# Patient Record
Sex: Female | Born: 1935 | Race: White | Hispanic: No | Smoking: Never smoker
Health system: Southern US, Community
[De-identification: ages and names within clinical notes are randomized; demographics above are authoritative.]

## PROBLEM LIST (undated history)

## (undated) DIAGNOSIS — I10 Essential (primary) hypertension: Secondary | ICD-10-CM

## (undated) HISTORY — PX: ABDOMINAL SURGERY: SHX537

## (undated) HISTORY — DX: Essential (primary) hypertension: I10

---

## 2004-09-26 ENCOUNTER — Emergency Department: Payer: Self-pay | Admitting: Emergency Medicine

## 2004-09-29 ENCOUNTER — Ambulatory Visit: Payer: Self-pay | Admitting: Emergency Medicine

## 2004-10-29 ENCOUNTER — Ambulatory Visit: Payer: Self-pay | Admitting: Gastroenterology

## 2004-12-24 ENCOUNTER — Ambulatory Visit: Payer: Self-pay | Admitting: Family Medicine

## 2005-01-09 ENCOUNTER — Ambulatory Visit: Payer: Self-pay | Admitting: Family Medicine

## 2005-11-25 ENCOUNTER — Ambulatory Visit: Payer: Self-pay | Admitting: Nurse Practitioner

## 2012-10-19 ENCOUNTER — Ambulatory Visit: Payer: Self-pay | Admitting: Ophthalmology

## 2012-10-31 ENCOUNTER — Ambulatory Visit: Payer: Self-pay | Admitting: Ophthalmology

## 2012-11-07 ENCOUNTER — Ambulatory Visit: Payer: Self-pay | Admitting: Ophthalmology

## 2014-10-24 ENCOUNTER — Ambulatory Visit
Admit: 2014-10-24 | Disposition: A | Discharge status: Home / Self Care | Payer: Self-pay | Attending: Nurse Practitioner | Admitting: Nurse Practitioner

## 2014-10-26 NOTE — Op Note (Signed)
PATIENT NAME:  Shawna GalaMASSEY, [name] MR#:  [id number] DATE OF BIRTH:  1927-12-25  DATE OF PROCEDURE:  11/07/2012  PREOPERATIVE DIAGNOSIS: Retained lens fragment, right eye.   POSTOPERATIVE DIAGNOSIS: Retained lens fragment, right eye.   PROCEDURE PERFORMED: Removal of lens fragment, right eye.   SURGEON: Maylon PeppersSteven A. Jaylynn Siefert, MD  ANESTHESIA: 4% lidocaine and 0.75% Marcaine in a 50-50 mixture with 10 units/mL of Hylenex added, given as a peribulbar.   ANESTHESIOLOGIST: Dr. Dimple Caseyice.   COMPLICATIONS: None.   ESTIMATED BLOOD LOSS: Less than 1 mL.   DESCRIPTION OF PROCEDURE: The patient was brought to the operating room and given IV sedation and a peribulbar block. She was then prepped and draped in the usual fashion. Vertical rectus muscles were imbricated using 5-0 silk sutures, bridle sutures. A paracentesis entry was made superonasally with paracentesis knife and the wound was dissected using a Barraquer sweep.  The eye handpiece was then placed into the eye and the fragment came to the tip. The Barraquer sweep was used through the paracentesis track to break the fragment into small pieces, which were evacuated through the eye handpiece. The wound was then inflated with balanced salt and tenth of a milliliter of cefuroxime was injected deep in the chamber along with balanced salt that had been placed previously. The wound was checked for leaks, none were found. The bridle sutures were removed. Two drops of Vigamox were placed in the eye. A shield was placed on the eye. The patient was discharged to the recovery area in good condition.  ____________________________ Maylon PeppersSteven A. Ranbir Chew, MD sad:sb D: 11/07/2012 14:06:14 ET    T: 11/07/2012 14:24:49 ET        JOB#: [id number] cc: Viviann SpareSteven A. Cortnee Steinmiller, MD, <Dictator> Erline LevineSTEVEN A Sol Odor MD ELECTRONICALLY SIGNED 11/14/2012 14:41

## 2014-10-26 NOTE — Op Note (Signed)
PATIENT NAME:  [name], Shawna J MR#:  [id number] DATE OF BIRTH:  Oct 13, 1927  DATE OF SURGERY:  10/31/2012  PREOPERATIVE DIAGNOSIS: Cataract, right eye.   POSTOPERATIVE DIAGNOSIS: Cataract, right eye.   PROCEDURE PERFORMED: Extracapsular cataract extraction using phacoemulsification with placement of an Alcon [id number], 21.0-diopter posterior chamber lens, serial [id number].   ANESTHESIA:  4% lidocaine and 0.75% Marcaine in a 50:50 mixture of 10 units per mL of Hylenex added, given as a peribulbar.  ANESTHESIOLOGIST:  Dr. Pernell DupreAdams.  COMPLICATIONS:  None.  ESTIMATED BLOOD LOSS:  Less than 1 mL.  DESCRIPTION OF PROCEDURE:  The patient was brought to the operating room and given a peribulbar block.  The patient was then prepped and draped in the usual fashion.  The vertical rectus muscles were imbricated using 5-0 silk sutures.  These sutures were then clamped to the sterile drapes as bridle sutures.  A limbal peritomy was performed extending two clock hours and hemostasis was obtained with cautery.  A partial-thickness scleral groove was made at the surgical limbus and dissected anteriorly in a lamellar dissection using an Alcon crescent knife.  The anterior chamber was entered superonasally with a Superblade and through the lamellar dissection with a 2.6 mm keratome.  DisCoVisc was used to replace the aqueous and a continuous tear capsulorrhexis was carried out.  Hydrodissection and hydrodelineation were carried out with balanced salt and a 27 gauge cannula.  The nucleus was rotated to confirm the effectiveness of the hydrodissection.  Phacoemulsification was carried out using a divide-and-conquer technique.  Total ultrasound time was 1 minute and 47 seconds, with an average power of 26.3%, CDE of 43.17.  Irrigation/aspiration was used to remove the residual cortex.  DisCoVisc was used to inflate the capsule and the internal incision was enlarged to 3 mm with the crescent knife.  The intraocular  lens was folded and inserted into the capsular bag using the AcrySert delivery system instead of the [employer] shooter.  Irrigation/aspiration was used to remove the residual DisCoVisc.   There was no antibiotic injected due to a PENICILLIN ALLERGY.  Miostat was injected into the anterior chamber through the paracentesis track to inflate the anterior chamber and induce miosis.  The wound was checked for leaks and none were found. The conjunctiva was closed with cautery and the bridle sutures were removed.  Two drops of 0.3% Vigamox were placed on the eye.   An eye shield was placed on the eye.     ____________________________ Shawna PeppersSteven A. Riely Baskett, MD sad:dm D: 10/31/2012 14:14:21 ET T: 10/31/2012 15:33:38 ET JOB#: [id number]  cc: Shawna SpareSteven A. Rihan Schueler, MD, <Dictator> Erline LevineSTEVEN A Zavian Slowey MD ELECTRONICALLY SIGNED 11/07/2012 9:48

## 2015-02-21 ENCOUNTER — Other Ambulatory Visit: Payer: Self-pay | Admitting: Nurse Practitioner

## 2015-02-21 DIAGNOSIS — M25512 Pain in left shoulder: Secondary | ICD-10-CM

## 2015-02-26 ENCOUNTER — Ambulatory Visit: Payer: Medicare Other

## 2015-02-28 ENCOUNTER — Ambulatory Visit
Admission: RE | Admit: 2015-02-28 | Discharge: 2015-02-28 | Disposition: A | Discharge status: Home / Self Care | Payer: Medicare Other | Source: Ambulatory Visit | Attending: Nurse Practitioner | Admitting: Nurse Practitioner

## 2015-02-28 DIAGNOSIS — M7552 Bursitis of left shoulder: Secondary | ICD-10-CM | POA: Insufficient documentation

## 2015-02-28 DIAGNOSIS — M75122 Complete rotator cuff tear or rupture of left shoulder, not specified as traumatic: Secondary | ICD-10-CM | POA: Diagnosis not present

## 2015-02-28 DIAGNOSIS — M25512 Pain in left shoulder: Secondary | ICD-10-CM | POA: Insufficient documentation

## 2015-02-28 DIAGNOSIS — M19012 Primary osteoarthritis, left shoulder: Secondary | ICD-10-CM | POA: Diagnosis not present

## 2020-02-29 ENCOUNTER — Other Ambulatory Visit: Payer: Self-pay

## 2020-02-29 ENCOUNTER — Emergency Department: Payer: Medicare Other

## 2020-02-29 ENCOUNTER — Emergency Department
Admission: EM | Admit: 2020-02-29 | Discharge: 2020-02-29 | Disposition: A | Discharge status: Home / Self Care | Payer: Medicare Other | Attending: Emergency Medicine | Admitting: Emergency Medicine

## 2020-02-29 DIAGNOSIS — U071 COVID-19: Secondary | ICD-10-CM | POA: Diagnosis not present

## 2020-02-29 DIAGNOSIS — I10 Essential (primary) hypertension: Secondary | ICD-10-CM | POA: Insufficient documentation

## 2020-02-29 DIAGNOSIS — R05 Cough: Secondary | ICD-10-CM | POA: Diagnosis present

## 2020-02-29 LAB — COMPREHENSIVE METABOLIC PANEL
ALT: 19 U/L (ref 0–44)
AST: 27 U/L (ref 15–41)
Albumin: 3.2 g/dL — ABNORMAL LOW (ref 3.5–5.0)
Alkaline Phosphatase: 61 U/L (ref 38–126)
Anion gap: 16 — ABNORMAL HIGH (ref 5–15)
BUN: 15 mg/dL (ref 8–23)
CO2: 27 mmol/L (ref 22–32)
Calcium: 8.9 mg/dL (ref 8.9–10.3)
Chloride: 94 mmol/L — ABNORMAL LOW (ref 98–111)
Creatinine, Ser: 0.83 mg/dL (ref 0.44–1.00)
GFR calc Af Amer: >60 mL/min (ref >60)
GFR calc non Af Amer: >60 mL/min (ref >60)
Glucose, Bld: 130 mg/dL — ABNORMAL HIGH (ref 70–99)
Potassium: 3.1 mmol/L — ABNORMAL LOW (ref 3.5–5.1)
Sodium: 137 mmol/L (ref 135–145)
Total Bilirubin: 1.1 mg/dL (ref 0.3–1.2)
Total Protein: 8 g/dL (ref 6.5–8.1)

## 2020-02-29 LAB — CBC WITH DIFFERENTIAL/PLATELET
Abs Immature Granulocytes: 0.08 10*3/uL — ABNORMAL HIGH (ref 0.00–0.07)
Basophils Absolute: 0 10*3/uL (ref 0.0–0.1)
Basophils Relative: 0 %
Eosinophils Absolute: 0.1 10*3/uL (ref 0.0–0.5)
Eosinophils Relative: 1 %
HCT: 33.4 % — ABNORMAL LOW (ref 36.0–46.0)
Hemoglobin: 11 g/dL — ABNORMAL LOW (ref 12.0–15.0)
Immature Granulocytes: 1 %
Lymphocytes Relative: 9 %
Lymphs Abs: 0.8 10*3/uL (ref 0.7–4.0)
MCH: 27.9 pg (ref 26.0–34.0)
MCHC: 32.9 g/dL (ref 30.0–36.0)
MCV: 84.8 fL (ref 80.0–100.0)
Monocytes Absolute: 0.6 10*3/uL (ref 0.1–1.0)
Monocytes Relative: 7 %
Neutro Abs: 7.3 10*3/uL (ref 1.7–7.7)
Neutrophils Relative %: 82 %
Platelets: 541 10*3/uL — ABNORMAL HIGH (ref 150–400)
RBC: 3.94 MIL/uL (ref 3.87–5.11)
RDW: 12.2 % (ref 11.5–15.5)
WBC: 9 10*3/uL (ref 4.0–10.5)
nRBC: 0 % (ref 0.0–0.2)

## 2020-02-29 LAB — TROPONIN I (HIGH SENSITIVITY): Troponin I (High Sensitivity): 12 ng/L (ref <18)

## 2020-02-29 LAB — SARS CORONAVIRUS 2 BY RT PCR (HOSPITAL ORDER, PERFORMED IN ~~LOC~~ HOSPITAL LAB): SARS Coronavirus 2: POSITIVE — AB

## 2020-02-29 MED ORDER — DOXYCYCLINE MONOHYDRATE 100 MG PO TABS: 100.0000 mg | ORAL_TABLET | Freq: Two times a day (BID) | ORAL | 0 refills | Status: AC

## 2020-02-29 MED ORDER — SODIUM CHLORIDE 0.9 % IV SOLN
1.0000 g | Freq: Once | INTRAVENOUS | Status: AC
  Administered 2020-02-29: 1 g via INTRAVENOUS
  Filled 2020-02-29: qty 10

## 2020-02-29 MED ORDER — ALBUTEROL SULFATE HFA 108 (90 BASE) MCG/ACT IN AERS: 2.0000 | INHALATION_SPRAY | Freq: Four times a day (QID) | RESPIRATORY_TRACT | 2 refills | Status: DC | PRN

## 2020-02-29 MED ORDER — AMOXICILLIN-POT CLAVULANATE 875-125 MG PO TABS: 1.0000 | ORAL_TABLET | Freq: Two times a day (BID) | ORAL | 0 refills | Status: AC

## 2020-02-29 NOTE — ED Provider Notes (Signed)
Emergency Department Provider Note  ____________________________________________  Time seen: Approximately 8:19 PM  I have reviewed the triage vital signs and the nursing notes.   HISTORY  Chief Complaint Cough   Historian Patient     HPI Shawna Hopkins is a 86 y.o. female with a history of hypertension, presents to the emergency department with nonproductive cough and body aches for the past 2 weeks.  Patient states that her son recently had Covid.  She states that she has had some diarrhea but no vomiting.  She states that she only experiences body aches with cough.  She states that she feels like her cough is improving.  She denies fever and chills at home.  No current chest pain, chest tightness or abdominal pain.   Past Medical History:  Diagnosis Date   Hypertension      Immunizations up to date:  Yes.     Past Medical History:  Diagnosis Date   Hypertension     There are no problems to display for this patient.   Past Surgical History:  Procedure Laterality Date   ABDOMINAL SURGERY     Galbladder    Prior to Admission medications   Medication Sig Start Date End Date Taking? Authorizing Provider  albuterol (VENTOLIN HFA) 108 (90 Base) MCG/ACT inhaler Inhale 2 puffs into the lungs every 6 (six) hours as needed for wheezing or shortness of breath. 02/29/20   Orvil Feil, PA-C  amoxicillin-clavulanate (AUGMENTIN) 875-125 MG tablet Take 1 tablet by mouth 2 (two) times daily for 10 days. 02/29/20 03/10/20  Orvil Feil, PA-C  doxycycline (ADOXA) 100 MG tablet Take 1 tablet (100 mg total) by mouth 2 (two) times daily for 7 days. 02/29/20 03/07/20  Orvil Feil, PA-C    Allergies Patient has no known allergies.  No family history on file.  Social History Social History   Tobacco Use   Smoking status: Never Smoker   Smokeless tobacco: Never Used  Substance Use Topics   Alcohol use: Not Currently   Drug use: Never     Review of  Systems  Constitutional: No fever/chills Eyes:  No discharge ENT: No upper respiratory complaints. Respiratory: Patient has cough.  Gastrointestinal:   No nausea, no vomiting.  No diarrhea.  No constipation. Musculoskeletal: Patient has body aches.  Skin: Negative for rash, abrasions, lacerations, ecchymosis.    ____________________________________________   PHYSICAL EXAM:  VITAL SIGNS: ED Triage Vitals  Enc Vitals Group     BP 02/29/20 1544 139/77     Pulse Rate 02/29/20 1544 78     Resp 02/29/20 1544 20     Temp 02/29/20 1544 98.3 F (36.8 C)     Temp Source 02/29/20 1544 Oral     SpO2 02/29/20 1544 94 %     Weight 02/29/20 1545 130 lb (59 kg)     Height 02/29/20 1545 5\' 5"  (1.651 m)     Head Circumference --      Peak Flow --      Pain Score 02/29/20 1545 0     Pain Loc --      Pain Edu? --      Excl. in GC? --      Constitutional: Alert and oriented. Patient is lying supine. Eyes: Conjunctivae are normal. PERRL. EOMI. Head: Atraumatic. ENT:      Ears: Tympanic membranes are mildly injected with mild effusion bilaterally.       Nose: No congestion/rhinnorhea.      Mouth/Throat: Mucous  membranes are moist. Posterior pharynx is mildly erythematous.  Hematological/Lymphatic/Immunilogical: No cervical lymphadenopathy.  Cardiovascular: Normal rate, regular rhythm. Normal S1 and S2.  Good peripheral circulation. Respiratory: Normal respiratory effort without tachypnea or retractions. Lungs CTAB. Good air entry to the bases with no decreased or absent breath sounds. Gastrointestinal: Bowel sounds 4 quadrants. Soft and nontender to palpation. No guarding or rigidity. No palpable masses. No distention. No CVA tenderness. Musculoskeletal: Full range of motion to all extremities. No gross deformities appreciated. Neurologic:  Normal speech and language. No gross focal neurologic deficits are appreciated.  Skin:  Skin is warm, dry and intact. No rash noted. Psychiatric:  Mood and affect are normal. Speech and behavior are normal. Patient exhibits appropriate insight and judgement.    ____________________________________________   LABS (all labs ordered are listed, but only abnormal results are displayed)  Labs Reviewed  SARS CORONAVIRUS 2 BY RT PCR (HOSPITAL ORDER, PERFORMED IN [facility]) - Abnormal; Notable for the following components:      Result Value   SARS Coronavirus 2 POSITIVE (*)    All other components within normal limits  CBC WITH DIFFERENTIAL/PLATELET - Abnormal; Notable for the following components:   Hemoglobin 11.0 (*)    HCT 33.4 (*)    Platelets 541 (*)    Abs Immature Granulocytes 0.08 (*)    All other components within normal limits  COMPREHENSIVE METABOLIC PANEL - Abnormal; Notable for the following components:   Potassium 3.1 (*)    Chloride 94 (*)    Glucose, Bld 130 (*)    Albumin 3.2 (*)    Anion gap 16 (*)    All other components within normal limits  URINALYSIS, COMPLETE (UACMP) WITH MICROSCOPIC  TROPONIN I (HIGH SENSITIVITY)  TROPONIN I (HIGH SENSITIVITY)   ____________________________________________  EKG   ____________________________________________  RADIOLOGY Geraldo Pitter, personally viewed and evaluated these images (plain radiographs) as part of my medical decision making, as well as reviewing the written report by the radiologist.  DG Chest 1 View  Result Date: 02/29/2020 CLINICAL DATA:  Cough, fever EXAM: CHEST  1 VIEW COMPARISON:  None. FINDINGS: Heart is normal size. Patchy bilateral mid and lower lung airspace opacities compatible with pneumonia. No effusions. No acute bony abnormality. Aortic atherosclerosis. IMPRESSION: Patchy bilateral mid and lower lung airspace opacities compatible with pneumonia. Electronically Signed   By: Charlett Nose M.D.   On: 02/29/2020 17:16    ____________________________________________    PROCEDURES  Procedure(s) performed:      Procedures     Medications  cefTRIAXone (ROCEPHIN) 1 g in sodium chloride 0.9 % 100 mL IVPB (0 g Intravenous Stopped 02/29/20 2217)     ____________________________________________   INITIAL IMPRESSION / ASSESSMENT AND PLAN / ED COURSE  Pertinent labs & imaging results that were available during my care of the patient were reviewed by me and considered in my medical decision making (see chart for details).      Assessment and plan:  Cough:  Body aches:  86 year old female presents to the emergency department with persistent cough for the past 2 weeks.  Vital signs were reassuring at triage.  On physical exam, patient was alert and active.  She had no increased work of breathing and no adventitious lung sounds were auscultated.  CBC and CMP were reassuring without leukocytosis.  Troponin was within reference range.  EKG revealed no apparent arrhythmia or ST segment elevation.  Patient did test positive for COVID-19.  I discussed patient's case with  my attending, Dr. Erma Heritage who personally evaluated patient in the emergency department.  We had patient undergo an ambulatory pulse ox and patient was satting at 96% on room air with ambulation.  Patient was given IV Rocephin in the emergency department given patchy bilateral opacities visualized on chest x-ray which is concerning for post viral pneumonia given long duration of cough for the past 2 weeks.  Patient was discharged with doxycycline and Augmentin.  She was advised to complete her course of amoxicillin as directed by her primary care provider and then to start aforementioned medications prescribed during this emergency department encounter.  She was also prescribed an albuterol inhaler with recommendations to take 2 puffs every 4 hours as needed for shortness of breath.  Return precautions were given to return to the emergency department with new or worsening symptoms.  She voiced understanding and has easy access to the  emergency department should her symptoms change.   ____________________________________________  FINAL CLINICAL IMPRESSION(S) / ED DIAGNOSES  Final diagnoses:  COVID-19      NEW MEDICATIONS STARTED DURING THIS VISIT:  ED Discharge Orders         Ordered    doxycycline (ADOXA) 100 MG tablet  2 times daily        02/29/20 2244    amoxicillin-clavulanate (AUGMENTIN) 875-125 MG tablet  2 times daily        02/29/20 2244    albuterol (VENTOLIN HFA) 108 (90 Base) MCG/ACT inhaler  Every 6 hours PRN        02/29/20 2244              This chart was dictated using voice recognition software/Dragon. Despite best efforts to proofread, errors can occur which can change the meaning. Any change was purely unintentional.     Orvil Feil, PA-C 02/29/20 2311    Shaune Pollack, MD 03/05/20 [phone]

## 2020-02-29 NOTE — ED Notes (Signed)
Pt ambulated with pulse ox with no difficulty. Pt O2 sat started at 97% and stayed above 93% while ambulating.

## 2020-02-29 NOTE — ED Provider Notes (Signed)
EKG: Normal sinus rhythm, VR 74. PR 166, QRS 88, QTc 452. Sinus rhythm with PVCs. No ST elevations or depressions.   Shaune Pollack, MD 02/29/20 2042

## 2020-02-29 NOTE — Discharge Instructions (Signed)
You will take amoxicillin until completion. The next day, you will take doxycycline twice daily for the next 7 days. You will take Augmentin twice daily for the next 7 days. You can use 2 puffs of albuterol every 4 hours as needed for shortness of breath. Please return to the emergency department with new or worsening symptoms.

## 2020-02-29 NOTE — ED Notes (Signed)
Grandson called and asked staff to call him when his grandmother was discharged so he could come get her.

## 2020-02-29 NOTE — ED Triage Notes (Signed)
Pt states he has been having flu like symptoms since the 9th but then her son came to visit and he had covid- pt states she has had both her shots- pt has been taking amoxicillin and cough medicine for bronchitis

## 2021-06-28 ENCOUNTER — Inpatient Hospital Stay: Payer: Medicare Other

## 2021-06-28 ENCOUNTER — Inpatient Hospital Stay
Admission: EM | Admit: 2021-06-28 | Discharge: 2021-06-29 | DRG: 065 | Disposition: A | Discharge status: Other Acute Inpatient Hospital | Payer: Medicare Other | Attending: Internal Medicine | Admitting: Internal Medicine

## 2021-06-28 ENCOUNTER — Emergency Department: Payer: Medicare Other

## 2021-06-28 DIAGNOSIS — Z20822 Contact with and (suspected) exposure to covid-19: Secondary | ICD-10-CM | POA: Diagnosis present

## 2021-06-28 DIAGNOSIS — I10 Essential (primary) hypertension: Secondary | ICD-10-CM | POA: Diagnosis present

## 2021-06-28 DIAGNOSIS — R29714 NIHSS score 14: Secondary | ICD-10-CM | POA: Diagnosis present

## 2021-06-28 DIAGNOSIS — Z66 Do not resuscitate: Secondary | ICD-10-CM | POA: Diagnosis present

## 2021-06-28 DIAGNOSIS — R531 Weakness: Secondary | ICD-10-CM | POA: Diagnosis present

## 2021-06-28 DIAGNOSIS — I63512 Cerebral infarction due to unspecified occlusion or stenosis of left middle cerebral artery: Secondary | ICD-10-CM | POA: Diagnosis present

## 2021-06-28 DIAGNOSIS — I4891 Unspecified atrial fibrillation: Secondary | ICD-10-CM | POA: Diagnosis present

## 2021-06-28 DIAGNOSIS — Z7902 Long term (current) use of antithrombotics/antiplatelets: Secondary | ICD-10-CM | POA: Diagnosis not present

## 2021-06-28 DIAGNOSIS — R000 Tachycardia, unspecified: Secondary | ICD-10-CM | POA: Diagnosis present

## 2021-06-28 DIAGNOSIS — I619 Nontraumatic intracerebral hemorrhage, unspecified: Secondary | ICD-10-CM | POA: Diagnosis not present

## 2021-06-28 DIAGNOSIS — I639 Cerebral infarction, unspecified: Secondary | ICD-10-CM | POA: Diagnosis not present

## 2021-06-28 DIAGNOSIS — Z7982 Long term (current) use of aspirin: Secondary | ICD-10-CM | POA: Diagnosis not present

## 2021-06-28 DIAGNOSIS — R4701 Aphasia: Secondary | ICD-10-CM | POA: Diagnosis present

## 2021-06-28 DIAGNOSIS — I69351 Hemiplegia and hemiparesis following cerebral infarction affecting right dominant side: Secondary | ICD-10-CM | POA: Diagnosis not present

## 2021-06-28 DIAGNOSIS — I6389 Other cerebral infarction: Secondary | ICD-10-CM

## 2021-06-28 LAB — URINE DRUG SCREEN, QUALITATIVE (ARMC ONLY)
Amphetamines, Ur Screen: NOT DETECTED
Barbiturates, Ur Screen: NOT DETECTED
Benzodiazepine, Ur Scrn: NOT DETECTED
Cannabinoid 50 Ng, Ur ~~LOC~~: NOT DETECTED
Cocaine Metabolite,Ur ~~LOC~~: NOT DETECTED
MDMA (Ecstasy)Ur Screen: NOT DETECTED
Methadone Scn, Ur: NOT DETECTED
Opiate, Ur Screen: NOT DETECTED
Phencyclidine (PCP) Ur S: NOT DETECTED
Tricyclic, Ur Screen: NOT DETECTED

## 2021-06-28 LAB — URINALYSIS, ROUTINE W REFLEX MICROSCOPIC
Bilirubin Urine: NEGATIVE
Glucose, UA: NEGATIVE mg/dL
Ketones, ur: 40 mg/dL — AB
Nitrite: NEGATIVE
Protein, ur: 30 mg/dL — AB
Specific Gravity, Urine: 1.015 (ref 1.005–1.030)
pH: 7 (ref 5.0–8.0)

## 2021-06-28 LAB — ETHANOL: Alcohol, Ethyl (B): <10 mg/dL

## 2021-06-28 LAB — CBC
HCT: 34.9 % — ABNORMAL LOW (ref 36.0–46.0)
Hemoglobin: 11.5 g/dL — ABNORMAL LOW (ref 12.0–15.0)
MCH: 29 pg (ref 26.0–34.0)
MCHC: 33 g/dL (ref 30.0–36.0)
MCV: 87.9 fL (ref 80.0–100.0)
Platelets: 230 K/uL (ref 150–400)
RBC: 3.97 MIL/uL (ref 3.87–5.11)
RDW: 11.9 % (ref 11.5–15.5)
WBC: 10.5 K/uL (ref 4.0–10.5)
nRBC: 0 % (ref 0.0–0.2)

## 2021-06-28 LAB — COMPREHENSIVE METABOLIC PANEL
ALT: 13 U/L (ref 0–44)
AST: 23 U/L (ref 15–41)
Albumin: 3.7 g/dL (ref 3.5–5.0)
Alkaline Phosphatase: 59 U/L (ref 38–126)
Anion gap: 10 (ref 5–15)
BUN: 24 mg/dL — ABNORMAL HIGH (ref 8–23)
CO2: 23 mmol/L (ref 22–32)
Calcium: 9 mg/dL (ref 8.9–10.3)
Chloride: 99 mmol/L (ref 98–111)
Creatinine, Ser: 0.93 mg/dL (ref 0.44–1.00)
GFR, Estimated: 58 mL/min — ABNORMAL LOW (ref >60)
Glucose, Bld: 143 mg/dL — ABNORMAL HIGH (ref 70–99)
Potassium: 3.4 mmol/L — ABNORMAL LOW (ref 3.5–5.1)
Sodium: 132 mmol/L — ABNORMAL LOW (ref 135–145)
Total Bilirubin: 0.9 mg/dL (ref 0.3–1.2)
Total Protein: 6.8 g/dL (ref 6.5–8.1)

## 2021-06-28 LAB — CBG MONITORING, ED: Glucose-Capillary: 133 mg/dL — ABNORMAL HIGH (ref 70–99)

## 2021-06-28 LAB — DIFFERENTIAL
Abs Immature Granulocytes: 0.07 10*3/uL (ref 0.00–0.07)
Basophils Absolute: 0 10*3/uL (ref 0.0–0.1)
Basophils Relative: 0 %
Eosinophils Absolute: 0 10*3/uL (ref 0.0–0.5)
Eosinophils Relative: 0 %
Immature Granulocytes: 1 %
Lymphocytes Relative: 5 %
Lymphs Abs: 0.5 10*3/uL — ABNORMAL LOW (ref 0.7–4.0)
Monocytes Absolute: 0.4 10*3/uL (ref 0.1–1.0)
Monocytes Relative: 4 %
Neutro Abs: 9.5 10*3/uL — ABNORMAL HIGH (ref 1.7–7.7)
Neutrophils Relative %: 90 %

## 2021-06-28 LAB — APTT: aPTT: 31 seconds (ref 24–36)

## 2021-06-28 LAB — RESP PANEL BY RT-PCR (FLU A&B, COVID) ARPGX2
Influenza A by PCR: NEGATIVE
Influenza B by PCR: NEGATIVE
SARS Coronavirus 2 by RT PCR: NEGATIVE

## 2021-06-28 LAB — PROTIME-INR
INR: 1.1 (ref 0.8–1.2)
Prothrombin Time: 14.1 s (ref 11.4–15.2)

## 2021-06-28 LAB — URINALYSIS, MICROSCOPIC (REFLEX)

## 2021-06-28 MED ORDER — DILTIAZEM HCL 25 MG/5ML IV SOLN
10.0000 mg | Freq: Once | INTRAVENOUS | Status: DC
  Filled 2021-06-28: qty 5

## 2021-06-28 MED ORDER — SODIUM CHLORIDE 0.9 % IV SOLN: INTRAVENOUS | Status: DC

## 2021-06-28 MED ORDER — STROKE: EARLY STAGES OF RECOVERY BOOK: Freq: Once | Status: DC

## 2021-06-28 MED ORDER — ACETAMINOPHEN 160 MG/5ML PO SOLN
650.0000 mg | ORAL | Status: DC | PRN
  Filled 2021-06-28: qty 20.3

## 2021-06-28 MED ORDER — METOPROLOL TARTRATE 5 MG/5ML IV SOLN
2.5000 mg | Freq: Four times a day (QID) | INTRAVENOUS | Status: DC | PRN
Start: 2021-06-28 — End: 2021-06-28

## 2021-06-28 MED ORDER — PANTOPRAZOLE SODIUM 40 MG PO TBEC
40.0000 mg | DELAYED_RELEASE_TABLET | Freq: Every day | ORAL | Status: DC
  Filled 2021-06-28: qty 1

## 2021-06-28 MED ORDER — DILTIAZEM HCL 25 MG/5ML IV SOLN
5.0000 mg | Freq: Once | INTRAVENOUS | Status: AC
  Administered 2021-06-28: 16:00:00 5 mg via INTRAVENOUS

## 2021-06-28 MED ORDER — ONDANSETRON HCL 4 MG/2ML IJ SOLN
INTRAMUSCULAR | Status: AC
  Administered 2021-06-28: 16:00:00 4 mg
  Filled 2021-06-28: qty 2

## 2021-06-28 MED ORDER — LACTATED RINGERS IV BOLUS
1000.0000 mL | Freq: Once | INTRAVENOUS | Status: AC
  Administered 2021-06-28: 17:00:00 1000 mL via INTRAVENOUS

## 2021-06-28 MED ORDER — ASPIRIN 81 MG PO CHEW
324.0000 mg | CHEWABLE_TABLET | Freq: Once | ORAL | Status: DC
  Filled 2021-06-28: qty 4

## 2021-06-28 MED ORDER — ACETAMINOPHEN 325 MG PO TABS: 650.0000 mg | ORAL_TABLET | ORAL | Status: DC | PRN

## 2021-06-28 MED ORDER — DILTIAZEM HCL 25 MG/5ML IV SOLN: 5.0000 mg | INTRAVENOUS | Status: DC | PRN

## 2021-06-28 MED ORDER — HYDRALAZINE HCL 20 MG/ML IJ SOLN
10.0000 mg | INTRAMUSCULAR | Status: AC
  Administered 2021-06-28: 21:00:00 10 mg via INTRAVENOUS
  Filled 2021-06-28: qty 1

## 2021-06-28 MED ORDER — CLEVIDIPINE BUTYRATE 0.5 MG/ML IV EMUL: 0.0000 mg/h | INTRAVENOUS | Status: DC

## 2021-06-28 MED ORDER — SENNOSIDES-DOCUSATE SODIUM 8.6-50 MG PO TABS: 1.0000 | ORAL_TABLET | Freq: Every evening | ORAL | Status: DC | PRN

## 2021-06-28 MED ORDER — METOPROLOL TARTRATE 5 MG/5ML IV SOLN: 2.5000 mg | Freq: Four times a day (QID) | INTRAVENOUS | Status: DC | PRN

## 2021-06-28 MED ORDER — IOHEXOL 350 MG/ML SOLN
100.0000 mL | Freq: Once | INTRAVENOUS | Status: AC | PRN
  Administered 2021-06-28: 16:00:00 100 mL via INTRAVENOUS

## 2021-06-28 MED ORDER — ACETAMINOPHEN 325 MG RE SUPP: 650.0000 mg | RECTAL | Status: DC | PRN

## 2021-06-28 NOTE — H&P (Addendum)
History and Physical    MADISSEN Hopkins WNI:[id number] DOB: 10/25/1927 DOA: 06/28/2021  PCP: [facility]  Patient coming from: code stroke  I have personally briefly reviewed patient's old medical records in [facility]  Chief Complaint: found slumped over at home with right sided weakness , left gaze deviation,and speech difficulties  HPI: Shawna Hopkins is a 87 y.o. female with medical history significant of  HTN,who presents to ed BIB ems s/p being found slumped over in a chair by family this am. Per family LKW was 11pm the night prior. Patient lives alone and is able to perform all her adls and at baseline has good cognitive function without hx of MCI or dementia. This am when patient did not answer phone, family went to her home and found her slumped over with right sided deficits and difficulty speaking.  On evaluation in ed patient was found to have left MCA cva with LVO of  left distal M2/M3 branch. Plans were made to transfer patient but no capability at [facility] and [facility] neurointerventionalist did not accept transfer based on distal nature of occlusion and age of patient making for poor risk /benefit ratio. Patient was seen by Dr Darlyn Read who thereafter recommended medical management with permissive HTN, CVA protocol. Patient was also found to have new onset afib with rvr, she was treated with low dose ccb with good effect.  ED Course:  As noted above  Labs: K3.4,  UA-abn +rbc, wbc UDS negative  Respiratory panel neg Na 132, ws 137 Review of Systems: As per HPI otherwise 10 point review of systems negative.   Past Medical History:  Diagnosis Date   Hypertension     Past Surgical History:  Procedure Laterality Date   ABDOMINAL SURGERY     Galbladder     reports that she has never smoked. She has never used smokeless tobacco. She reports that she does not currently use alcohol. She reports that she does not use drugs.  No Known Allergies  No  family history on file. [place] Prior to Admission medications   Medication Sig Start Date End Date Taking? Authorizing Provider  hydrochlorothiazide (HYDRODIURIL) 25 MG tablet Take 25 mg by mouth daily. 04/14/21  Yes [provider]  omeprazole (PRILOSEC) 20 MG capsule Take 20 mg by mouth daily. 05/12/21  Yes [provider]  albuterol (VENTOLIN HFA) 108 (90 Base) MCG/ACT inhaler Inhale 2 puffs into the lungs every 6 (six) hours as needed for wheezing or shortness of breath. 02/29/20   Orvil Feil, PA-C    Physical Exam: Vitals:   06/28/21 1720 06/28/21 1725 06/28/21 1820 06/28/21 1840  BP: (!) 154/86 (!) 138/108 (!) 144/96 (!) 159/78  Pulse: 98 87 (!) 55 82  Resp: 17 15 20 17   Temp:  99 F (37.2 C)    TempSrc:  Axillary    SpO2: 99% 99% 99% 98%  Weight:        Constitutional: NAD, calm, comfortable Vitals:   06/28/21 1720 06/28/21 1725 06/28/21 1820 06/28/21 1840  BP: (!) 154/86 (!) 138/108 (!) 144/96 (!) 159/78  Pulse: 98 87 (!) 55 82  Resp: 17 15 20 17   Temp:  99 F (37.2 C)    TempSrc:  Axillary    SpO2: 99% 99% 99% 98%  Weight:       Eyes: PERRL, lids and conjunctivae normal ENMT: Mucous membranes are dry Posterior pharynx clear of any exudate or lesions. Neck: normal, supple, no masses,  no thyromegaly Respiratory: clear to auscultation bilaterally, no wheezing, no crackles. Normal respiratory effort. No accessory muscle use.  Cardiovascular: irRegular rhythm, tachycardic ,no murmurs / rubs / gallops. No extremity edema. 2+ pedal pulses. No carotid bruits.  Abdomen: no tenderness, no masses palpated. No hepatosplenomegaly. Bowel sounds positive.  Musculoskeletal: no clubbing / cyanosis. No joint deformity upper and lower extremities. Good ROM, no contractures. Normal muscle tone.  Skin: no rashes, lesions, ulcers. No induration Neurologic: CN 2-12 grossly intact. Unable to fully assess as patient does follows comands intermittently. No deviation of gaze  noted on my exam. Unable to assess Sensation, upper extremity Strength 4/5 lower extremity able to hold L and right leg up for 2 seconds, does not withdraw from noxious stimuli on left toe but does on right toe. Follow simple commands squeeze hand but notes decrease grip strength on right vs left . Patient is aphasic  Psychiatric: unable to assess, no noted anxiety    Labs on Admission: I have personally reviewed following labs and imaging studies  CBC: Recent Labs  Lab 06/28/21 1552  WBC 10.5  NEUTROABS 9.5*  HGB 11.5*  HCT 34.9*  MCV 87.9  PLT 230   Basic Metabolic Panel: Recent Labs  Lab 06/28/21 1552  NA 132*  K 3.4*  CL 99  CO2 23  GLUCOSE 143*  BUN 24*  CREATININE 0.93  CALCIUM 9.0   GFR: CrCl cannot be calculated (Unknown ideal weight.). Liver Function Tests: Recent Labs  Lab 06/28/21 1552  AST 23  ALT 13  ALKPHOS 59  BILITOT 0.9  PROT 6.8  ALBUMIN 3.7   No results for input(s): LIPASE, AMYLASE in the last 168 hours. No results for input(s): AMMONIA in the last 168 hours. Coagulation Profile: Recent Labs  Lab 06/28/21 1552  INR 1.1   Cardiac Enzymes: No results for input(s): CKTOTAL, CKMB, CKMBINDEX, TROPONINI in the last 168 hours. BNP (last 3 results) No results for input(s): PROBNP in the last 8760 hours. HbA1C: No results for input(s): HGBA1C in the last 72 hours. CBG: Recent Labs  Lab 06/28/21 1519  GLUCAP 133*   Lipid Profile: No results for input(s): CHOL, HDL, LDLCALC, TRIG, CHOLHDL, LDLDIRECT in the last 72 hours. Thyroid Function Tests: No results for input(s): TSH, T4TOTAL, FREET4, T3FREE, THYROIDAB in the last 72 hours. Anemia Panel: No results for input(s): VITAMINB12, FOLATE, FERRITIN, TIBC, IRON, RETICCTPCT in the last 72 hours. Urine analysis:    Component Value Date/Time   COLORURINE STRAW (A) 06/28/2021 1552   APPEARANCEUR CLEAR 06/28/2021 1552   LABSPEC 1.015 06/28/2021 1552   PHURINE 7.0 06/28/2021 1552   GLUCOSEU  NEGATIVE 06/28/2021 1552   HGBUR MODERATE (A) 06/28/2021 1552   BILIRUBINUR NEGATIVE 06/28/2021 1552   KETONESUR 40 (A) 06/28/2021 1552   PROTEINUR 30 (A) 06/28/2021 1552   NITRITE NEGATIVE 06/28/2021 1552   LEUKOCYTESUR SMALL (A) 06/28/2021 1552    Radiological Exams on Admission: DG Chest Portable 1 View  Result Date: 06/28/2021 CLINICAL DATA:  New onset atrial fibrillation.  Acute stroke. EXAM: PORTABLE CHEST 1 VIEW COMPARISON:  02/29/2020 FINDINGS: Heart size upper limits of normal. Calcification and tortuosity of the aorta. Mildly prominent interstitial markings in the mid and lower lungs, improved since the study of February 29, 2020 where there was quite likely acute pneumonia. Today's markings could be chronic or indicate mild active inflammatory disease. No consolidation or collapse. No effusion. Bony structures unremarkable. IMPRESSION: No consolidation, collapse, frank edema or effusion. Slightly prominent markings at the  lung bases which may be residua of previous pneumonia. Electronically Signed   By: Paulina Fusi M.D.   On: 06/28/2021 16:28   CT HEAD CODE STROKE WO CONTRAST  Result Date: 06/28/2021 CLINICAL DATA:  Code stroke. Provided history: Neuro deficit, acute, stroke suspected; right-sided weakness. Additional history provided: Aphasia, right-sided weakness. EXAM: CT HEAD WITHOUT CONTRAST TECHNIQUE: Contiguous axial images were obtained from the base of the skull through the vertex without intravenous contrast. COMPARISON:  No pertinent prior exams available for comparison. FINDINGS: Brain: Mild generalized cerebral and cerebellar atrophy. Abnormal cortical/subcortical hypodensity with loss of gray-white differentiation within portions of the left frontal operculum and left insula, compatible with acute left MCA territory infarct. No significant mass effect at this time. No evidence of hemorrhagic conversion. Background advanced patchy and confluent hypoattenuation within the  cerebral white matter, nonspecific but compatible with chronic small vessel ischemic disease. Small chronic infarcts within the basal ganglia and likely within the left cerebellar hemisphere. No extra-axial fluid collection. No evidence of an intracranial mass. No midline shift. Vascular: Asymmetric hyperdensity of a proximal M2 left MCA vessel suspicious for endoluminal thrombus at this site (for instance as seen on series 3, image 13). Atherosclerotic calcifications. Skull: Normal. Negative for fracture or focal lesion. Sinuses/Orbits: Leftward gaze. Trace mucosal thickening within the bilateral ethmoid air cells. ASPECTS [facility] Stroke Program Early CT Score) - Ganglionic level infarction (caudate, lentiform nuclei, internal capsule, insula, M1-M3 cortex): 5 - Supraganglionic infarction (M4-M6 cortex): 2 Total score (0-10 with 10 being normal): 7 These results were called by telephone at the time of interpretation on 06/28/2021 at 3:38 pm to provider Dr. Selina Cooley, who verbally acknowledged these results. IMPRESSION: Acute left MCA territory cortical/subcortical infarct within the left frontal operculum and left insula. ASPECTS is 7. No evidence of hemorrhagic conversion. No significant mass effect at this time. Background advanced chronic small vessel ischemic changes within the cerebral white matter. Chronic infarcts within the bilateral basal ganglia and likely left cerebellar hemisphere. Mild generalized parenchymal atrophy. Electronically Signed   By: Jackey Loge D.O.   On: 06/28/2021 15:41   CT ANGIO HEAD NECK W WO CM W PERF (CODE STROKE)  Result Date: 06/28/2021 CLINICAL DATA:  Neuro deficit, acute, stroke suspected. Additional history provided: Aphasia, right-sided weakness. EXAM: CT ANGIOGRAPHY HEAD AND NECK CT PERFUSION BRAIN TECHNIQUE: Multidetector CT imaging of the head and neck was performed using the standard protocol during bolus administration of intravenous contrast. Multiplanar CT image  reconstructions and MIPs were obtained to evaluate the vascular anatomy. Carotid stenosis measurements (when applicable) are obtained utilizing NASCET criteria, using the distal internal carotid diameter as the denominator. Multiphase CT imaging of the brain was performed following IV bolus contrast injection. Subsequent parametric perfusion maps were calculated using RAPID software. CONTRAST:  OMNIPAQUE IOHEXOL 350 MG/ML SOLN COMPARISON:  Noncontrast head CT performed earlier today 06/28/2021. FINDINGS: CTA NECK FINDINGS Aortic arch: Common origin of the innominate and left common carotid arteries. Atherosclerotic plaque within the visualized aortic arch and proximal major branch vessels of the neck. No hemodynamically significant innominate or proximal subclavian artery stenosis. Right carotid system: CCA and ICA patent within the neck without stenosis or significant atherosclerotic disease. Tortuosity of the cervical ICA. Left carotid system: CCA and ICA patent within the neck without hemodynamically significant stenosis (50% or greater). Minimal atherosclerotic plaque at the CCA origin and within the proximal ICA. Tortuosity of the cervical ICA. Vertebral arteries: Vertebral arteries codominant and patent within the neck. Mild nonstenotic atherosclerotic  plaque at the origin of the right vertebral artery. Skeleton: Trace C2-C3 grade 1 retrolisthesis. Trace C7-T1 grade 1 anterolisthesis. Cervical spondylosis. C3-C4 and C4-C5 ankylosis. No acute bony abnormality or aggressive osseous lesion. Other neck: Subcentimeter nodules within the right thyroid lobe, not meeting consensus criteria for ultrasound follow-up based on size. No cervical lymphadenopathy. Upper chest: No consolidation within the imaged lung apices. Borderline enlarged right tracheobronchial lymph node measuring 11 mm in short axis (series 9, image 2). Review of the MIP images confirms the above findings CTA HEAD FINDINGS Anterior circulation:  The intracranial internal carotid arteries are patent. Nonstenotic atherosclerotic plaque within both vessels. 6 x 6 mm inferomedially projecting saccular aneurysm arising from the distal cavernous/paraclinoid left ICA (for instance as seen on series 9, image 248) (series 12, image 116). Separate 2 mm inferiorly projecting vascular protrusion arising from the paraclinoid left ICA more laterally, which may reflect an infundibulum or additional aneurysm (for instance as seen on series 9, image 251) (series 12, image 120). The M1 middle cerebral arteries are patent. No right M2 proximal branch occlusion or high-grade proximal stenosis is identified. There is occlusion of a distal M2/proximal M3 left MCA vessel (series 14, image 31) (series 10, image 56). The anterior cerebral arteries are patent. Posterior circulation: The intracranial vertebral arteries are patent. The basilar artery is patent. The posterior cerebral arteries are patent. A right posterior communicating artery is present. The left posterior communicating artery is diminutive or absent. Venous sinuses: Within the limitations of contrast timing, no convincing thrombus. Anatomic variants: As described. Review of the MIP images confirms the above findings CT Brain Perfusion Findings: CBF (<30%) Volume: 39mL Perfusion (Tmax>6.0s) volume: 68mL (within the posterior left MCA vascular territory). Mismatch Volume: 70mL Infarction Location:None identified Emergent findings discussed with Dr. Selina Cooley at 4 p.m. on 06/28/2021 by telephone. IMPRESSION: CTA neck: 1. The common carotid, internal carotid and vertebral arteries are patent within the neck without significant stenosis. Mild atherosclerotic disease within the major arterial vessels of the neck, as described. 2.  Aortic Atherosclerosis (ICD10-I70.0). 3. Partially imaged borderline enlarged right tracheobronchial mediastinal lymph node, nonspecific. Non-emergent dedicated CT imaging of the chest may be obtained  for further evaluation, as clinically warranted. CTA head: 1. Occlusion of a distal M2/proximal M3 left MCA vessel. 2. Non-stenotic atherosclerotic plaque within the intracranial internal carotid arteries, bilaterally. 3. 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 4. Adjacent 2 mm aneurysm versus infundibulum arising from the paraclinoid left ICA. CT perfusion head: The perfusion software identifies a 10 mL region of critically hypoperfused parenchyma within the posterior left MCA vascular territory ( Tmax>6 seconds threshold). The perfusion software identifies no core infarct. However, there are clear changes of acute infarction on the concurrently performed noncontrast head CT. The perfusion software reports a mismatch volume of 10 mL. Electronically Signed   By: Jackey Loge D.O.   On: 06/28/2021 16:14    EKG: Independently reviewed. Afib with rvr  Assessment/Plan Acute Left MCA infarct with LVO of M3/M4 -currently not a candidate for thrombectomy -admit to progressive care place on cva protocol  -permissive HTN x 48 hours bp goal <220/110 - asa , statin  -echo in am  -PT/SLP/OT   New Onset afib  -rate controlled with small dose ccb  -low dose bb for if bp with hold parameters for bp 140 or less -asa for now  -can start anticoagulation in 7 days at which time  asa will be d/ced  HTN Uncontrolled  -presmissive htn as noted above -holding atni-htn meds currently     DVT prophylaxis: scd Code Status: DNR Family Communication: son(Melvin Yolunda Kloos [phone] Disposition Plan: progressive care patient  expected to be admitted greater than 2 midnights  Consults called: Dr Darlyn Read neuro [phone] Admission status: progressive care   Lurline Del MD [facility]   If 7PM-7AM, please contact night-coverage [email or web address] Password [facility]  06/28/2021, 6:46 PM

## 2021-06-28 NOTE — ED Notes (Signed)
Pt presents to ED via [facility] with concerns of code stroke. Pt lives independently at home and is verbal and A&Ox4 at baseline. Per family pt was seen and spike to last night at 2300 and was found to be normal.   Pt presents with R sided weakness, facial droop, and aphasia. Pt also has R sided vision neglect. Pt presents hypertensive with HX of same. Pt denies pain to this RN. Pt is able to shake head yes or no to questions but struggles with getting words out. Pt able to squeeze hands.   Pt LVO +  Family contacted.  Initial CBG 133, pt brought right to CT, Dr. Quinn Axe met this RN and pt at CT, 60 G placed for profusion study.

## 2021-06-28 NOTE — ED Notes (Signed)
Witnessed verbal consent from pt's grandson Shawna Hopkins to allow transfer to 

## 2021-06-28 NOTE — ED Notes (Signed)
Patient transported to MRI 

## 2021-06-28 NOTE — Progress Notes (Signed)
Patient discussed with [facility] neurointerventionalist who did not accept patient for transfer because he felt the occlusion was too distal to be accessed with an appropriate risk/benefit ratio. Family updated. Will admit for stroke workup. Patient in a fib w/ RVR on arrival. When treating please exercise caution in avoiding hypotension if at all possible to reduce risk of stroke expansion.   Full consult note to follow.  Bing Neighbors, MD [facility] [phone]  If 7pm- 7am, please page neurology on call as listed in AMION.

## 2021-06-28 NOTE — Discharge Summary (Addendum)
History and Physical      Shawna Hopkins ELF:[id number] DOB: 22-Dec-1927 DOA: 06/28/2021   [facility]: [facility]    HPI: Shawna Hopkins is a 87 y.o. female with medical history significant of HTN,who presents to ED via  EMS after   being found slumped over in a chair by family this am. Per family last known well time was 11pm the night prior. Patient lives alone and is able to perform all her adls and at baseline has good cognitive function without hx of MCI or dementia. This am when patient did not answer phone, family went to her home and found her slumped over with right sided deficits and difficulty speaking.  On evaluation in ed patient was found to have left MCA cva with LVO of  left distal M2/M3 branch. Plans were made to transfer patient but no capability at [facility] and [facility] neurointerventionalist did not accept transfer based on distal nature of occlusion and age of patient making for poor risk /benefit ratio. Patient was seen by Dr Darlyn Read who thereafter recommended medical management with permissive HTN, CVA protocol. Patient was also found to have new onset A fib with RVR, she was treated with low dose ccb with good effect.  MRI with evidence of hemorrhagic conversion with mild local mass effect Report of hemorrhage provided to me from Dr. Derry Lory  request to transfer to [facility] in [place] with [facility] service in the ICU For BP control goal one dose hydralazine given and order for clavidipine infusion for tight control with goal of systolic below 160; Aspirin has been discontinued. No other anticoagulants ordered Discussed woth grandson over phone regarding plan and he is in agreement          Past Medical History:  Diagnosis Date   Hypertension             Past Surgical History:  Procedure Laterality Date   ABDOMINAL SURGERY        Galbladder       No Known Allergies   No family history on file. [place]        Prior to Admission  medications   Medication Sig Start Date End Date Taking? Authorizing Provider  hydrochlorothiazide (HYDRODIURIL) 25 MG tablet Take 25 mg by mouth daily. 04/14/21   Yes [provider]  omeprazole (PRILOSEC) 20 MG capsule Take 20 mg by mouth daily. 05/12/21   Yes [provider]  albuterol (VENTOLIN HFA) 108 (90 Base) MCG/ACT inhaler Inhale 2 puffs into the lungs every 6 (six) hours as needed for wheezing or shortness of breath. 02/29/20     Pia Mau M, PA-C    Vitals:   06/28/21 1820 06/28/21 1840 06/28/21 1900 06/28/21 2033  BP: (!) 144/96 (!) 159/78 (!) 167/112 (!) 160/68  Pulse: (!) 55 82 (!) 120   Temp:      Resp: 20 17 14    Weight:      SpO2: 99% 98% 97%   TempSrc:        Exam Eyes: PERRL, lids and conjunctivae normal ENMT: Mucous membranes are dry Neck: normal, supple, no masses, no thyromegaly Respiratory: clear to auscultation bilaterally, no wheezing, no crackles. Normal respiratory effort. No accessory muscle use.  Cardiovascular: atrial fib with rate 80-90, no murmurs / rubs / gallops. No extremity edema. 2+ pedal pulses. No carotid bruits.  Abdomen: no tenderness, no masses palpated. No hepatosplenomegaly. Bowel sounds positive.  Musculoskeletal: no clubbing / cyanosis. No joint deformity  upper and lower extremities. Good ROM, no contractures. Normal muscle tone.  Skin: no rashes, lesions, ulcers. No induration Neurologic: . Patient is aphasic, Able to follow most commands and nod to some questions. Left preferred gaze. Right upper and right lower extremity with partial drift but did not go to bed. Unable to assess sensation at this time  Psychiatric: unable to assess, no noted anxiety   Transfer to [facility] necessary for optimal services to monitor and treat intracerebral hemorrhage Manuela Schwartz NP [facility]

## 2021-06-28 NOTE — Progress Notes (Signed)
Chaplain Maggie responded to Code Stroke page to [facility]. Patient was unavailable. Contact on call chaplain for continued support.

## 2021-06-28 NOTE — ED Notes (Signed)
Family at bedside. Pt appears to be able to move all extremities at this time but is still having asphasia and not able to get her words out.

## 2021-06-28 NOTE — ED Triage Notes (Signed)
Pt to ED via [facility] from home for right sided weakness and aphasia. Pt Last known well last night at 11pm. Pt is verbal and ambulatory at baseline. Pt has notable weakness on the right side and aphasia. Pt is in NAD.

## 2021-06-28 NOTE — ED Provider Notes (Addendum)
[facility] Emergency Department Provider Note ____________________________________________   Event Date/Time   First MD Initiated Contact with Patient 06/28/21 1517     (approximate)  I have reviewed the triage vital signs and the nursing notes.  HISTORY  Chief Complaint Cerebrovascular Accident and Code Stroke   HPI Shawna Hopkins is a 87 y.o. femalewho presents to the ED for evaluation of right-sided weakness.  Chart review indicates HTN.   Patient presents from home via EMS for evaluation of right-sided weakness and aphasia.  Family spoke with her last night around 11 PM and she was reportedly at her baseline.  She was not picking up the phone earlier today, so they went to check on her and found her slumped down at the bottom of a recliner or couch.  She presents to the ED aphasic and unable to provide any relevant history.  She is normally ambulatory and conversational at baseline.  History is limited by this.  Past Medical History:  Diagnosis Date   Hypertension     There are no problems to display for this patient.   Past Surgical History:  Procedure Laterality Date   ABDOMINAL SURGERY     Galbladder    Prior to Admission medications   Medication Sig Start Date End Date Taking? Authorizing Provider  albuterol (VENTOLIN HFA) 108 (90 Base) MCG/ACT inhaler Inhale 2 puffs into the lungs every 6 (six) hours as needed for wheezing or shortness of breath. 02/29/20   Orvil Feil, PA-C    Allergies Patient has no known allergies.  No family history on file.  Social History Social History   Tobacco Use   Smoking status: Never   Smokeless tobacco: Never  Substance Use Topics   Alcohol use: Not Currently   Drug use: Never    Review of Systems  Unable to be accurately assessed due to aphasic patient. ____________________________________________   PHYSICAL EXAM:  VITAL SIGNS: Vitals:   06/28/21 1645 06/28/21 1705  BP: (!)  138/123 (!) 167/89  Pulse: (!) 36 80  Resp: 14 12  SpO2: 98% 98%     Constitutional: Alert and looking around without distress.  Follows simple commands in all 4 extremities for strength assessment, but has quite garbled speech. Eyes: Conjunctivae are normal.  Gaze deviation to the left.  Pupils midrange and PERRL. Head: Atraumatic. Nose: No congestion/rhinnorhea. Mouth/Throat: Mucous membranes are moist.  Oropharynx non-erythematous. Neck: No stridor. No cervical spine tenderness to palpation. Cardiovascular: Normal rate, regular rhythm. Grossly normal heart sounds.  Good peripheral circulation. Respiratory: Normal respiratory effort.  No retractions. Lungs CTAB. Gastrointestinal: Soft , nondistended, nontender to palpation. No CVA tenderness. Musculoskeletal: No lower extremity tenderness nor edema.  No joint effusions. No signs of acute trauma. Neurologic: Right-sided weakness is present to both arms and legs.  Some asymmetry with facial grimacing.  Aphasic and with incomprehensible speech.  Does follow simple commands in all 4 extremities. Skin:  Skin is warm, dry and intact. No rash noted. Psychiatric: Mood and affect are normal. Speech and behavior are normal. ____________________________________________   LABS (all labs ordered are listed, but only abnormal results are displayed)  Labs Reviewed  CBC - Abnormal; Notable for the following components:      Result Value   Hemoglobin 11.5 (*)    HCT 34.9 (*)    All other components within normal limits  DIFFERENTIAL - Abnormal; Notable for the following components:   Neutro Abs 9.5 (*)    Lymphs Abs 0.5 (*)  All other components within normal limits  COMPREHENSIVE METABOLIC PANEL - Abnormal; Notable for the following components:   Sodium 132 (*)    Potassium 3.4 (*)    Glucose, Bld 143 (*)    BUN 24 (*)    GFR, Estimated 58 (*)    All other components within normal limits  URINALYSIS, ROUTINE W REFLEX MICROSCOPIC -  Abnormal; Notable for the following components:   Color, Urine STRAW (*)    Hgb urine dipstick MODERATE (*)    Ketones, ur 40 (*)    Protein, ur 30 (*)    Leukocytes,Ua SMALL (*)    All other components within normal limits  URINALYSIS, MICROSCOPIC (REFLEX) - Abnormal; Notable for the following components:   Bacteria, UA RARE (*)    All other components within normal limits  CBG MONITORING, ED - Abnormal; Notable for the following components:   Glucose-Capillary 133 (*)    All other components within normal limits  RESP PANEL BY RT-PCR (FLU A&B, COVID) ARPGX2  ETHANOL  PROTIME-INR  APTT  URINE DRUG SCREEN, QUALITATIVE ([facility] ONLY)   ____________________________________________  12 Lead EKG  A. fib with RVR, rate of 121 bpm.  Normal axis.  QTC borderline prolonged at 497.  No overtly ischemic features. ____________________________________________  RADIOLOGY  ED MD interpretation: CT head reviewed by me with evidence of left acute MCA infarct  Official radiology report(s): DG Chest Portable 1 View  Result Date: 06/28/2021 CLINICAL DATA:  New onset atrial fibrillation.  Acute stroke. EXAM: PORTABLE CHEST 1 VIEW COMPARISON:  02/29/2020 FINDINGS: Heart size upper limits of normal. Calcification and tortuosity of the aorta. Mildly prominent interstitial markings in the mid and lower lungs, improved since the study of February 29, 2020 where there was quite likely acute pneumonia. Today's markings could be chronic or indicate mild active inflammatory disease. No consolidation or collapse. No effusion. Bony structures unremarkable. IMPRESSION: No consolidation, collapse, frank edema or effusion. Slightly prominent markings at the lung bases which may be residua of previous pneumonia. Electronically Signed   By: Paulina Fusi M.D.   On: 06/28/2021 16:28   CT HEAD CODE STROKE WO CONTRAST  Result Date: 06/28/2021 CLINICAL DATA:  Code stroke. Provided history: Neuro deficit, acute, stroke  suspected; right-sided weakness. Additional history provided: Aphasia, right-sided weakness. EXAM: CT HEAD WITHOUT CONTRAST TECHNIQUE: Contiguous axial images were obtained from the base of the skull through the vertex without intravenous contrast. COMPARISON:  No pertinent prior exams available for comparison. FINDINGS: Brain: Mild generalized cerebral and cerebellar atrophy. Abnormal cortical/subcortical hypodensity with loss of gray-white differentiation within portions of the left frontal operculum and left insula, compatible with acute left MCA territory infarct. No significant mass effect at this time. No evidence of hemorrhagic conversion. Background advanced patchy and confluent hypoattenuation within the cerebral white matter, nonspecific but compatible with chronic small vessel ischemic disease. Small chronic infarcts within the basal ganglia and likely within the left cerebellar hemisphere. No extra-axial fluid collection. No evidence of an intracranial mass. No midline shift. Vascular: Asymmetric hyperdensity of a proximal M2 left MCA vessel suspicious for endoluminal thrombus at this site (for instance as seen on series 3, image 13). Atherosclerotic calcifications. Skull: Normal. Negative for fracture or focal lesion. Sinuses/Orbits: Leftward gaze. Trace mucosal thickening within the bilateral ethmoid air cells. ASPECTS [facility] Stroke Program Early CT Score) - Ganglionic level infarction (caudate, lentiform nuclei, internal capsule, insula, M1-M3 cortex): 5 - Supraganglionic infarction (M4-M6 cortex): 2 Total score (0-10 with 10 being normal): 7  These results were called by telephone at the time of interpretation on 06/28/2021 at 3:38 pm to provider Dr. Selina Cooley, who verbally acknowledged these results. IMPRESSION: Acute left MCA territory cortical/subcortical infarct within the left frontal operculum and left insula. ASPECTS is 7. No evidence of hemorrhagic conversion. No significant mass effect at this  time. Background advanced chronic small vessel ischemic changes within the cerebral white matter. Chronic infarcts within the bilateral basal ganglia and likely left cerebellar hemisphere. Mild generalized parenchymal atrophy. Electronically Signed   By: Jackey Loge D.O.   On: 06/28/2021 15:41   CT ANGIO HEAD NECK W WO CM W PERF (CODE STROKE)  Result Date: 06/28/2021 CLINICAL DATA:  Neuro deficit, acute, stroke suspected. Additional history provided: Aphasia, right-sided weakness. EXAM: CT ANGIOGRAPHY HEAD AND NECK CT PERFUSION BRAIN TECHNIQUE: Multidetector CT imaging of the head and neck was performed using the standard protocol during bolus administration of intravenous contrast. Multiplanar CT image reconstructions and MIPs were obtained to evaluate the vascular anatomy. Carotid stenosis measurements (when applicable) are obtained utilizing NASCET criteria, using the distal internal carotid diameter as the denominator. Multiphase CT imaging of the brain was performed following IV bolus contrast injection. Subsequent parametric perfusion maps were calculated using RAPID software. CONTRAST:  OMNIPAQUE IOHEXOL 350 MG/ML SOLN COMPARISON:  Noncontrast head CT performed earlier today 06/28/2021. FINDINGS: CTA NECK FINDINGS Aortic arch: Common origin of the innominate and left common carotid arteries. Atherosclerotic plaque within the visualized aortic arch and proximal major branch vessels of the neck. No hemodynamically significant innominate or proximal subclavian artery stenosis. Right carotid system: CCA and ICA patent within the neck without stenosis or significant atherosclerotic disease. Tortuosity of the cervical ICA. Left carotid system: CCA and ICA patent within the neck without hemodynamically significant stenosis (50% or greater). Minimal atherosclerotic plaque at the CCA origin and within the proximal ICA. Tortuosity of the cervical ICA. Vertebral arteries: Vertebral arteries codominant and  patent within the neck. Mild nonstenotic atherosclerotic plaque at the origin of the right vertebral artery. Skeleton: Trace C2-C3 grade 1 retrolisthesis. Trace C7-T1 grade 1 anterolisthesis. Cervical spondylosis. C3-C4 and C4-C5 ankylosis. No acute bony abnormality or aggressive osseous lesion. Other neck: Subcentimeter nodules within the right thyroid lobe, not meeting consensus criteria for ultrasound follow-up based on size. No cervical lymphadenopathy. Upper chest: No consolidation within the imaged lung apices. Borderline enlarged right tracheobronchial lymph node measuring 11 mm in short axis (series 9, image 2). Review of the MIP images confirms the above findings CTA HEAD FINDINGS Anterior circulation: The intracranial internal carotid arteries are patent. Nonstenotic atherosclerotic plaque within both vessels. 6 x 6 mm inferomedially projecting saccular aneurysm arising from the distal cavernous/paraclinoid left ICA (for instance as seen on series 9, image 248) (series 12, image 116). Separate 2 mm inferiorly projecting vascular protrusion arising from the paraclinoid left ICA more laterally, which may reflect an infundibulum or additional aneurysm (for instance as seen on series 9, image 251) (series 12, image 120). The M1 middle cerebral arteries are patent. No right M2 proximal branch occlusion or high-grade proximal stenosis is identified. There is occlusion of a distal M2/proximal M3 left MCA vessel (series 14, image 31) (series 10, image 56). The anterior cerebral arteries are patent. Posterior circulation: The intracranial vertebral arteries are patent. The basilar artery is patent. The posterior cerebral arteries are patent. A right posterior communicating artery is present. The left posterior communicating artery is diminutive or absent. Venous sinuses: Within the limitations of contrast timing, no convincing  thrombus. Anatomic variants: As described. Review of the MIP images confirms the above  findings CT Brain Perfusion Findings: CBF (<30%) Volume: 0mL Perfusion (Tmax>6.0s) volume: 10mL (within the posterior left MCA vascular territory). Mismatch Volume: 10mL Infarction Location:None identified Emergent findings discussed with Dr. Selina Cooley at 4 p.m. on 06/28/2021 by telephone. IMPRESSION: CTA neck: 1. The common carotid, internal carotid and vertebral arteries are patent within the neck without significant stenosis. Mild atherosclerotic disease within the major arterial vessels of the neck, as described. 2.  Aortic Atherosclerosis (ICD10-I70.0). 3. Partially imaged borderline enlarged right tracheobronchial mediastinal lymph node, nonspecific. Non-emergent dedicated CT imaging of the chest may be obtained for further evaluation, as clinically warranted. CTA head: 1. Occlusion of a distal M2/proximal M3 left MCA vessel. 2. Non-stenotic atherosclerotic plaque within the intracranial internal carotid arteries, bilaterally. 3. 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 4. Adjacent 2 mm aneurysm versus infundibulum arising from the paraclinoid left ICA. CT perfusion head: The perfusion software identifies a 10 mL region of critically hypoperfused parenchyma within the posterior left MCA vascular territory ( Tmax>6 seconds threshold). The perfusion software identifies no core infarct. However, there are clear changes of acute infarction on the concurrently performed noncontrast head CT. The perfusion software reports a mismatch volume of 10 mL. Electronically Signed   By: Jackey Loge D.O.   On: 06/28/2021 16:14    ____________________________________________   PROCEDURES and INTERVENTIONS  Procedure(s) performed (including Critical Care):  .1-3 Lead EKG Interpretation Performed by: Delton Prairie, MD Authorized by: Delton Prairie, MD     Interpretation: abnormal     ECG rate:  121   ECG rate assessment: tachycardic     Rhythm: atrial  fibrillation     Ectopy: none     Conduction: normal   .Critical Care Performed by: Delton Prairie, MD Authorized by: Delton Prairie, MD   Critical care provider statement:    Critical care time (minutes):  30   Critical care time was exclusive of:  Separately billable procedures and treating other patients   Critical care was necessary to treat or prevent imminent or life-threatening deterioration of the following conditions:  CNS failure or compromise and cardiac failure   Critical care was time spent personally by me on the following activities:  Development of treatment plan with patient or surrogate, discussions with consultants, evaluation of patient's response to treatment, examination of patient, ordering and review of laboratory studies, ordering and review of radiographic studies, ordering and performing treatments and interventions, pulse oximetry, re-evaluation of patient's condition and review of old charts  Medications  diltiazem (CARDIZEM) injection 5 mg (has no administration in time range)  ondansetron (ZOFRAN) 4 MG/2ML injection (4 mg  Given 06/28/21 1613)  iohexol (OMNIPAQUE) 350 MG/ML injection 100 mL (100 mLs Intravenous Contrast Given 06/28/21 1548)  diltiazem (CARDIZEM) injection 5 mg (5 mg Intravenous Given 06/28/21 1614)  lactated ringers bolus 1,000 mL (1,000 mLs Intravenous Bolus 06/28/21 1638)    ____________________________________________   MDM / ED COURSE   87 year old female who lives independently presents to the ED 16 hours after LKN with evidence of an acute stroke.  Right-sided weakness and aphasia are present.  CT, CTA and CTP demonstrate evidence of distal left M2 occlusion and associated acute stroke without ICH.  She is outside the window for any thrombolytics, but would benefit from IR angiogram and possible thrombectomy.  When discussing with our neurology team, unfortunately [facility] cannot currently accommodate the patient  and so we are reaching out  to [facility] for transfer to facilitate this. Otherwise, she has evidence of new onset atrial fibrillation with RVR.  Rapid rates in the 120s-130s and she is quite hypertensive with systolics 180s-200s.  Using small aliquots, 5 mg at a time, of diltiazem IV for some improved rate control while maintaining permissive hypertension with goal systolics of at least 130-160. No evidence of gross volume overload or respiratory failure. Does have some evidence of dehydration with ketonuria and elevated BUN that we will resuscitate with gentle LR.  Clinical Course as of 06/28/21 1722  Sat Jun 28, 2021  1524 I briefly discussed with neurology, Dr. Selina Cooley, who goes to evaluate the patient in CT. [DS]  1557 Reassessed after CT and CTA when she is back in the room.  A. fib with RVR rates in the 1 teens and 120s.  This appears to be new.  Remains hypertensive with SBP 190s.  We will start with a 10 mg diltiazem bolus and see how she responds to this. [DS]  1612 Discussed with Dr. Selina Cooley. Possible transfter to [facility]. Would likely benefit from IR angiogram, and  can't currently accommodate.  Sounds like they already have someone on the table. [DS]  1615 Grandson chatting with Dr. Selina Cooley [DS]  1620 Family is agreeable to transfer for intervention [DS]  1624 Dr. Selina Cooley talking to [facility] transfer center to discuss with IR [DS]  1639 Dr. Selina Cooley still on hold with IR at [facility] [DS]  1700 Selina Cooley still on the phone [DS]  1711 Apparently [facility] declined. We'll admit here medically. Dr. Selina Cooley talking to family [DS]    Clinical Course User Index [DS] Delton Prairie, MD    ____________________________________________   FINAL CLINICAL IMPRESSION(S) / ED DIAGNOSES  Final diagnoses:  Cerebrovascular accident (CVA) due to other mechanism [facility])  Atrial fibrillation with RVR (HCC)  New onset atrial fibrillation [facility])     ED Discharge Orders     None        Jenner Rosier   Note:  This document was prepared using  Dragon voice recognition software and may include unintentional dictation errors.    Delton Prairie, MD 06/28/21 [id number]    Delton Prairie, MD 06/28/21 [phone]

## 2021-06-28 NOTE — Plan of Care (Addendum)
Brief Neuro Update:  Notified by [facility] about acute L MCA stroke along with SWI images demonstrating hemorrhage. On review of the MRI Brain, shows more than petechial hemorrhage and probably some hemorrhagic transformation.  Recs:  - Keep SBP < 160 - Repeat CTH in 6 hours - Hold Aspirin and DVT prophylaxis. - Transfer to [facility] Neuro ICU under Neurohospitalist service for close monitoring overnight.  Spoke with Steward Drone with the hospitalist team at [facility]. Also spoke with [facility] team about transfer.  Erick Blinks [facility] Pager Number [phone]

## 2021-06-28 NOTE — ED Notes (Signed)
Pt accepted to [facility] per [facility]. Call [phone] for report. Careline to transport eta 10:30

## 2021-06-28 NOTE — Consult Note (Addendum)
NEUROLOGY CONSULTATION NOTE   Date of service: June 28, 2021 Patient Name: Shawna Hopkins MRN:  [id number] DOB:  Feb 27, 1928 Reason for consult: stroke code Requesting physician: Dr. Vladimir Crofts _ _ _   _ __   _ __ _ _  __ __   _ __   __ _  History of Present Illness   87 yo woman with hx HTN BIB EMS with acute onset global aphasia, R sided weakness, L gaze deviation. LKW 2300 last night when she was last seen by family. She was found this afternoon in her home, unable to speak or follow commands, weak in R face/arm/leg. Stroke code called on arrival to ED. Patient presented outside the window for TNK. NIHSS = 14. CT head showed acute L MCA infarct with changes in L frontal operculum and L insula (ASPECTS 7). CTA showed occlusion of L distal M2 / prox M3 occlusion. CT perfusion shows 0 ml core with 37ml penumbra (core measure felt to be underestimated given changes on head CT c/w acute infarct, although the changes on head CT do not map to the exact area shown as having a perfusion deficit on CTP.)  I spoke with her 2 children Shawna Hopkins (daughter) and Shawna Hopkins (son) by phone. Shawna Hopkins deferred to her brother Shawna Hopkins [phone]). I discussed the possibility of patient being a thrombectomy candidate, and explained the risks/benefits of the procedure (risks including hemorrhage, which could be fatal). Son wishes to proceed with thrombectomy if she is a candidate. [facility] IR team already activated and en route to hospital for thrombectomy for a different patient. Given that she presented to [facility], I called [facility] transfer center and spoke with Dr. Sammuel Hines w/ neurointervention who reviewed the images and felt that patient was not a candidate 2/2 vessel being too distal (he stated that vessel was distal M3/M4).  Patient has no documented prior hx of a fib and is not on anticoagulation at home. She was in a fib w/ RVR on presentation today.  At baseline she has no neurologic deficits per  family and lives independently at age 7.  All CNS imaging personally reviewed and discussed by phone with neuroradiology Dr. Armandina Gemma.    ROS   UTA 2/2 aphasia  Past History   I have reviewed the following:  Past Medical History:  Diagnosis Date   Hypertension    Past Surgical History:  Procedure Laterality Date   ABDOMINAL SURGERY     Galbladder   No family history on file. Social History   Socioeconomic History   Marital status: Widowed    Spouse name: Not on file   Number of children: Not on file   Years of education: Not on file   Highest education level: Not on file  Occupational History   Not on file  Tobacco Use   Smoking status: Never   Smokeless tobacco: Never  Substance and Sexual Activity   Alcohol use: Not Currently   Drug use: Never   Sexual activity: Not on file  Other Topics Concern   Not on file  Social History Narrative   Not on file   Social Determinants of Health   Financial Resource Strain: Not on file  Food Insecurity: Not on file  Transportation Needs: Not on file  Physical Activity: Not on file  Stress: Not on file  Social Connections: Not on file   No Known Allergies  Medications   (Not in a hospital admission)     Current Facility-Administered Medications:  diltiazem (CARDIZEM) injection 5 mg, 5 mg, Intravenous, Q10 min PRN, Vladimir Crofts, MD  Current Outpatient Medications:    albuterol (VENTOLIN HFA) 108 (90 Base) MCG/ACT inhaler, Inhale 2 puffs into the lungs every 6 (six) hours as needed for wheezing or shortness of breath., Disp: 8 g, Rfl: 2  Vitals   Vitals:   06/28/21 1608 06/28/21 1610 06/28/21 1615 06/28/21 1620  BP: (!) 165/106 (!) 181/107 (!) 134/110 (!) 148/94  Pulse: 89 (!) 110 (!) 101 (!) 102  Resp: (!) 30 (!) 26 15 (!) 25  SpO2: 96% 98% 95%   Weight:         Body mass index is 23.66 kg/m.  Physical Exam   Physical Exam Gen: alert, unable to answer orientation questions 2/2 aphasia, unable to  follow commands CV: irregularly irregular Resp: CTAB  Neuro: *MS: alert, unable to answer orientation questions 2/2 aphasia, unable to follow commands *Speech: no intelligible speech *CN: PERRL, blinks to threat bilat, optic discs unable to be visualized 2/2 pupillary constriction, L gaze deviation on arrival, resolved by end of stroke code, no ptosis, sensation intact, R UMN facial droop, hearing intact to voice. *Motor:   Normal bulk.  No tremor, rigidity or bradykinesia. LUE and LLE full strength throughout. RUE and RLE anti-gravity with drift to bed. *Sensory: Intact to light touch, pinprick, temperature vibration throughout. Symmetric. Propioception intact bilat.  No double-simultaneous extinction.  *Coordination:  UTA  *Reflexes:  2+ and symmetric throughout without clonus; toes down-going bilat *Gait: UTA  NIHSS  1a Level of Conscious.: 0 1b LOC Questions: 2 1c LOC Commands: 2 2 Best Gaze: 2 3 Visual: 0 4 Facial Palsy: 1 5a Motor Arm - left: 0 5b Motor Arm - Right: 2 6a Motor Leg - Left: 0 6b Motor Leg - Right: 2 7 Limb Ataxia: 0 8 Sensory: 0 9 Best Language: 3 10 Dysarthria: 0 11 Extinct. and Inatten.: 0  TOTAL: 14   Premorbid mRS = 1   Labs   CBC:  Recent Labs  Lab 06/28/21 1552  WBC 10.5  NEUTROABS 9.5*  HGB 11.5*  HCT 34.9*  MCV 87.9  PLT [id number]    Basic Metabolic Panel:  Lab Results  Component Value Date   NA 137 02/29/2020   K 3.1 (L) 02/29/2020   CO2 27 02/29/2020   GLUCOSE 130 (H) 02/29/2020   BUN 15 02/29/2020   CREATININE 0.83 02/29/2020   CALCIUM 8.9 02/29/2020   GFRNONAA >60 02/29/2020   GFRAA >60 02/29/2020   Lipid Panel: No results found for: LDLCALC HgbA1c: No results found for: HGBA1C Urine Drug Screen: No results found for: LABOPIA, COCAINSCRNUR, LABBENZ, AMPHETMU, THCU, LABBARB  Alcohol Level     Component Value Date/Time   ETH <10 06/28/2021 1552     Impression   87 yo woman with hx HTN BIB EMS with acute onset global  aphasia, R sided weakness, L gaze deviation. She presented outside the window for TNK and was declined for thrombectomy by [facility] neuro-IR because they felt the clot was too distal. I had a very long discussion with son and grandson at bedside about this and they expressed understanding.  Recommendations   - Admit to hospitalist service for stroke w/u - Permissive HTN x48 hrs from sx onset goal BP <220/110. PRN labetalol or hydralazine if BP above these parameters. Avoid oral antihypertensives. After 48 hrs will gently lower BP goals. - Patient presented in a fib w/ RVR. When treating this please use every precaution  to avoid even relative hypotension (please try to maintain SBP at least >140) given her large vessel occlusion - MRI brain wo contrast - TTE  - Check A1c and LDL + add statin per guidelines - ASA 81mg  daily + plavix 75mg  daily for now. Patient will need to be evaluated for anticoagulation for secondary stroke prevention in the setting of a fib, but will hold off on this in the acute post-stroke period. MRI brain appearance will further inform timing for anticoagulation restart (likely later this hospitalization). When anticoagulation is restarted, antiplatelets should be d/c'd unless there is a cardiac indication to continue aspirin in addition. - q4 hr neuro checks - STAT head CT for any change in neuro exam - Tele - PT/OT/SLP - Stroke education - Amb referral to neurology upon discharge   Will continue to follow.   This patient was critically ill and at significant risk of neurological worsening, death and care requires constant monitoring of vital signs, hemodynamics,respiratory and cardiac monitoring, neurological assessment, discussion with family, other specialists and medical decision making of high complexity. I spent 110 minutes of neurocritical care time  in the care of  this patient. This was time spent independent of any time provided by nurse practitioner or  PA.  , MD [facility] [phone]  If 7pm- 7am, please page neurology on call as listed in AMION.

## 2021-06-28 NOTE — ED Notes (Signed)
Patient transported to CT 

## 2021-06-29 ENCOUNTER — Inpatient Hospital Stay (HOSPITAL_COMMUNITY): Payer: Medicare Other

## 2021-06-29 ENCOUNTER — Inpatient Hospital Stay (HOSPITAL_COMMUNITY)
Admission: AD | Admit: 2021-06-29 | Discharge: 2021-07-01 | DRG: 064 | Disposition: A | Discharge status: Rehabilitation Facility | Payer: Medicare Other | Source: Other Acute Inpatient Hospital | Attending: Neurology | Admitting: Neurology

## 2021-06-29 DIAGNOSIS — E785 Hyperlipidemia, unspecified: Secondary | ICD-10-CM | POA: Diagnosis present

## 2021-06-29 DIAGNOSIS — F05 Delirium due to known physiological condition: Secondary | ICD-10-CM | POA: Diagnosis present

## 2021-06-29 DIAGNOSIS — K219 Gastro-esophageal reflux disease without esophagitis: Secondary | ICD-10-CM | POA: Diagnosis present

## 2021-06-29 DIAGNOSIS — F802 Mixed receptive-expressive language disorder: Secondary | ICD-10-CM | POA: Diagnosis present

## 2021-06-29 DIAGNOSIS — I4891 Unspecified atrial fibrillation: Secondary | ICD-10-CM | POA: Diagnosis present

## 2021-06-29 DIAGNOSIS — I63512 Cerebral infarction due to unspecified occlusion or stenosis of left middle cerebral artery: Secondary | ICD-10-CM | POA: Diagnosis not present

## 2021-06-29 DIAGNOSIS — R4701 Aphasia: Secondary | ICD-10-CM | POA: Diagnosis present

## 2021-06-29 DIAGNOSIS — I69320 Aphasia following cerebral infarction: Secondary | ICD-10-CM | POA: Diagnosis not present

## 2021-06-29 DIAGNOSIS — R29713 NIHSS score 13: Secondary | ICD-10-CM | POA: Diagnosis present

## 2021-06-29 DIAGNOSIS — R531 Weakness: Secondary | ICD-10-CM | POA: Diagnosis present

## 2021-06-29 DIAGNOSIS — G4700 Insomnia, unspecified: Secondary | ICD-10-CM | POA: Diagnosis present

## 2021-06-29 DIAGNOSIS — R7303 Prediabetes: Secondary | ICD-10-CM | POA: Diagnosis present

## 2021-06-29 DIAGNOSIS — I619 Nontraumatic intracerebral hemorrhage, unspecified: Secondary | ICD-10-CM | POA: Diagnosis present

## 2021-06-29 DIAGNOSIS — R509 Fever, unspecified: Secondary | ICD-10-CM | POA: Diagnosis not present

## 2021-06-29 DIAGNOSIS — I480 Paroxysmal atrial fibrillation: Secondary | ICD-10-CM | POA: Diagnosis not present

## 2021-06-29 DIAGNOSIS — I63412 Cerebral infarction due to embolism of left middle cerebral artery: Secondary | ICD-10-CM | POA: Diagnosis present

## 2021-06-29 DIAGNOSIS — I671 Cerebral aneurysm, nonruptured: Secondary | ICD-10-CM

## 2021-06-29 DIAGNOSIS — G8191 Hemiplegia, unspecified affecting right dominant side: Secondary | ICD-10-CM | POA: Diagnosis present

## 2021-06-29 DIAGNOSIS — I483 Typical atrial flutter: Secondary | ICD-10-CM | POA: Diagnosis not present

## 2021-06-29 DIAGNOSIS — I4892 Unspecified atrial flutter: Secondary | ICD-10-CM | POA: Diagnosis present

## 2021-06-29 DIAGNOSIS — I10 Essential (primary) hypertension: Secondary | ICD-10-CM | POA: Diagnosis present

## 2021-06-29 DIAGNOSIS — Z7982 Long term (current) use of aspirin: Secondary | ICD-10-CM | POA: Diagnosis not present

## 2021-06-29 DIAGNOSIS — I69392 Facial weakness following cerebral infarction: Secondary | ICD-10-CM | POA: Diagnosis not present

## 2021-06-29 DIAGNOSIS — N390 Urinary tract infection, site not specified: Secondary | ICD-10-CM | POA: Diagnosis present

## 2021-06-29 DIAGNOSIS — I69354 Hemiplegia and hemiparesis following cerebral infarction affecting left non-dominant side: Secondary | ICD-10-CM | POA: Diagnosis not present

## 2021-06-29 LAB — HEMOGLOBIN A1C
Hgb A1c MFr Bld: 6 % — ABNORMAL HIGH (ref 4.8–5.6)
Mean Plasma Glucose: 125.5 mg/dL

## 2021-06-29 LAB — ECHOCARDIOGRAM COMPLETE
AR max vel: 1.97 cm2
AV Area VTI: 1.77 cm2
AV Area mean vel: 1.61 cm2
AV Mean grad: 3 mmHg
AV Peak grad: 5 mmHg
Ao pk vel: 1.12 m/s
Area-P 1/2: 3.08 cm2
MV VTI: 1.05 cm2
S' Lateral: 2.3 cm
Weight: 2289.26 oz

## 2021-06-29 LAB — CBC
HCT: 36.9 % (ref 36.0–46.0)
Hemoglobin: 11.9 g/dL — ABNORMAL LOW (ref 12.0–15.0)
MCH: 28.7 pg (ref 26.0–34.0)
MCHC: 32.2 g/dL (ref 30.0–36.0)
MCV: 88.9 fL (ref 80.0–100.0)
Platelets: 214 10*3/uL (ref 150–400)
RBC: 4.15 MIL/uL (ref 3.87–5.11)
RDW: 12.4 % (ref 11.5–15.5)
WBC: 8.2 10*3/uL (ref 4.0–10.5)
nRBC: 0 % (ref 0.0–0.2)

## 2021-06-29 LAB — LIPID PANEL
Cholesterol: 134 mg/dL (ref 0–200)
HDL: 55 mg/dL (ref >40)
LDL Cholesterol: 69 mg/dL (ref 0–99)
Total CHOL/HDL Ratio: 2.4 RATIO
Triglycerides: 52 mg/dL (ref <150)
VLDL: 10 mg/dL (ref 0–40)

## 2021-06-29 LAB — BASIC METABOLIC PANEL
Anion gap: 9 (ref 5–15)
BUN: 21 mg/dL (ref 8–23)
CO2: 25 mmol/L (ref 22–32)
Calcium: 9 mg/dL (ref 8.9–10.3)
Chloride: 103 mmol/L (ref 98–111)
Creatinine, Ser: 1.08 mg/dL — ABNORMAL HIGH (ref 0.44–1.00)
GFR, Estimated: 48 mL/min — ABNORMAL LOW (ref >60)
Glucose, Bld: 130 mg/dL — ABNORMAL HIGH (ref 70–99)
Potassium: 3.4 mmol/L — ABNORMAL LOW (ref 3.5–5.1)
Sodium: 137 mmol/L (ref 135–145)

## 2021-06-29 LAB — GLUCOSE, CAPILLARY
Glucose-Capillary: 114 mg/dL — ABNORMAL HIGH (ref 70–99)
Glucose-Capillary: 91 mg/dL (ref 70–99)
Glucose-Capillary: 103 mg/dL — ABNORMAL HIGH (ref 70–99)
Glucose-Capillary: 100 mg/dL — ABNORMAL HIGH (ref 70–99)

## 2021-06-29 LAB — MRSA NEXT GEN BY PCR, NASAL: MRSA by PCR Next Gen: NOT DETECTED

## 2021-06-29 MED ORDER — ONDANSETRON HCL 4 MG/2ML IJ SOLN
INTRAMUSCULAR | Status: AC
  Filled 2021-06-29: qty 2

## 2021-06-29 MED ORDER — CHLORHEXIDINE GLUCONATE CLOTH 2 % EX PADS
6.0000 | MEDICATED_PAD | Freq: Every day | CUTANEOUS | Status: DC
  Administered 2021-06-29 – 2021-06-30 (×2): 6 via TOPICAL

## 2021-06-29 MED ORDER — ACETAMINOPHEN 160 MG/5ML PO SOLN: 650.0000 mg | ORAL | Status: DC | PRN

## 2021-06-29 MED ORDER — WHITE PETROLATUM EX OINT
TOPICAL_OINTMENT | CUTANEOUS | Status: AC
  Filled 2021-06-29: qty 28.35

## 2021-06-29 MED ORDER — ACETAMINOPHEN 650 MG RE SUPP
650.0000 mg | RECTAL | Status: DC | PRN
  Administered 2021-06-29: 09:00:00 650 mg via RECTAL
  Filled 2021-06-29: qty 1

## 2021-06-29 MED ORDER — SODIUM CHLORIDE 0.9 % IV SOLN: INTRAVENOUS | Status: DC

## 2021-06-29 MED ORDER — SENNOSIDES-DOCUSATE SODIUM 8.6-50 MG PO TABS
1.0000 | ORAL_TABLET | Freq: Two times a day (BID) | ORAL | Status: DC
  Administered 2021-06-30 – 2021-07-01 (×2): 1 via ORAL
  Filled 2021-06-29 (×3): qty 1

## 2021-06-29 MED ORDER — STROKE: EARLY STAGES OF RECOVERY BOOK: Freq: Once | Status: DC

## 2021-06-29 MED ORDER — PANTOPRAZOLE SODIUM 40 MG IV SOLR
40.0000 mg | Freq: Every day | INTRAVENOUS | Status: DC
  Administered 2021-06-29 (×2): 40 mg via INTRAVENOUS
  Filled 2021-06-29 (×2): qty 40

## 2021-06-29 MED ORDER — METOPROLOL TARTRATE 5 MG/5ML IV SOLN
2.5000 mg | Freq: Four times a day (QID) | INTRAVENOUS | Status: DC | PRN
Start: 2021-06-29 — End: 2021-07-01

## 2021-06-29 MED ORDER — ACETAMINOPHEN 325 MG PO TABS
650.0000 mg | ORAL_TABLET | ORAL | Status: DC | PRN
  Administered 2021-07-01: 14:00:00 650 mg via ORAL
  Filled 2021-06-29: qty 2

## 2021-06-29 NOTE — Evaluation (Signed)
Clinical/Bedside Swallow Evaluation Patient Details  Name: Shawna Hopkins MRN: [id number] Date of Birth: 09-10-1927  Today's Date: 06/29/2021 Time: SLP Start Time (ACUTE ONLY): 1335 SLP Stop Time (ACUTE ONLY): 1428 SLP Time Calculation (min) (ACUTE ONLY): 53 min  Past Medical History:  Past Medical History:  Diagnosis Date   Hypertension    Past Surgical History:  Past Surgical History:  Procedure Laterality Date   ABDOMINAL SURGERY     Galbladder   HPI:  87 yo female who lives alone and has h/o HTN, GERD - was transferred from [facility] with global aphasia, R sided weakness,  Found to have left mca cva.  Pt also with h/o Cervical spine C3-C4 and C4-C5 ankylosis per prior imaging. Brain imaging also has shown "Mild chronic small vessel ischemic changes are also present within the pons. Chronic lacunar infarcts within the bilateral basal ganglia., Small chronic infarcts within the bilateral cerebellar hemispheres."      PT remains globally aphasic, family denies pt having issues swallowing from their recollection.    Assessment / Plan / Recommendation  Clinical Impression  Pt with global aphasia - difficulty following directions or voicing.  She does demonstrate right facial asymmetry - can not view palate for elevation evaluation due to aphonia = able to seal lips on straw but decreased right seal at times.  SLP provided pt with oral care using suctioning- viscous white tinged secretions removed after mouth care and minimal intake - Upper denture in place and lower teeth present.       Pt able to self feed water, nectar juice, applesauce and solid - soft cracker. Significant delay in swallow noted *presumed oral more than pharyngeal given Left MCA CVA.  Subtle throat clear x1 after water consumed and cough x1 with sequential swallows of nectar via straw that may indicate laryngeal infiltration.  Suspect pt with primary oral dysphagia but her inability to follow directions, increases her risk if  she does aspirate. She also has h/o cervical spondylosis per prior imaging at C3-C4 as well as bilateral pna when she had COVID per her grandson.       No s/s of aspiration with tsps of thin water, small boluses of nectar, puree, and cracker.  No oral retention present after observation.   Recommend dys1/nectar - and allow tsps of thin. Using teach back educated pt's family and posted precaution signs.   SLP will follow up next date for swallow management and language evaluation.      Educated family to basic information re: language deficits with her event. SLP Visit Diagnosis: Dysphagia, unspecified (R13.10)    Aspiration Risk  Mild aspiration risk    Diet Recommendation Dysphagia 1 (Puree);Nectar-thick liquid (tsps thin water ok)   Medication Administration: Whole meds with puree Supervision: Patient able to self feed Compensations: Slow rate;Small sips/bites;Lingual sweep for clearance of pocketing Postural Changes: Seated upright at 90 degrees;Remain upright for at least 30 minutes after po intake    Other  Recommendations Oral Care Recommendations: Oral care BID Other Recommendations: Order thickener from pharmacy    Recommendations for follow up therapy are one component of a multi-disciplinary discharge planning process, led by the attending physician.  Recommendations may be updated based on patient status, additional functional criteria and insurance authorization.  Follow up Recommendations Acute inpatient rehab (3hours/day)      Assistance Recommended at Discharge Frequent or constant Supervision/Assistance  Functional Status Assessment Patient has had a recent decline in their functional status and demonstrates the ability to  make significant improvements in function in a reasonable and predictable amount of time.  Frequency and Duration min 2x/week  2 weeks       Prognosis Prognosis for Safe Diet Advancement: Fair Barriers to Reach Goals: Language deficits      Swallow  Study   General Date of Onset: 06/28/21 HPI: 87 yo female who lives alone and has h/o HTN, GERD - was transferred from [facility] with global aphasia, R sided weakness,  Found to have left mca cva.  Pt also with h/o Cervical spine C3-C4 and C4-C5 ankylosis per prior imaging. Brain imaging also has shown "Mild chronic small vessel ischemic changes are also present within the pons. Chronic lacunar infarcts within the bilateral basal ganglia., Small chronic infarcts within the bilateral cerebellar hemispheres."      PT remains globally aphasic, family denies pt having issues swallowing from their recollection. Type of Study: Bedside Swallow Evaluation Diet Prior to this Study: NPO Temperature Spikes Noted: No Respiratory Status: Room air History of Recent Intubation: No Behavior/Cognition: Alert;Doesn't follow directions;Other (Comment) (mimics some) Oral Cavity Assessment: Dry Oral Care Completed by SLP: Yes Oral Cavity - Dentition: Dentures, top;Adequate natural dentition Vision:  (has glasses, family to bring, but pt able to self feed) Self-Feeding Abilities: Able to feed self Patient Positioning: Upright in bed Baseline Vocal Quality: Low vocal intensity (on occasion heard=weak) Volitional Cough: Cognitively unable to elicit Volitional Swallow: Unable to elicit    Oral/Motor/Sensory Function Overall Oral Motor/Sensory Function: Mild impairment (limited eval due to pt's aphasia) Facial ROM: Reduced right (decr labial seal on straw) Facial Symmetry: Abnormal symmetry right Facial Strength: Reduced right Facial Sensation:  (dnt) Lingual ROM:  (dnt) Lingual Symmetry:  (dnt) Lingual Strength: Other (Comment) (dnt) Lingual Sensation:  (dnt) Velum: Other (comment) (not observed, pt did not phonate) Mandible:  (dnt)   Ice Chips Ice chips: Not tested (? dental sensitivity)   Thin Liquid Thin Liquid: Impaired Presentation: Spoon;Straw;Cup Oral Phase Functional Implications: Prolonged oral  transit Pharyngeal  Phase Impairments: Suspected delayed Swallow;Throat Clearing - Delayed    Nectar Thick Nectar Thick Liquid: Impaired Presentation: Cup;Self Fed;Spoon;Straw Oral phase functional implications: Prolonged oral transit Pharyngeal Phase Impairments: Cough - Delayed Other Comments: delayed cough x1 after large sequential boluses of nectar   Honey Thick Honey Thick Liquid: Not tested   Puree Puree: Impaired Presentation: Self Fed;Spoon Oral Phase Functional Implications: Prolonged oral transit Pharyngeal Phase Impairments: Suspected delayed Swallow Other Comments: clinically pt appears with delayed swallow   Solid     Solid: Impaired Presentation: Self Fed;Spoon Oral Phase Functional Implications: Impaired mastication;Prolonged oral transit Pharyngeal Phase Impairments: Suspected delayed Swallow      Shawna Hopkins 06/29/2021,3:19 PM  Shawna Infante, MS [facility] SLP Acute Rehab Services Office [phone] Pager [phone]

## 2021-06-29 NOTE — H&P (Signed)
NEUROLOGY CONSULTATION NOTE   Date of service: June 29, 2021 Patient Name: Shawna Hopkins MRN:  [id number] DOB:  26-May-1928 Reason for consult: "hemorrhagic transformation of stroke" Requesting Provider: Stroke, Md, MD _ _ _   _ __   _ __ _ _  __ __   _ __   __ _  History of Present Illness  Shawna Hopkins is a 88 y.o. female with PMH significant for HTN, GERD who presented to [facility] with global aphasia, R sided weakness.  She was found by her family and was unable to speak. She was outside window for tNKASe on arrival with an initial NIHSS of 14. CTH with L MCA stroke with aspects of 7. CTA with left distal M2/3 occlusion, CTP with 65ml mismatch. Occlusion was felt to be distal for [facility] team and so she was admitted to [facility].  MRI Brain with acute L MCA stroke along with SWI images demonstrating hemorrhage. On review of the MRI Brain, shows more than petechial hemorrhage and probably some hemorrhagic transformation.  She was transferred to [facility] for closer monitoring and for further evaluation and workup.  Repeat CTH with stable hemorrhage.  On evaluation, patient has aphasia with no weakness.  NIHSS components Score: Comment  1a Level of Conscious 0[x]  1[]  2[]  3[]      1b LOC Questions 0[]  1[]  2[x]       1c LOC Commands 0[]  1[]  2[x]       2 Best Gaze 0[x]  1[]  2[]       3 Visual 0[x]  1[]  2[]  3[]      4 Facial Palsy 0[x]  1[]  2[]  3[]      5a Motor Arm - left 0[x]  1[]  2[]  3[]  4[]  UN[]    5b Motor Arm - Right 0[x]  1[]  2[]  3[]  4[]  UN[]    6a Motor Leg - Left 0[x]  1[]  2[]  3[]  4[]  UN[]    6b Motor Leg - Right 0[x]  1[]  2[]  3[]  4[]  UN[]    7 Limb Ataxia 0[x]  1[]  2[]  3[]  UN[]     8 Sensory 0[x]  1[]  2[]  UN[]      9 Best Language 0[]  1[]  2[]  3[x]      10 Dysarthria 0[]  1[]  2[x]  UN[]      11 Extinct. and Inattention 0[x]  1[]  2[]       TOTAL: 9      ROS   Unable to obtain 2/2 aphasia.  Past History   Past Medical History:  Diagnosis Date   Hypertension    Past Surgical History:   Procedure Laterality Date   ABDOMINAL SURGERY     Galbladder   No family history on file. Social History   Socioeconomic History   Marital status: Widowed    Spouse name: Not on file   Number of children: Not on file   Years of education: Not on file   Highest education level: Not on file  Occupational History   Not on file  Tobacco Use   Smoking status: Never   Smokeless tobacco: Never  Substance and Sexual Activity   Alcohol use: Not Currently   Drug use: Never   Sexual activity: Not on file  Other Topics Concern   Not on file  Social History Narrative   Not on file   Social Determinants of Health   Financial Resource Strain: Not on file  Food Insecurity: Not on file  Transportation Needs: Not on file  Physical Activity: Not on file  Stress: Not on file  Social Connections: Not on file   No Known Allergies  Medications  No medications prior to admission.     Vitals   Vitals:   06/29/21 0100  BP: (!) 144/64  Pulse: 95  Resp: 16  Temp: 98.3 F (36.8 C)  TempSrc: Oral  SpO2: 94%  Weight: 64.9 kg     Body mass index is 23.81 kg/m.  Physical Exam   General: Laying comfortably in bed; in no acute distress. HENT: Normal oropharynx and mucosa. Normal external appearance of ears and nose. Neck: Supple, no pain or tenderness CV: No JVD. No peripheral edema. Pulmonary: Symmetric Chest rise. Normal respiratory effort. Abdomen: Soft to touch, non-tender.  Ext: No cyanosis, edema, or deformity  Skin: No rash. Normal palpation of skin.   Musculoskeletal: Normal digits and nails by inspection. No clubbing.   Neurologic Examination  Mental status/Cognition: Alert, Awake, unable to assess orientation 2/2 aphasia. Speech/language: Non fluent, unable to comprehend, unable to name objects, unable to repeat. Attempts to mimic or follow verbal cues. Cranial nerves:   CN II Pupils equal and reactive to light, no VF deficits    CN III,IV,VI EOM intact, no gaze  preference or deviation, no nystagmus    CN V normal sensation in V1, V2, and V3 segments bilaterally    CN VII no asymmetry, no nasolabial fold flattening    CN VIII Turns head towards speech   CN IX & X normal palatal elevation, no uvular deviation    CN XI 5/5 head turn and 5/5 shoulder shrug bilaterally    CN XII midline tongue protrusion    Motor:  Muscle bulk: poor, tone normal. Aphasia precludes detailed strength testing. Able to keep both arms off the bed for more than 10 secs. 5/5 grip strength BL.  Able to keep both legs off the bed for more than 5 secs.  Reflexes:  Right Left Comments  Pectoralis      Biceps (C5/6) 1 1   Brachioradialis (C5/6) 1 1    Triceps (C6/7) 1 1    Patellar (L3/4) 1 1    Achilles (S1)      Hoffman      Plantar     Jaw jerk    Sensation:  Light touch Regards touch in all extremitie.   Pin prick    Temperature    Vibration   Proprioception    Coordination/Complex Motor:  Unable to assess but no obvious ataxia on limb movements. - Gait: unsafe to assess.  Labs   CBC:  Recent Labs  Lab 06/28/21 1552  WBC 10.5  NEUTROABS 9.5*  HGB 11.5*  HCT 34.9*  MCV 87.9  PLT 230    Basic Metabolic Panel:  Lab Results  Component Value Date   NA 132 (L) 06/28/2021   K 3.4 (L) 06/28/2021   CO2 23 06/28/2021   GLUCOSE 143 (H) 06/28/2021   BUN 24 (H) 06/28/2021   CREATININE 0.93 06/28/2021   CALCIUM 9.0 06/28/2021   GFRNONAA 58 (L) 06/28/2021   GFRAA >60 02/29/2020   Lipid Panel: No results found for: LDLCALC HgbA1c: No results found for: HGBA1C Urine Drug Screen:     Component Value Date/Time   LABOPIA NONE DETECTED 06/28/2021 1552   COCAINSCRNUR NONE DETECTED 06/28/2021 1552   LABBENZ NONE DETECTED 06/28/2021 1552   AMPHETMU NONE DETECTED 06/28/2021 1552   THCU NONE DETECTED 06/28/2021 1552   LABBARB NONE DETECTED 06/28/2021 1552    Alcohol Level     Component Value Date/Time   ETH <10 06/28/2021 1552    CT Head without  contrast(Personally reviewed): 1. Small volume petechial hemorrhage of the left frontal lobe without mass effect. 2. Hypodensity of the white matter is most commonly associated with chronic microvascular disease.  CT angio Head and Neck with contrast(Personally reviewed): Distal left M2/proximal M3 occlusion.  MRI Brain(Personally reviewed): L MCA stroke with hemorrhagic transformation Impression   Shawna Hopkins is a 87 y.o. female with PMH significant for HTN, GERD who presents with L MCA stroke. Likely embolic as she has new onset Afibb noted on monitoring at [facility]. Her neurologic examination is notable for aphasia.  Primary Diagnosis:  Cerebral infarction due to embolism of  left middle cerebral artery.  Hemorrhagic transformation of stroke.  Secondary Diagnosis: Essential (primary) hypertension  Recommendations   L MCA embolic stroke with hemorrhagic transformation: - Admit to ICU - Stability scan in 6 hours or STAT with any neurological decline - Frequent neuro checks; q73min for 1 hour, then q1hour - No antiplatelets or anticoagulants unless ICH stable in size. - SCD for DVT prophylaxis, pharmacological DVT ppx at 24 hours if ICH is stable - Blood pressure control with goal systolic of less than 0000000, cleverplex and labetalol PRN - Stroke labs, HgbA1c, fasting lipid panel - Risk factor modification - Echocardiogram - PT consult, OT consult, Speech consult. - Stroke team to follow   HTN: Goal SBP as above. Cleviprex and labetalol PRN. Can resume home HCTZ   New onset Afibb: Noted at [facility] on tele. I am unable to review the strips or EKG to verify. - Will get EKG - PRN Metoprolol 2.5mg  for SBP > 120.   ______________________________________________________________________  This patient is critically ill and at significant risk of neurological worsening, death and care requires constant monitoring of vital signs, hemodynamics,respiratory and cardiac monitoring,  neurological assessment, discussion with family, other specialists and medical decision making of high complexity. I spent 35 minutes of neurocritical care time  in the care of  this patient. This was time spent independent of any time provided by nurse practitioner or PA.  Donnetta Simpers [facility] Pager Number [phone] 06/29/2021  3:39 AM   Thank you for the opportunity to take part in the care of this patient. If you have any further questions, please contact the neurology consultation attending.  Signed,  [place] Pager Number [phone] _ _ _   _ __   _ __ _ _  __ __   _ __   __ _

## 2021-06-29 NOTE — Progress Notes (Signed)
°  Echocardiogram 2D Echocardiogram has been performed.  Gerda Diss 06/29/2021, 3:58 PM

## 2021-06-29 NOTE — Progress Notes (Addendum)
STROKE TEAM PROGRESS NOTE   INTERVAL HISTORY Patient is seen in her room with her RN at the bedside.  She presented last night with global aphasia and right sided weakness.  She was unable to receive TNK as she presented outside the window, and hemorrhagic transformation of her infarct was noted on MRI.  She remains aphasic without weakness.  CT head reveals stable hemorrhage.  Vitals:   06/29/21 0800 06/29/21 0900 06/29/21 1000 06/29/21 1100  BP: 126/72 121/77 134/63 134/62  Pulse: 93 84 75 73  Resp: (!) 23 (!) 27 (!) 21 (!) 22  Temp: 99.3 F (37.4 C)  98.2 F (36.8 C)   TempSrc: Bladder  Oral   SpO2: 97% 97% 94% 92%  Weight:       CBC:  Recent Labs  Lab 06/28/21 1552  WBC 10.5  NEUTROABS 9.5*  HGB 11.5*  HCT 34.9*  MCV 87.9  PLT 123456   Basic Metabolic Panel:  Recent Labs  Lab 06/28/21 1552  NA 132*  K 3.4*  CL 99  CO2 23  GLUCOSE 143*  BUN 24*  CREATININE 0.93  CALCIUM 9.0   Lipid Panel:  Recent Labs  Lab 06/29/21 0454  CHOL 134  TRIG 52  HDL 55  CHOLHDL 2.4  VLDL 10  LDLCALC 69   HgbA1c:  Recent Labs  Lab 06/29/21 0454  HGBA1C 6.0*   Urine Drug Screen:  Recent Labs  Lab 06/28/21 1552  LABOPIA NONE DETECTED  COCAINSCRNUR NONE DETECTED  LABBENZ NONE DETECTED  AMPHETMU NONE DETECTED  THCU NONE DETECTED  LABBARB NONE DETECTED    Alcohol Level  Recent Labs  Lab 06/28/21 1552  ETH <10    IMAGING past 24 hours CT HEAD WO CONTRAST (5MM)  Result Date: 06/29/2021 CLINICAL DATA:  87 year old female with left MCA M2/proximal M3 occlusion and MCA territory infarct. Left ICA 6 mm aneurysm. EXAM: CT HEAD WITHOUT CONTRAST TECHNIQUE: Contiguous axial images were obtained from the base of the skull through the vertex without intravenous contrast. COMPARISON:  Brain MRI, head CT 0251 hours today. CTA head and neck 06/28/2021. FINDINGS: Brain: No significant change an petechial hemorrhage in the left middle MCA territory, now most conspicuous on series 2,  image 17. Regional cytotoxic edema corresponding to abnormal diffusion. No significant midline shift or intracranial mass effect. Stable gray-white matter differentiation elsewhere. No extra-axial hemorrhage. Vascular: Calcified atherosclerosis at the skull base. Distal left ICA 6 mm aneurysm occult by plain CT. Skull: No acute osseous abnormality identified. Sinuses/Orbits: Visualized paranasal sinuses and mastoids are clear. Other: No acute orbit or scalp soft tissue finding. IMPRESSION: 1. No significant change since 0251 hours in the left middle MCA territory petechial hemorrhage and cytotoxic edema. No significant mass effect. 2. No new intracranial abnormality. Distal left ICA 6 mm aneurysm occult by plain CT. Electronically Signed   By: Genevie Ann M.D.   On: 06/29/2021 10:35   CT HEAD WO CONTRAST  Result Date: 06/29/2021 CLINICAL DATA:  Stroke follow-up EXAM: CT HEAD WITHOUT CONTRAST TECHNIQUE: Contiguous axial images were obtained from the base of the skull through the vertex without intravenous contrast. COMPARISON:  06/28/2021 FINDINGS: Brain: There is small volume petechial hemorrhage of the left frontal lobe. No mass effect. There is generalized atrophy without lobar predilection. Hypodensity of the white matter is most commonly associated with chronic microvascular disease. Vascular: No abnormal hyperdensity of the major intracranial arteries or dural venous sinuses. No intracranial atherosclerosis. Skull: The visualized skull base, calvarium and extracranial  soft tissues are normal. Sinuses/Orbits: No fluid levels or advanced mucosal thickening of the visualized paranasal sinuses. No mastoid or middle ear effusion. The orbits are normal. IMPRESSION: 1. Small volume petechial hemorrhage of the left frontal lobe without mass effect. 2. Hypodensity of the white matter is most commonly associated with chronic microvascular disease. Electronically Signed   By: Deatra Robinson M.D.   On: 06/29/2021 03:07    MR BRAIN WO CONTRAST  Result Date: 06/28/2021 CLINICAL DATA:  Neuro deficit, acute, stroke suspected. EXAM: MRI HEAD WITHOUT CONTRAST TECHNIQUE: Multiplanar, multiecho pulse sequences of the brain and surrounding structures were obtained without intravenous contrast. COMPARISON:  Noncontrast head CT and CT angiogram head/neck 06/28/2021. FINDINGS: Brain: Mild generalized cerebral and cerebellar atrophy. Multiple acute cortical/subcortical infarcts within the mid-to-posterior left frontal lobe, left frontal operculum and left insula. The largest infarct measures 2.8 x 2.6 cm x 4.4 cm. There is gyriform SWI signal loss associated with this largest infarct compatible with petechial hemorrhage/hemorrhagic conversion. There is only mild local mass effect. A few small acute cortically-based infarcts are also present within the left parietal and temporal lobes. Background advanced patchy and confluent T2 FLAIR hyperintense signal abnormality within the cerebral white matter, nonspecific but compatible with chronic small vessel ischemic disease. Minimal chronic small-vessel ischemic changes are also present within the pons. Chronic infarcts within the bilateral basal ganglia. Punctate chronic microhemorrhages within the medial left parietooccipital lobes and left thalamus. Small chronic infarcts within the bilateral cerebellar hemispheres. No evidence of an intracranial mass. No extra-axial fluid collection. No midline shift. Vascular: Curvilinear SWI signal loss at the posterior aspect of the left sylvian fissure, likely reflecting endoluminal thrombus within a distal M2/proximal M3 left MCA vessel, as described on the CTA performed earlier today. Flow voids otherwise preserved within the proximal large arterial vessels. Skull and upper cervical spine: No focal suspicious marrow lesion. Incompletely assessed cervical spondylosis. Sinuses/Orbits: Visualized orbits show no acute finding. Right lens replacement. Mild  mucosal thickening within the bilateral ethmoid sinuses. Impression #1 was called by telephone at the time of interpretation on 06/28/2021 at 8:14 pm to provider Dr. Derry Lory, who verbally acknowledged these results. IMPRESSION: Multiple acute cortical/subcortical left MCA territory infarcts within the left frontal, parietal and temporal lobes. The largest infarct within the mid-to-posterior left frontal lobe, left frontal operculum and left insula measures 2.8 x 2.6 x 4.4 cm. SWI signal loss is present at site of this dominant infarct, compatible with petechial hemorrhage/hemorrhagic conversion. There is only mild local mass effect. Background advanced chronic small vessel changes within the cerebral white matter. Mild chronic small vessel ischemic changes are also present within the pons. Chronic lacunar infarcts within the bilateral basal ganglia. Small chronic infarcts within the bilateral cerebellar hemispheres. Mild generalized parenchymal atrophy. Electronically Signed   By: Jackey Loge D.O.   On: 06/28/2021 20:18   DG Chest Portable 1 View  Result Date: 06/28/2021 CLINICAL DATA:  New onset atrial fibrillation.  Acute stroke. EXAM: PORTABLE CHEST 1 VIEW COMPARISON:  02/29/2020 FINDINGS: Heart size upper limits of normal. Calcification and tortuosity of the aorta. Mildly prominent interstitial markings in the mid and lower lungs, improved since the study of February 29, 2020 where there was quite likely acute pneumonia. Today's markings could be chronic or indicate mild active inflammatory disease. No consolidation or collapse. No effusion. Bony structures unremarkable. IMPRESSION: No consolidation, collapse, frank edema or effusion. Slightly prominent markings at the lung bases which may be residua of previous pneumonia. Electronically Signed  By: Nelson Chimes M.D.   On: 06/28/2021 16:28   CT HEAD CODE STROKE WO CONTRAST  Result Date: 06/28/2021 CLINICAL DATA:  Code stroke. Provided history: Neuro  deficit, acute, stroke suspected; right-sided weakness. Additional history provided: Aphasia, right-sided weakness. EXAM: CT HEAD WITHOUT CONTRAST TECHNIQUE: Contiguous axial images were obtained from the base of the skull through the vertex without intravenous contrast. COMPARISON:  No pertinent prior exams available for comparison. FINDINGS: Brain: Mild generalized cerebral and cerebellar atrophy. Abnormal cortical/subcortical hypodensity with loss of gray-white differentiation within portions of the left frontal operculum and left insula, compatible with acute left MCA territory infarct. No significant mass effect at this time. No evidence of hemorrhagic conversion. Background advanced patchy and confluent hypoattenuation within the cerebral white matter, nonspecific but compatible with chronic small vessel ischemic disease. Small chronic infarcts within the basal ganglia and likely within the left cerebellar hemisphere. No extra-axial fluid collection. No evidence of an intracranial mass. No midline shift. Vascular: Asymmetric hyperdensity of a proximal M2 left MCA vessel suspicious for endoluminal thrombus at this site (for instance as seen on series 3, image 13). Atherosclerotic calcifications. Skull: Normal. Negative for fracture or focal lesion. Sinuses/Orbits: Leftward gaze. Trace mucosal thickening within the bilateral ethmoid air cells. ASPECTS [facility] Stroke Program Early CT Score) - Ganglionic level infarction (caudate, lentiform nuclei, internal capsule, insula, M1-M3 cortex): 5 - Supraganglionic infarction (M4-M6 cortex): 2 Total score (0-10 with 10 being normal): 7 These results were called by telephone at the time of interpretation on 06/28/2021 at 3:38 pm to provider Dr. Quinn Axe, who verbally acknowledged these results. IMPRESSION: Acute left MCA territory cortical/subcortical infarct within the left frontal operculum and left insula. ASPECTS is 7. No evidence of hemorrhagic conversion. No  significant mass effect at this time. Background advanced chronic small vessel ischemic changes within the cerebral white matter. Chronic infarcts within the bilateral basal ganglia and likely left cerebellar hemisphere. Mild generalized parenchymal atrophy. Electronically Signed   By: Kellie Simmering D.O.   On: 06/28/2021 15:41   CT ANGIO HEAD NECK W WO CM W PERF (CODE STROKE)  Result Date: 06/28/2021 CLINICAL DATA:  Neuro deficit, acute, stroke suspected. Additional history provided: Aphasia, right-sided weakness. EXAM: CT ANGIOGRAPHY HEAD AND NECK CT PERFUSION BRAIN TECHNIQUE: Multidetector CT imaging of the head and neck was performed using the standard protocol during bolus administration of intravenous contrast. Multiplanar CT image reconstructions and MIPs were obtained to evaluate the vascular anatomy. Carotid stenosis measurements (when applicable) are obtained utilizing NASCET criteria, using the distal internal carotid diameter as the denominator. Multiphase CT imaging of the brain was performed following IV bolus contrast injection. Subsequent parametric perfusion maps were calculated using RAPID software. CONTRAST:  190mL OMNIPAQUE IOHEXOL 350 MG/ML SOLN COMPARISON:  Noncontrast head CT performed earlier today 06/28/2021. FINDINGS: CTA NECK FINDINGS Aortic arch: Common origin of the innominate and left common carotid arteries. Atherosclerotic plaque within the visualized aortic arch and proximal major branch vessels of the neck. No hemodynamically significant innominate or proximal subclavian artery stenosis. Right carotid system: CCA and ICA patent within the neck without stenosis or significant atherosclerotic disease. Tortuosity of the cervical ICA. Left carotid system: CCA and ICA patent within the neck without hemodynamically significant stenosis (50% or greater). Minimal atherosclerotic plaque at the CCA origin and within the proximal ICA. Tortuosity of the cervical ICA. Vertebral arteries:  Vertebral arteries codominant and patent within the neck. Mild nonstenotic atherosclerotic plaque at the origin of the right vertebral artery. Skeleton: Trace C2-C3  grade 1 retrolisthesis. Trace C7-T1 grade 1 anterolisthesis. Cervical spondylosis. C3-C4 and C4-C5 ankylosis. No acute bony abnormality or aggressive osseous lesion. Other neck: Subcentimeter nodules within the right thyroid lobe, not meeting consensus criteria for ultrasound follow-up based on size. No cervical lymphadenopathy. Upper chest: No consolidation within the imaged lung apices. Borderline enlarged right tracheobronchial lymph node measuring 11 mm in short axis (series 9, image 2). Review of the MIP images confirms the above findings CTA HEAD FINDINGS Anterior circulation: The intracranial internal carotid arteries are patent. Nonstenotic atherosclerotic plaque within both vessels. 6 x 6 mm inferomedially projecting saccular aneurysm arising from the distal cavernous/paraclinoid left ICA (for instance as seen on series 9, image 248) (series 12, image 116). Separate 2 mm inferiorly projecting vascular protrusion arising from the paraclinoid left ICA more laterally, which may reflect an infundibulum or additional aneurysm (for instance as seen on series 9, image 251) (series 12, image 120). The M1 middle cerebral arteries are patent. No right M2 proximal branch occlusion or high-grade proximal stenosis is identified. There is occlusion of a distal M2/proximal M3 left MCA vessel (series 14, image 31) (series 10, image 56). The anterior cerebral arteries are patent. Posterior circulation: The intracranial vertebral arteries are patent. The basilar artery is patent. The posterior cerebral arteries are patent. A right posterior communicating artery is present. The left posterior communicating artery is diminutive or absent. Venous sinuses: Within the limitations of contrast timing, no convincing thrombus. Anatomic variants: As described. Review of  the MIP images confirms the above findings CT Brain Perfusion Findings: CBF (<30%) Volume: 50mL Perfusion (Tmax>6.0s) volume: 61mL (within the posterior left MCA vascular territory). Mismatch Volume: 53mL Infarction Location:None identified Emergent findings discussed with Dr. Quinn Axe at 4 p.m. on 06/28/2021 by telephone. IMPRESSION: CTA neck: 1. The common carotid, internal carotid and vertebral arteries are patent within the neck without significant stenosis. Mild atherosclerotic disease within the major arterial vessels of the neck, as described. 2.  Aortic Atherosclerosis (ICD10-I70.0). 3. Partially imaged borderline enlarged right tracheobronchial mediastinal lymph node, nonspecific. Non-emergent dedicated CT imaging of the chest may be obtained for further evaluation, as clinically warranted. CTA head: 1. Occlusion of a distal M2/proximal M3 left MCA vessel. 2. Non-stenotic atherosclerotic plaque within the intracranial internal carotid arteries, bilaterally. 3. 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 4. Adjacent 2 mm aneurysm versus infundibulum arising from the paraclinoid left ICA. CT perfusion head: The perfusion software identifies a 10 mL region of critically hypoperfused parenchyma within the posterior left MCA vascular territory ( Tmax>6 seconds threshold). The perfusion software identifies no core infarct. However, there are clear changes of acute infarction on the concurrently performed noncontrast head CT. The perfusion software reports a mismatch volume of 10 mL. Electronically Signed   By: Kellie Simmering D.O.   On: 06/28/2021 16:14    PHYSICAL EXAM General: Alert, well-developed female in no acute distress  Neurological:  Difficult to assess mental status as patient is mute and aphasic.  Patient cannot follow commands but will attempt to mimic actions . PERRL, EOMI.  Full strength and purposeful movement noted in all  extremities.  ASSESSMENT/PLAN Ms. VIVIENE WAECHTER is a 87 y.o. female with history of HTN and GERD presenting with aphasia and right sided weakness.  She was unable to receive TNK as she presented outside the window, and hemorrhagic transformation of her infarct was noted on MRI.  She remains aphasic without weakness.  CT head reveals stable  hemorrhage.  Stroke:  left of left middle cerebral artery infarct with hemorrhagic conversion Code Stroke CT head Acute left  MCA territory cortical/subcortical infarct with no evidence of hemorrhagic cnversion. Small vessel disease. Atrophy. ASPECTS 7 CTA head & neck No significant stenosis of vessels in the neck, Occlusion of distal M2, proximal M3 left MCA vessel, 6x76mm aneurysm projecting from distal cavernous/paraclinoid left ICA CT perfusion 10 mL region of hypoperfused parenchyma with no core infarct identified MRI  Multiple acute cortical/subcortical infarcts in left MCA territory with hemorrhagic conversion, chornic lacunar basal ganglia infarcts, small chronic infarcts in cerebellar hemispheres and mild generalized atrophy 2D Echo pending Repeat CT head No significant change in left middle MCA hemorrhage LDL 69 HgbA1c 6.0 VTE prophylaxis - SCDs    Diet   Diet NPO time specified   No antithrombotic prior to admission, now on No antithrombotic secondary to hemorraghic conversion of stroke Therapy recommendations:  pending Disposition:  pending  Hypertension Home meds:  none Stable Keep SBP <160 Long-term BP goal normotensive  Hyperlipidemia Home meds:  none LDL 69, goal < 70 High intensity statin not indicated due to advanced age and LDL below goal Continue statin at discharge   Other Stroke Risk Factors Advanced Age >/= 33   Other Active Problems Borderline enlarged right tracheobronchial mediastinal lymph node Consider CT chest  Hospital day # [place] , MSN, AGACNP-BC [facility] See Amion  for schedule and pager information 06/29/2021 11:34 AM  ATTENDING ATTESTATION: Agree with above note. Head CT repeated this am, stable. 50mm distal left ICA aneurysm noted. She has afib on telemetry. Hold antiplatelet/AC for now.  Dr. Reeves Forth evaluated pt independently, reviewed imaging, chart, labs. Discussed and formulated plan with the APP. Please see APP note above for details.   Total 30 minutes spent on counseling patient and coordinating care, writing notes and reviewing chart.  Gleen Ripberger,MD     To contact Stroke Continuity provider, please refer to [email or web address] After hours, contact General Neurology

## 2021-06-29 NOTE — Progress Notes (Signed)
Swallow evaluation completed, full report to follow. Pt with global aphasia - difficulty following directions or voicing.  She does demonstrate right facial asymmetry - can not view palate for elevation evaluation = able to seal lips on straw.  SLP provided pt with oral care using suctioning- viscous white tinged secretions removed after mouth care and minimal intake - Upper denture in place and lower teeth present.    Pt able to self feed water, nectar juice, applesauce and solid - soft cracker. Significant delay in swallow noted *presumed oral more than pharyngeal given Left MCA CVA.  Subtle throat clear x1 after water consumed and cough x1 with sequential swallows of nectar via straw that may indicate laryngeal infiltration.  PT does have cervical spondylosis per prior imaging at C3-C4 as well as bilateral pna when she had COVID per her grandson.    No s/s of aspiration with tsps of thin water, small boluses of nectar, puree, and cracker.  No oral retention present after observation.   Recommend dys1/nectar - and allow tsps of thin.  SLP will follow up next date for swallow management and language evaluation.   Educated family to basic information re: language deficits with her event.     Rolena Infante, MS [facility] SLP Acute Rehab Services Office [phone] Pager [phone]

## 2021-06-30 ENCOUNTER — Inpatient Hospital Stay (HOSPITAL_COMMUNITY): Payer: Medicare Other

## 2021-06-30 DIAGNOSIS — I483 Typical atrial flutter: Secondary | ICD-10-CM

## 2021-06-30 DIAGNOSIS — I10 Essential (primary) hypertension: Secondary | ICD-10-CM

## 2021-06-30 LAB — GLUCOSE, CAPILLARY
Glucose-Capillary: 112 mg/dL — ABNORMAL HIGH (ref 70–99)
Glucose-Capillary: 102 mg/dL — ABNORMAL HIGH (ref 70–99)
Glucose-Capillary: 177 mg/dL — ABNORMAL HIGH (ref 70–99)
Glucose-Capillary: 161 mg/dL — ABNORMAL HIGH (ref 70–99)
Glucose-Capillary: 143 mg/dL — ABNORMAL HIGH (ref 70–99)
Glucose-Capillary: 139 mg/dL — ABNORMAL HIGH (ref 70–99)

## 2021-06-30 LAB — BASIC METABOLIC PANEL
Anion gap: 9 (ref 5–15)
BUN: 25 mg/dL — ABNORMAL HIGH (ref 8–23)
CO2: 25 mmol/L (ref 22–32)
Calcium: 8.6 mg/dL — ABNORMAL LOW (ref 8.9–10.3)
Chloride: 105 mmol/L (ref 98–111)
Creatinine, Ser: 1.08 mg/dL — ABNORMAL HIGH (ref 0.44–1.00)
GFR, Estimated: 48 mL/min — ABNORMAL LOW (ref >60)
Glucose, Bld: 107 mg/dL — ABNORMAL HIGH (ref 70–99)
Potassium: 3.4 mmol/L — ABNORMAL LOW (ref 3.5–5.1)
Sodium: 139 mmol/L (ref 135–145)

## 2021-06-30 LAB — CBC
HCT: 35 % — ABNORMAL LOW (ref 36.0–46.0)
Hemoglobin: 11.6 g/dL — ABNORMAL LOW (ref 12.0–15.0)
MCH: 29.3 pg (ref 26.0–34.0)
MCHC: 33.1 g/dL (ref 30.0–36.0)
MCV: 88.4 fL (ref 80.0–100.0)
Platelets: 211 10*3/uL (ref 150–400)
RBC: 3.96 MIL/uL (ref 3.87–5.11)
RDW: 12.2 % (ref 11.5–15.5)
WBC: 10.2 10*3/uL (ref 4.0–10.5)
nRBC: 0 % (ref 0.0–0.2)

## 2021-06-30 MED ORDER — ORAL CARE MOUTH RINSE
15.0000 mL | Freq: Two times a day (BID) | OROMUCOSAL | Status: DC
  Administered 2021-06-30 (×2): 15 mL via OROMUCOSAL

## 2021-06-30 MED ORDER — ASPIRIN EC 81 MG PO TBEC
81.0000 mg | DELAYED_RELEASE_TABLET | Freq: Every day | ORAL | Status: DC
  Administered 2021-06-30 – 2021-07-01 (×2): 81 mg via ORAL
  Filled 2021-06-30 (×2): qty 1

## 2021-06-30 MED ORDER — PANTOPRAZOLE SODIUM 40 MG PO TBEC
40.0000 mg | DELAYED_RELEASE_TABLET | Freq: Every day | ORAL | Status: DC
  Administered 2021-06-30 – 2021-07-01 (×2): 40 mg via ORAL
  Filled 2021-06-30 (×2): qty 1

## 2021-06-30 MED ORDER — ENOXAPARIN SODIUM 30 MG/0.3ML IJ SOSY
30.0000 mg | PREFILLED_SYRINGE | INTRAMUSCULAR | Status: DC
  Administered 2021-06-30 – 2021-07-01 (×2): 30 mg via SUBCUTANEOUS
  Filled 2021-06-30 (×2): qty 0.3

## 2021-06-30 NOTE — Evaluation (Signed)
Speech Language Pathology Evaluation Patient Details Name: Shawna Hopkins MRN: [id number] DOB: 12-29-1927 Today's Date: 06/30/2021 Time: 0911-1000 SLP Time Calculation (min) (ACUTE ONLY): 49 min  Problem List:  Patient Active Problem List   Diagnosis Date Noted   Hemorrhagic stroke (HCC) 06/29/2021   CVA (cerebral vascular accident) (HCC) 06/28/2021   Past Medical History:  Past Medical History:  Diagnosis Date   Hypertension    Past Surgical History:  Past Surgical History:  Procedure Laterality Date   ABDOMINAL SURGERY     Galbladder   HPI:  87 yo female who lives alone and has h/o HTN, GERD - was transferred [facility] with global aphasia, R sided weakness,  Found to have left mca cva.  Pt also with h/o Cervical spine C3-C4 and C4-C5 ankylosis per prior imaging. Brain imaging also has shown "Mild chronic small vessel ischemic changes are also present within the pons. Chronic lacunar infarcts within the bilateral basal ganglia., Small chronic infarcts within the bilateral cerebellar hemispheres."      PT remains globally aphasic, family denies pt having issues swallowing from their recollection.   Assessment / Plan / Recommendation Clinical Impression  Pt demonstrates global aphasia, unable to repeat words or sounds, some groping attempts to articulate with max visual cues. Pt able to approximate 3 words while single [employer]. Pt attentive to auditory comprehension assessment but was inaccurate in identifying objects or line drawings, as well as words. Y/N inaccurate. Pt did improve with teaching and was clearly using the process of elimination to try and make the correct selection. She used objects correctly, self fed, demonstrated basic problem solving and used gestures and choices to express wants and needs. Cognition is very good, but will need intensive language rehabilitation. Recommend CIR at d/c.    SLP Assessment  SLP Recommendation/Assessment: Patient needs continued  Speech Lanaguage Pathology Services SLP Visit Diagnosis: Aphasia (R47.01)    Recommendations for follow up therapy are one component of a multi-disciplinary discharge planning process, led by the attending physician.  Recommendations may be updated based on patient status, additional functional criteria and insurance authorization.    Follow Up Recommendations  Acute inpatient rehab (3hours/day)    Assistance Recommended at Discharge  Frequent or constant Supervision/Assistance  Functional Status Assessment Patient has had a recent decline in their functional status and demonstrates the ability to make significant improvements in function in a reasonable and predictable amount of time.  Frequency and Duration min 2x/week  2 weeks      SLP Evaluation Cognition  Overall Cognitive Status: Difficult to assess Orientation Level: Disoriented X4       Comprehension  Auditory Comprehension Overall Auditory Comprehension: Impaired Yes/No Questions: Impaired Basic Biographical Questions: 0-25% accurate Commands: Impaired One Step Basic Commands: 0-24% accurate Visual Recognition/Discrimination Discrimination: Exceptions to [facility] Common Objects: Unable to indentify Reading Comprehension Reading Status: Impaired    Expression Verbal Expression Overall Verbal Expression: Impaired Initiation: Impaired Automatic Speech: Singing Repetition: Impaired Level of Impairment: Word level Naming: Impairment Responsive: 0-25% accurate Confrontation: Impaired Common Objects: Unable to indentify Convergent: Not tested Divergent: Not tested Written Expression Dominant Hand: Right   Oral / Motor  Oral Motor/Sensory Function Overall Oral Motor/Sensory Function: Mild impairment Facial ROM: Within Functional Limits Facial Symmetry: Within Functional Limits Facial Strength: Within Functional Limits Facial Sensation: Reduced right;Suspected CN V (Trigeminal) dysfunction Lingual ROM: Within  Functional Limits Lingual Symmetry: Within Functional Limits Lingual Strength: Within Functional Limits Motor Speech Overall Motor Speech: Impaired Respiration: Within functional limits  Phonation: Normal Articulation: Impaired Level of Impairment: Word Motor Speech Errors: Groping for words;Inconsistent            Shawna Hopkins, Shawna Hopkins 06/30/2021, 11:22 AM

## 2021-06-30 NOTE — Evaluation (Addendum)
Occupational Therapy Evaluation Patient Details Name: Shawna Hopkins MRN: [id number] DOB: [birth year] Today's Date: 06/30/2021   History of Present Illness Pt is a 87yo female you presented to [facility] with global aphasia and R sided weakness. 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 PMH: HTN, GERD, h/o covid with bilat PNA   Clinical Impression   PTA pt lives alone independently and family assists with errands as pt did not drive.  Able to ambulate into the bathroom with min A, complete pericare and grooming at sink with mod A. High risk for falls at this time. Difficulty noted with sequencing with decreased awareness of errors. Given PLOF, recommend rehab at [facility]. Pt will most likley need assistance after DC from [facility]. Will follow.      Recommendations for follow up therapy are one component of a multi-disciplinary discharge planning process, led by the attending physician.  Recommendations may be updated based on patient status, additional functional criteria and insurance authorization.   Follow Up Recommendations  Acute inpatient rehab (3hours/day)    Assistance Recommended at Discharge    Functional Status Assessment  Patient has had a recent decline in their functional status and demonstrates the ability to make significant improvements in function in a reasonable and predictable amount of time.  Equipment Recommendations  None recommended by OT    Recommendations for Other Services Rehab consult     Precautions / Restrictions Precautions Precautions: Fall Precaution Comments: globally aphasic Restrictions Weight Bearing Restrictions: No      Mobility Bed Mobility Overal bed mobility: Needs Assistance Bed Mobility: Sit to Supine      Sit to supine: Min guard    Transfers Overall transfer level: Needs assistance Equipment used: 1 person hand held assist Transfers: Sit to/from Stand;Bed to chair/wheelchair/BSC Sit to Stand: Min  assist               Balance Overall balance assessment: Needs assistance Sitting-balance support: Feet supported;No upper extremity supported Sitting balance-Leahy Scale: Fair Sitting balance - Comments: pt able to don socks at EOB with min guard assist   Standing balance support: Single extremity supported Standing balance-Leahy Scale: Poor Standing balance comment: requires external support                           ADL either performed or assessed with clinical judgement   ADL Overall ADL's : Needs assistance/impaired Eating/Feeding: Minimal assistance   Grooming: Moderate assistance   Upper Body Bathing: Minimal assistance   Lower Body Bathing: Moderate assistance   Upper Body Dressing : Moderate assistance   Lower Body Dressing: Moderate assistance;Sit to/from stand   Toilet Transfer: Minimal assistance           Functional mobility during ADLs: Minimal assistance       Vision Baseline Vision/History: 1 Wears glasses Additional Comments: will further assess; At times appeared to be having difficulty seeing items/objects in her R visual field; blinks to threat B fields     Perception Perception Comments: will further assess   Praxis      Pertinent Vitals/Pain Pain Assessment: Faces Faces Pain Scale: No hurt     Hand Dominance Right   Extremity/Trunk Assessment Upper Extremity Assessment Upper Extremity Assessment: Generalized weakness RUE Deficits / Details: noted mild weakness but using functionally   Lower Extremity Assessment Lower Extremity Assessment: Defer to PT evaluation RLE Deficits / Details: unable to accurately assess due to pt's global aphasia however noted  difficulty clearing R foot during ambulation   Cervical / Trunk Assessment Cervical / Trunk Assessment: Normal   Communication Communication Communication: Receptive difficulties;Expressive difficulties   Cognition Arousal/Alertness: Awake/alert Behavior  During Therapy: Flat affect;Impulsive Overall Cognitive Status: Difficult to assess Area of Impairment: Problem solving;Safety/judgement;Following commands                       Following Commands: Follows one step commands with increased time;Follows one step commands inconsistently (benefits from demonstration, pt did recognize sock and with increased time put on socks at EOB) Safety/Judgement: Decreased awareness of safety;Decreased awareness of deficits   Problem Solving: Difficulty sequencing;Requires verbal cues;Requires tactile cues (increased time to don socks) General Comments: difficulty to assess due to global aphasia, pt answered "yes" to all questions including "What is your first name?" "i your name Manuela Schwartz?"     General Comments  HR max 150s    Exercises     Shoulder Instructions      Home Living Family/patient expects to be discharged to:: Private residence Living Arrangements: Alone Available Help at Discharge: Family;Available PRN/intermittently Type of Home: House Home Access: Stairs to enter [employer] of Steps: 2   Home Layout: One level     Bathroom Shower/Tub: Teacher, early years/pre: Standard     Home Equipment: Cane - single Barista (2 wheels)      Lives With: Alone    Prior Functioning/Environment Prior Level of Function : Independent/Modified Independent             Mobility Comments: amb without AD, doesn't drive - family does grocery shopping and takes her to her appointments ADLs Comments: per dtr pt was indep        OT Problem List: Decreased strength;Decreased activity tolerance;Impaired balance (sitting and/or standing);Impaired vision/perception;Decreased coordination;Decreased cognition;Decreased safety awareness;Decreased knowledge of use of DME or AE      OT Treatment/Interventions: Self-care/ADL training;Therapeutic exercise;Neuromuscular education;DME and/or AE  instruction;Therapeutic activities;Cognitive remediation/compensation;Visual/perceptual remediation/compensation;Patient/family education;Balance training    OT Goals(Current goals can be found in the care plan section) Acute Rehab OT Goals Patient Stated Goal: per family for theri Mom to get better OT Goal Formulation: With patient/family Time For Goal Achievement: 07/14/21 Potential to Achieve Goals: Good  OT Frequency: Min 2X/week   Barriers to D/C:            Co-evaluation              AM-PAC OT "6 Clicks" Daily Activity     Outcome Measure Help from another person eating meals?: A Little Help from another person taking care of personal grooming?: A Lot Help from another person toileting, which includes using toliet, bedpan, or urinal?: A Lot Help from another person bathing (including washing, rinsing, drying)?: A Lot Help from another person to put on and taking off regular upper body clothing?: A Little Help from another person to put on and taking off regular lower body clothing?: A Lot 6 Click Score: 14   End of Session Equipment Utilized During Treatment: Gait belt Nurse Communication: Mobility status  Activity Tolerance: Patient tolerated treatment well Patient left: in bed;with call bell/phone within reach;with nursing/sitter in room  OT Visit Diagnosis: Unsteadiness on feet (R26.81);Muscle weakness (generalized) (M62.81);Other symptoms and signs involving cognitive function;Cognitive communication deficit (R41.841) Symptoms and signs involving cognitive functions: Cerebral infarction                Time: [id number] OT Time Calculation (min): 23 min Charges:  OT Evaluation $OT Eval Moderate Complexity: 1 Mod OT Treatments $Self Care/Home Management : 8-22 mins  Luisa Dago, OT/L   Acute OT Clinical Specialist Acute Rehabilitation Services Pager [phone] Office [phone]   [facility] 06/30/2021, 12:56 PM

## 2021-06-30 NOTE — Evaluation (Signed)
Physical Therapy Evaluation Patient Details Name: Shawna Hopkins MRN: [id number] DOB: [birth year] Today's Date: 06/30/2021  History of Present Illness  Pt is a 87yo female you presented to [facility] with global aphasia and R sided weakness. 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 PMH: HTN, GERD, h/o covid with bilat PNA   Clinical Impression  Pt admitted with above. Pt awake and alert however presents with expressive and receptive aphasia. Pt answering yes to all questions. Pt benefits from demonstration to complete tasks. Pt impulsive with decreased to deficits and safety. Pt with noted mild R UE and LE weakness requiring assist for ambulation and transfers. Pt was indep and living alone PTA. Recommend inpatient rehab upon d/c for maximal functional recovery. Spoke with family regarding this recommendations and they inquired about rehab at [facility]. Case Management to address. Acute PT to cont to follow.     Recommendations for follow up therapy are one component of a multi-disciplinary discharge planning process, led by the attending physician.  Recommendations may be updated based on patient status, additional functional criteria and insurance authorization.  Follow Up Recommendations Acute inpatient rehab (3hours/day) - family asking if there is rehab at [facility]    Assistance Recommended at Discharge Frequent or constant Supervision/Assistance  Functional Status Assessment Patient has had a recent decline in their functional status and demonstrates the ability to make significant improvements in function in a reasonable and predictable amount of time.  Equipment Recommendations   (TBD at next venue)    Recommendations for Other Services Rehab consult     Precautions / Restrictions Precautions Precautions: Fall Precaution Comments: globally aphasic Restrictions Weight Bearing Restrictions: No      Mobility  Bed Mobility Overal bed mobility: Needs  Assistance Bed Mobility: Supine to Sit     Supine to sit: Min guard     General bed mobility comments: pt brought self up to long sit in bed per family and was trying to get OOB to use bathroom    Transfers Overall transfer level: Needs assistance Equipment used: 1 person hand held assist Transfers: Sit to/from Stand;Bed to chair/wheelchair/BSC Sit to Stand: Mod assist;+2 safety/equipment Stand pivot transfers: Mod assist;+2 safety/equipment         General transfer comment: max directional verbal and tactile cues to complete transfer to [facility] and then to chair    Ambulation/Gait Ambulation/Gait assistance: +2 physical assistance;Mod assist Gait Distance (Feet): 30 Feet (to the door and back) Assistive device: 2 person hand held assist Gait Pattern/deviations: Step-through pattern;Decreased stride length;Decreased dorsiflexion - right;Decreased weight shift to right Gait velocity: slow Gait velocity interpretation: <1.8 ft/sec, indicate of risk for recurrent falls   General Gait Details: pt with decreased R LE step height and length, increased use of R UE to push down into PT compared to L UE per RN who reports she was just holding her hand but pt wasn't pushing down into it  Stairs            Wheelchair Mobility    Modified Rankin (Stroke Patients Only) Modified Rankin (Stroke Patients Only) Pre-Morbid Rankin Score: No significant disability Modified Rankin: Moderate disability     Balance Overall balance assessment: Needs assistance Sitting-balance support: Feet supported;No upper extremity supported Sitting balance-Leahy Scale: Fair Sitting balance - Comments: pt able to don socks at EOB with min guard assist   Standing balance support: Single extremity supported Standing balance-Leahy Scale: Fair  Pertinent Vitals/Pain Pain Assessment: Faces Faces Pain Scale: No hurt Facial Expression: Relaxed, neutral Body  Movements: Absence of movements Muscle Tension: Relaxed Compliance with ventilator (intubated pts.): N/A Vocalization (extubated pts.): Talking in normal tone or no sound CPOT Total: 0    Home Living Family/patient expects to be discharged to:: Private residence Living Arrangements: Alone Available Help at Discharge: Family;Available PRN/intermittently Type of Home: House Home Access: Stairs to enter   [employer] of Steps: 2   Home Layout: One level Home Equipment: Cane - single Barista (2 wheels)      Prior Function Prior Level of Function : Independent/Modified Independent             Mobility Comments: amb without AD, doesn't drive - family does grocery shopping and takes her to her appointments ADLs Comments: per dtr pt was indep     Hand Dominance   Dominant Hand: Right    Extremity/Trunk Assessment   Upper Extremity Assessment Upper Extremity Assessment: RUE deficits/detail RUE Deficits / Details: noted mild weakness    Lower Extremity Assessment Lower Extremity Assessment: RLE deficits/detail RLE Deficits / Details: unable to accurately assess due to pt's global aphasia however noted difficulty clearing R foot during ambulation    Cervical / Trunk Assessment Cervical / Trunk Assessment: Normal  Communication   Communication: Receptive difficulties;Expressive difficulties  Cognition Arousal/Alertness: Awake/alert Behavior During Therapy: Flat affect;Impulsive Overall Cognitive Status: Impaired/Different from baseline Area of Impairment: Problem solving;Safety/judgement;Following commands                       Following Commands: Follows one step commands with increased time;Follows one step commands inconsistently (benefits from demonstration, pt did recognize sock and with increased time put on socks at EOB) Safety/Judgement: Decreased awareness of safety;Decreased awareness of deficits (impulsive, trying to get OOB to  pee)   Problem Solving: Difficulty sequencing;Requires verbal cues;Requires tactile cues (increased time to don socks) General Comments: difficulty to assess due to global aphasia, pt answered "yes" to all questions including "What is your first name?" "i your name Manuela Schwartz?"        General Comments General comments (skin integrity, edema, etc.): pt assisted to [facility], RN provided pericare    Exercises     Assessment/Plan    PT Assessment Patient needs continued PT services  PT Problem List Decreased strength;Decreased mobility;Decreased balance;Decreased coordination;Decreased cognition;Decreased knowledge of use of DME       PT Treatment Interventions DME instruction;Gait training;Stair training;Functional mobility training;Therapeutic activities;Therapeutic exercise;Balance training;Neuromuscular re-education;Cognitive remediation    PT Goals (Current goals can be found in the Care Plan section)  Acute Rehab PT Goals Patient Stated Goal: unable to state PT Goal Formulation: With patient/family Time For Goal Achievement: 07/14/21 Potential to Achieve Goals: Good    Frequency Min 4X/week   Barriers to discharge        Co-evaluation               AM-PAC PT "6 Clicks" Mobility  Outcome Measure Help needed turning from your back to your side while in a flat bed without using bedrails?: A Little Help needed moving from lying on your back to sitting on the side of a flat bed without using bedrails?: A Little Help needed moving to and from a bed to a chair (including a wheelchair)?: A Lot Help needed standing up from a chair using your arms (e.g., wheelchair or bedside chair)?: A Lot Help needed to walk in hospital room?: A Lot  Help needed climbing 3-5 steps with a railing? : A Lot 6 Click Score: 14    End of Session Equipment Utilized During Treatment: Gait belt Activity Tolerance: Patient tolerated treatment well Patient left: in chair;with call bell/phone within  reach;with family/visitor present Nurse Communication: Mobility status PT Visit Diagnosis: Unsteadiness on feet (R26.81);History of falling (Z91.81);Difficulty in walking, not elsewhere classified (R26.2)    Time: [id number] PT Time Calculation (min) (ACUTE ONLY): 25 min   Charges:   PT Evaluation $PT Eval Moderate Complexity: 1 Mod PT Treatments $Gait Training: 8-22 mins        Kittie Plater, PT, DPT Acute Rehabilitation Services Pager #: [phone] Office #: [phone]   Berline Lopes 06/30/2021, 10:52 AM

## 2021-06-30 NOTE — Progress Notes (Addendum)
STROKE TEAM PROGRESS NOTE   INTERVAL HISTORY Patient is seen in her room with her RN and family at the bedside.  She presented last night with global aphasia and right sided weakness.  She was unable to receive TNK as she presented outside the window, and hemorrhagic transformation of her infarct was noted on MRI.  She remains aphasic without weakness.  CT head reveals stable hemorrhage.  She still has global aphasia but she is able to mimic actions with prompting.  Vitals:   06/30/21 0600 06/30/21 0700 06/30/21 0724 06/30/21 0800  BP:   (!) 142/96   Pulse: 78 81 80   Resp: (!) 26 (!) 21 15   Temp:    98.3 F (36.8 C)  TempSrc:    Oral  SpO2: 98% 98% 98%   Weight:       CBC:  Recent Labs  Lab 06/28/21 1552 06/29/21 1414 06/30/21 0515  WBC 10.5 8.2 10.2  NEUTROABS 9.5*  --   --   HGB 11.5* 11.9* 11.6*  HCT 34.9* 36.9 35.0*  MCV 87.9 88.9 88.4  PLT 230 214 [id number]    Basic Metabolic Panel:  Recent Labs  Lab 06/29/21 1414 06/30/21 0515  NA 137 139  K 3.4* 3.4*  CL 103 105  CO2 25 25  GLUCOSE 130* 107*  BUN 21 25*  CREATININE 1.08* 1.08*  CALCIUM 9.0 8.6*    Lipid Panel:  Recent Labs  Lab 06/29/21 0454  CHOL 134  TRIG 52  HDL 55  CHOLHDL 2.4  VLDL 10  LDLCALC 69    HgbA1c:  Recent Labs  Lab 06/29/21 0454  HGBA1C 6.0*    Urine Drug Screen:  Recent Labs  Lab 06/28/21 1552  LABOPIA NONE DETECTED  COCAINSCRNUR NONE DETECTED  LABBENZ NONE DETECTED  AMPHETMU NONE DETECTED  THCU NONE DETECTED  LABBARB NONE DETECTED     Alcohol Level  Recent Labs  Lab 06/28/21 1552  ETH <10     IMAGING past 24 hours CT HEAD WO CONTRAST (5MM)  Result Date: 06/29/2021 CLINICAL DATA:  87 year old female with left MCA M2/proximal M3 occlusion and MCA territory infarct. Left ICA 6 mm aneurysm. EXAM: CT HEAD WITHOUT CONTRAST TECHNIQUE: Contiguous axial images were obtained from the base of the skull through the vertex without intravenous contrast. COMPARISON:  Brain  MRI, head CT 0251 hours today. CTA head and neck 06/28/2021. FINDINGS: Brain: No significant change an petechial hemorrhage in the left middle MCA territory, now most conspicuous on series 2, image 17. Regional cytotoxic edema corresponding to abnormal diffusion. No significant midline shift or intracranial mass effect. Stable gray-white matter differentiation elsewhere. No extra-axial hemorrhage. Vascular: Calcified atherosclerosis at the skull base. Distal left ICA 6 mm aneurysm occult by plain CT. Skull: No acute osseous abnormality identified. Sinuses/Orbits: Visualized paranasal sinuses and mastoids are clear. Other: No acute orbit or scalp soft tissue finding. IMPRESSION: 1. No significant change since 0251 hours in the left middle MCA territory petechial hemorrhage and cytotoxic edema. No significant mass effect. 2. No new intracranial abnormality. Distal left ICA 6 mm aneurysm occult by plain CT. Electronically Signed   By: Genevie Ann M.D.   On: 06/29/2021 10:35   ECHOCARDIOGRAM COMPLETE  Result Date: 06/29/2021    ECHOCARDIOGRAM REPORT   Patient Name:   Shawna Hopkins Date of Exam: 06/29/2021 Medical Rec #:  [id number]         Height:       65.0 in Accession #:  [id number]        Weight:       143.1 lb Date of Birth:  [birth year]         BSA:          1.716 m Patient Age:    87 years          BP:           129/64 mmHg Patient Gender: F                 HR:           77 bpm. Exam Location:  Inpatient Procedure: 2D Echo, Cardiac Doppler and Color Doppler Indications:    Stroke  History:        Patient has prior history of Echocardiogram examinations. Risk                 Factors:Hypertension.  Sonographer:    Clayton Lefort RDCS (AE) Referring Phys: [id number] Shepherd  1. Left ventricular ejection fraction, by estimation, is 60 to 65%. The left ventricle has normal function. The left ventricle has no regional wall motion abnormalities. There is moderate concentric left ventricular  hypertrophy. Left ventricular diastolic function could not be evaluated.  2. Right ventricular systolic function is normal. The right ventricular size is normal. There is normal pulmonary artery systolic pressure.  3. Left atrial size was moderately dilated.  4. Right atrial size was moderately dilated.  5. The mitral valve is normal in structure. Trivial mitral valve regurgitation. No evidence of mitral stenosis.  6. Tricuspid valve regurgitation is mild to moderate.  7. The aortic valve is tricuspid. There is mild calcification of the aortic valve. Aortic valve regurgitation is not visualized. Aortic valve sclerosis/calcification is present, without any evidence of aortic stenosis.  8. The inferior vena cava is normal in size with greater than 50% respiratory variability, suggesting right atrial pressure of 3 mmHg. FINDINGS  Left Ventricle: Left ventricular ejection fraction, by estimation, is 60 to 65%. The left ventricle has normal function. The left ventricle has no regional wall motion abnormalities. The left ventricular internal cavity size was normal in size. There is  moderate concentric left ventricular hypertrophy. Left ventricular diastolic function could not be evaluated due to atrial fibrillation. Left ventricular diastolic function could not be evaluated. Right Ventricle: The right ventricular size is normal. No increase in right ventricular wall thickness. Right ventricular systolic function is normal. There is normal pulmonary artery systolic pressure. The tricuspid regurgitant velocity is 2.44 m/s, and  with an assumed right atrial pressure of 8 mmHg, the estimated right ventricular systolic pressure is 99991111 mmHg. Left Atrium: Left atrial size was moderately dilated. Right Atrium: Right atrial size was moderately dilated. Pericardium: There is no evidence of pericardial effusion. Mitral Valve: The mitral valve is normal in structure. There is mild calcification of the mitral valve leaflet(s).  Trivial mitral valve regurgitation. No evidence of mitral valve stenosis. MV peak gradient, 7.1 mmHg. The mean mitral valve gradient is 2.0  mmHg. Tricuspid Valve: The tricuspid valve is normal in structure. Tricuspid valve regurgitation is mild to moderate. No evidence of tricuspid stenosis. Aortic Valve: The aortic valve is tricuspid. There is mild calcification of the aortic valve. Aortic valve regurgitation is not visualized. Aortic valve sclerosis/calcification is present, without any evidence of aortic stenosis. Aortic valve mean gradient measures 3.0 mmHg. Aortic valve peak gradient measures 5.0 mmHg. Aortic valve area, by VTI measures 1.77 cm. Pulmonic Valve: The pulmonic valve  was not well visualized. Pulmonic valve regurgitation is not visualized. No evidence of pulmonic stenosis. Aorta: The aortic root is normal in size and structure. Venous: The inferior vena cava is normal in size with greater than 50% respiratory variability, suggesting right atrial pressure of 3 mmHg. IAS/Shunts: No atrial level shunt detected by color flow Doppler.  LEFT VENTRICLE PLAX 2D LVIDd:         3.40 cm   Diastology LVIDs:         2.30 cm   LV e' medial:    7.36 cm/s LV PW:         1.40 cm   LV E/e' medial:  16.7 LV IVS:        1.50 cm   LV e' lateral:   9.11 cm/s LVOT diam:     2.00 cm   LV E/e' lateral: 13.5 LV SV:         41 LV SV Index:   24 LVOT Area:     3.14 cm  RIGHT VENTRICLE             IVC RV Basal diam:  3.30 cm     IVC diam: 1.50 cm RV S prime:     11.90 cm/s TAPSE (M-mode): 1.2 cm LEFT ATRIUM           Index        RIGHT ATRIUM           Index LA diam:      2.90 cm 1.69 cm/m   RA Area:     15.50 cm LA Vol (A2C): 30.4 ml 17.72 ml/m  RA Volume:   39.00 ml  22.73 ml/m LA Vol (A4C): 56.6 ml 32.99 ml/m  AORTIC VALVE AV Area (Vmax):    1.97 cm AV Area (Vmean):   1.61 cm AV Area (VTI):     1.77 cm AV Vmax:           112.00 cm/s AV Vmean:          89.700 cm/s AV VTI:            0.229 m AV Peak Grad:      5.0  mmHg AV Mean Grad:      3.0 mmHg LVOT Vmax:         70.30 cm/s LVOT Vmean:        46.000 cm/s LVOT VTI:          0.129 m LVOT/AV VTI ratio: 0.56  AORTA Ao Root diam: 2.70 cm Ao Asc diam:  2.90 cm MITRAL VALVE                TRICUSPID VALVE MV Area (PHT): 3.08 cm     TR Peak grad:   23.8 mmHg MV Area VTI:   1.05 cm     TR Vmax:        244.00 cm/s MV Peak grad:  7.1 mmHg MV Mean grad:  2.0 mmHg     SHUNTS MV Vmax:       1.33 m/s     Systemic VTI:  0.13 m MV Vmean:      59.5 cm/s    Systemic Diam: 2.00 cm MV Decel Time: 246 msec MV E velocity: 123.00 cm/s MV A velocity: 30.40 cm/s MV E/A ratio:  4.05 Shawna Bickers MD Electronically signed by Shawna Bickers MD Signature Date/Time: 06/29/2021/4:21:57 PM    Final     PHYSICAL EXAM General: Alert, well-developed female in no acute distress  Neurological:  Difficult to assess mental status as patient is mute and aphasic.  Patient cannot follow commands but will attempt to mimic actions . PERRL, EOMI.  Full strength and purposeful movement noted in all extremities.   Temp:  [97.5 F (36.4 C)-99.7 F (37.6 C)] 97.5 F (36.4 C) (12/26 1158) Pulse Rate:  [68-108] 96 (12/26 1200) Resp:  [13-26] 18 (12/26 1200) BP: (119-166)/(62-123) 155/78 (12/26 1200) SpO2:  [77 %-98 %] 94 % (12/26 1200)  General - Well nourished, well developed, in no apparent distress. Cardiovascular - Regular rhythm and rate.  Mental Status -  Patient is alert, able to mimic actions with prompting but does not follow commands consistently. Globally aphasic   Cranial Nerves II - XII - II - Visual field intact OU. III, IV, VI - Extraocular movements intact. V - Facial sensation intact bilaterally. VII - Right nasolabial fold flattening VIII - Hearing & vestibular intact bilaterally. X - Palate elevates symmetrically. XI - Chin turning & shoulder shrug intact bilaterally. XII - Tongue is midline  Motor Strength - The patients strength 4/5 in all extremities and  pronator drift was absent.  Bulk was normal and fasciculations were absent.   Motor Tone - Muscle tone was assessed at the neck and appendages and was normal. Reflexes - The patients reflexes were symmetrical in all extremities and she had no pathological reflexes. Sensory - Light touch, temperature/pinprick were assessed and were symmetrical.   Coordination - The patient had normal movements in the hands, FTN intact bilaterally. Tremor was absent. Gait and Station - deferred.   ASSESSMENT/PLAN Shawna Hopkins is a 87 y.o. female with history of HTN and GERD presenting with aphasia and right sided weakness.  She was unable to receive TNK as she presented outside the window, and hemorrhagic transformation of her infarct was noted on MRI.  She remains aphasic without weakness.  CT head reveals stable hemorrhage.  Stroke:  left MCA infarct with hemorrhagic conversion, embolic pattern likely due to new diagnosis of aflutter Code Stroke CT head Acute left MCA territory cortical/subcortical infarct with no evidence of hemorrhagic cnversion. Small vessel disease. Atrophy. ASPECTS 7 CTA head & neck No significant stenosis of vessels in the neck, Occlusion of distal M2, proximal M3 left MCA vessel CT perfusion 10 mL region of hypoperfused parenchyma with no core infarct identified MRI  Multiple acute cortical/subcortical infarcts in left MCA territory with hemorrhagic conversion, chornic lacunar basal ganglia infarcts, small chronic infarcts in cerebellar hemispheres  2D Echo EF 60 to 65% Repeat CT head No significant change in left middle MCA hemorrhage LDL 69 HgbA1c 6.0 VTE prophylaxis -Lovenox No antithrombotic prior to admission, now on ASA 81mg  given hemorrhagic transformation.  May consider DOAC once hemorrhage resolved on CT secondary to hemorraghic conversion of stroke Therapy recommendations: Acute inpatient rehab Disposition:  pending  A flutter New diagnosis Captured on EKG Rate  controlled Currently on aspirin 81 given hemorrhagic transformation.  Will consider DOAC once hemorrhage resolved on CT.  Hypertension Home meds:  none Stable Keep SBP goal <160 Long-term BP goal normotensive  Hyperlipidemia Home meds:  none LDL 69, goal < 70 High intensity statin not indicated due to advanced age and LDL below goal  Cerebral aneurysm CT head 6x42mm aneurysm projecting from distal cavernous/paraclinoid left ICA Given advanced age, will follow-up as outpatient  Will discuss with Dr. 11m   Other Stroke Risk Factors Advanced Age >/= 25   Other Active Problems Borderline enlarged right tracheobronchial mediastinal  lymph node - Consider CT chest as outpt Spiking fever - Tmax 100.4-> afebrile, UA WBC 6-10  Hospital day # 1  Patient seen and examined by NP/APP with MD. MD to update note as needed.   Janine Ores, DNP, FNP-BC [facility] Pager: [phone]  ATTENDING NOTE: I reviewed above note and agree with the assessment and plan. Pt was seen and examined.   Daughter and grandson are at the bedside. Pt reclining in chair. Still has global aphasia but able to mimic, not able to name or repeat. Moving all extremities. She was found to have aflutter on EKG. Now on ASA 81 due to HT. Will need AC later once hemorrhage resolved on CT.    Neuro - awake, alert, eyes open, global aphasia, not able to follow simple simple commands, but able to pantomime. Not able to name and repeat. No gaze palsy, tracking bilaterally, visual field full, PERRL. Mild right nasolabial fold flattening. Tongue midline. Bilateral UEs 4/5, no drift. Bilaterally LEs 4/5, no drift. Sensation and coordination not quite cooperative due to global aphasia, gait not tested.   For detailed assessment and plan, please refer to above as I have made changes wherever appropriate.   This patient is critically ill due to embolic stroke, new diagnosis of a flutter, 6 x 6 cerebral aneurysm  and at significant risk of neurological worsening, death form recurrent stroke, heart failure, aneurysm rupture. This patient's care requires constant monitoring of vital signs, hemodynamics, respiratory and cardiac monitoring, review of multiple databases, neurological assessment, discussion with family, other specialists and medical decision making of high complexity. I spent 40 minutes of neurocritical care time in the care of this patient. I had long discussion with daughter and grandson at bedside, updated pt current condition, treatment plan and potential prognosis, and answered all the questions.  They expressed understanding and appreciation.    Rosalin Hawking, MD PhD Stroke Neurology 06/30/2021 10:34 PM    To contact Stroke Continuity provider, please refer to [email or web address] After hours, contact General Neurology

## 2021-06-30 NOTE — Progress Notes (Signed)
Inpatient Rehab Admissions Coordinator:   Per therapy recommendations,  patient was screened for CIR candidacy by Megan Salon, MS, CCC-SLP . At this time, Pt. Appears to be a a potential candidate for CIR. I will place   order for rehab consult per protocol for full assessment. Note that Pt. Was previously living alone and now has significant physical and cognitive deficits that will preclude her from returning home alone after CIR, so family will need to be prepared to provide 24/7 assist following discharge from CIR in order to be considered.  Please contact me any with questions.  Megan Salon, MS, CCC-SLP Rehab Admissions Coordinator  [phone] (celll) [phone] (office)

## 2021-06-30 NOTE — Progress Notes (Signed)
°  Transition of Care [facility]) Screening Note   Patient Details  Name: Shawna Hopkins Date of Birth: Nov 13, 1927   [facility]) CM/SW Contact:    Baldemar Lenis, LCSW Phone Number: 06/30/2021, 3:17 PM    [facility]) has reviewed patient, pending CIR workup noted. We will continue to monitor patient advancement through interdisciplinary progression rounds. If new patient transition needs arise, please place a TOC consult.

## 2021-06-30 NOTE — Progress Notes (Signed)
Speech Language Pathology Treatment: Dysphagia;Cognitive-Linquistic  Patient Details Name: Shawna Hopkins MRN: [id number] DOB: 1928/06/26 Today's Date: 06/30/2021 Time: 0911-1000 SLP Time Calculation (min) (ACUTE ONLY): 49 min  Assessment / Plan / Recommendation Clinical Impression  Pt seen up in chair. Observed with sips of thin liquids. Pt hates water, very happy to get Sprite, but even with gesture and cues, could not encourage consecutive sips. Family reports pt always takes small sips. Today pt had a few small coughs throughout self feeding of straw sips of water and eating a whole bowl of frosted flakes with milk. Pt appears to have some sensory impairment with packing in her right buccal cavity but eventual clearance. Suspect perhaps occasional premature spillage with sensation, but low suspicion for significant dysphagia or aspiration risk. Will advance diet to mech soft and thin liquids and f/u for tolerance.   HPI HPI: 87 yo female who lives alone and has h/o HTN, GERD - was transferred from [facility] with global aphasia, R sided weakness,  Found to have left mca cva.  Pt also with h/o Cervical spine C3-C4 and C4-C5 ankylosis per prior imaging. Brain imaging also has shown "Mild chronic small vessel ischemic changes are also present within the pons. Chronic lacunar infarcts within the bilateral basal ganglia., Small chronic infarcts within the bilateral cerebellar hemispheres."      PT remains globally aphasic, family denies pt having issues swallowing from their recollection.      SLP Plan  Continue with current plan of care      Recommendations for follow up therapy are one component of a multi-disciplinary discharge planning process, led by the attending physician.  Recommendations may be updated based on patient status, additional functional criteria and insurance authorization.    Recommendations  Diet recommendations: Dysphagia 3 (mechanical soft);Thin liquid Liquids provided  via: Cup;Straw Medication Administration: Whole meds with puree Supervision: Patient able to self feed Compensations: Slow rate;Small sips/bites;Lingual sweep for clearance of pocketing Postural Changes and/or Swallow Maneuvers: Seated upright 90 degrees                Oral Care Recommendations: Oral care BID Follow Up Recommendations: Acute inpatient rehab (3hours/day) Assistance recommended at discharge: Frequent or constant Supervision/Assistance SLP Visit Diagnosis: Dysphagia, unspecified (R13.10) Plan: Continue with current plan of care         Harlon Ditty, MA CCC-SLP  Acute Rehabilitation Services Pager [phone] Office [phone]   Claudine Mouton  06/30/2021, 11:08 AM

## 2021-07-01 ENCOUNTER — Other Ambulatory Visit: Payer: Self-pay

## 2021-07-01 ENCOUNTER — Encounter (HOSPITAL_COMMUNITY): Payer: Self-pay | Admitting: Physical Medicine & Rehabilitation

## 2021-07-01 ENCOUNTER — Inpatient Hospital Stay (HOSPITAL_COMMUNITY)
Admission: RE | Admit: 2021-07-01 | Discharge: 2021-07-12 | DRG: 057 | Disposition: A | Discharge status: Home Health | Payer: Medicare Other | Source: Intra-hospital | Attending: Physical Medicine & Rehabilitation | Admitting: Physical Medicine & Rehabilitation

## 2021-07-01 DIAGNOSIS — I69320 Aphasia following cerebral infarction: Secondary | ICD-10-CM

## 2021-07-01 DIAGNOSIS — I671 Cerebral aneurysm, nonruptured: Secondary | ICD-10-CM

## 2021-07-01 DIAGNOSIS — I480 Paroxysmal atrial fibrillation: Secondary | ICD-10-CM

## 2021-07-01 DIAGNOSIS — I63512 Cerebral infarction due to unspecified occlusion or stenosis of left middle cerebral artery: Secondary | ICD-10-CM | POA: Diagnosis present

## 2021-07-01 DIAGNOSIS — N390 Urinary tract infection, site not specified: Secondary | ICD-10-CM | POA: Diagnosis present

## 2021-07-01 DIAGNOSIS — Z7982 Long term (current) use of aspirin: Secondary | ICD-10-CM

## 2021-07-01 DIAGNOSIS — I69392 Facial weakness following cerebral infarction: Secondary | ICD-10-CM | POA: Diagnosis not present

## 2021-07-01 DIAGNOSIS — K219 Gastro-esophageal reflux disease without esophagitis: Secondary | ICD-10-CM | POA: Diagnosis present

## 2021-07-01 DIAGNOSIS — G4700 Insomnia, unspecified: Secondary | ICD-10-CM | POA: Diagnosis present

## 2021-07-01 DIAGNOSIS — I4892 Unspecified atrial flutter: Secondary | ICD-10-CM | POA: Diagnosis present

## 2021-07-01 DIAGNOSIS — I4891 Unspecified atrial fibrillation: Secondary | ICD-10-CM | POA: Diagnosis present

## 2021-07-01 DIAGNOSIS — R7303 Prediabetes: Secondary | ICD-10-CM | POA: Diagnosis present

## 2021-07-01 DIAGNOSIS — F05 Delirium due to known physiological condition: Secondary | ICD-10-CM | POA: Diagnosis present

## 2021-07-01 DIAGNOSIS — I10 Essential (primary) hypertension: Secondary | ICD-10-CM | POA: Diagnosis present

## 2021-07-01 DIAGNOSIS — I69354 Hemiplegia and hemiparesis following cerebral infarction affecting left non-dominant side: Principal | ICD-10-CM

## 2021-07-01 DIAGNOSIS — F802 Mixed receptive-expressive language disorder: Secondary | ICD-10-CM | POA: Diagnosis present

## 2021-07-01 DIAGNOSIS — R531 Weakness: Secondary | ICD-10-CM | POA: Diagnosis present

## 2021-07-01 DIAGNOSIS — I619 Nontraumatic intracerebral hemorrhage, unspecified: Secondary | ICD-10-CM

## 2021-07-01 DIAGNOSIS — R509 Fever, unspecified: Secondary | ICD-10-CM

## 2021-07-01 LAB — CBC
HCT: 35.1 % — ABNORMAL LOW (ref 36.0–46.0)
Hemoglobin: 11.5 g/dL — ABNORMAL LOW (ref 12.0–15.0)
MCH: 29 pg (ref 26.0–34.0)
MCHC: 32.8 g/dL (ref 30.0–36.0)
MCV: 88.6 fL (ref 80.0–100.0)
Platelets: 202 10*3/uL (ref 150–400)
RBC: 3.96 MIL/uL (ref 3.87–5.11)
RDW: 11.9 % (ref 11.5–15.5)
WBC: 10 10*3/uL (ref 4.0–10.5)
nRBC: 0 % (ref 0.0–0.2)

## 2021-07-01 LAB — BASIC METABOLIC PANEL
Anion gap: 8 (ref 5–15)
BUN: 23 mg/dL (ref 8–23)
CO2: 27 mmol/L (ref 22–32)
Calcium: 8.7 mg/dL — ABNORMAL LOW (ref 8.9–10.3)
Chloride: 105 mmol/L (ref 98–111)
Creatinine, Ser: 1 mg/dL (ref 0.44–1.00)
GFR, Estimated: 53 mL/min — ABNORMAL LOW (ref >60)
Glucose, Bld: 130 mg/dL — ABNORMAL HIGH (ref 70–99)
Potassium: 3.4 mmol/L — ABNORMAL LOW (ref 3.5–5.1)
Sodium: 140 mmol/L (ref 135–145)

## 2021-07-01 MED ORDER — SENNOSIDES-DOCUSATE SODIUM 8.6-50 MG PO TABS
1.0000 | ORAL_TABLET | Freq: Two times a day (BID) | ORAL | Status: DC
  Administered 2021-07-01 – 2021-07-12 (×21): 1 via ORAL
  Filled 2021-07-01 (×22): qty 1

## 2021-07-01 MED ORDER — METOPROLOL TARTRATE 25 MG PO TABS: 25.0000 mg | ORAL_TABLET | Freq: Two times a day (BID) | ORAL | Status: DC

## 2021-07-01 MED ORDER — ENOXAPARIN SODIUM 30 MG/0.3ML IJ SOSY: 30.0000 mg | PREFILLED_SYRINGE | INTRAMUSCULAR | Status: DC

## 2021-07-01 MED ORDER — PANTOPRAZOLE SODIUM 40 MG PO TBEC
40.0000 mg | DELAYED_RELEASE_TABLET | Freq: Every day | ORAL | Status: DC
  Administered 2021-07-03 – 2021-07-12 (×10): 40 mg via ORAL
  Filled 2021-07-01 (×11): qty 1

## 2021-07-01 MED ORDER — ACETAMINOPHEN 650 MG RE SUPP: 650.0000 mg | RECTAL | Status: DC | PRN

## 2021-07-01 MED ORDER — ASPIRIN 81 MG PO TBEC: 81.0000 mg | DELAYED_RELEASE_TABLET | Freq: Every day | ORAL | 11 refills | Status: DC

## 2021-07-01 MED ORDER — ACETAMINOPHEN 325 MG PO TABS
650.0000 mg | ORAL_TABLET | ORAL | Status: DC | PRN
  Administered 2021-07-06 – 2021-07-12 (×3): 650 mg via ORAL
  Filled 2021-07-01 (×3): qty 2

## 2021-07-01 MED ORDER — ASPIRIN EC 81 MG PO TBEC
81.0000 mg | DELAYED_RELEASE_TABLET | Freq: Every day | ORAL | Status: DC
  Administered 2021-07-03 – 2021-07-11 (×9): 81 mg via ORAL
  Filled 2021-07-01 (×10): qty 1

## 2021-07-01 MED ORDER — ACETAMINOPHEN 160 MG/5ML PO SOLN: 650.0000 mg | ORAL | Status: DC | PRN

## 2021-07-01 MED ORDER — METOPROLOL TARTRATE 25 MG PO TABS
25.0000 mg | ORAL_TABLET | Freq: Two times a day (BID) | ORAL | Status: DC
  Administered 2021-07-01 – 2021-07-07 (×12): 25 mg via ORAL
  Filled 2021-07-01 (×12): qty 1

## 2021-07-01 MED ORDER — METOPROLOL TARTRATE 25 MG PO TABS
25.0000 mg | ORAL_TABLET | Freq: Two times a day (BID) | ORAL | Status: DC
  Administered 2021-07-01: 14:00:00 25 mg via ORAL
  Filled 2021-07-01: qty 1

## 2021-07-01 MED ORDER — ENOXAPARIN SODIUM 30 MG/0.3ML IJ SOSY
30.0000 mg | PREFILLED_SYRINGE | Freq: Every day | INTRAMUSCULAR | Status: DC
  Administered 2021-07-02 – 2021-07-11 (×10): 30 mg via SUBCUTANEOUS
  Filled 2021-07-01 (×10): qty 0.3

## 2021-07-01 NOTE — Progress Notes (Signed)
PMR Admission Coordinator Pre-Admission Assessment   Patient: Shawna Hopkins is an 87 y.o., female MRN: [id number] DOB: [birth year] Height: 5' 5"  (165.1 cm) Weight: 64.9 kg   Insurance Information HMO:     PPO:      PCP:      IPA:      80/20:      OTHER:  PRIMARY: Medicare       Policy#:  [id number]     Subscriber: Pt. Phone#: Verified online    Fax#:  Pre-Cert#:       Employer:  Benefits:  Phone #:      Name:  Eff. Date: Parts A ad B effective  Deduct: $1556      Out of Pocket Max:  None      Life Max: N/A  CIR: 100%      SNF: 100 days Outpatient: 80%     Co-Pay: 20% Home Health: 100%      Co-Pay: none DME: 80%     Co-Pay: 20% Providers: patient's choic SECONDARY: Medicaid [place] Access       Policy#: [id number]     Phone#:    Financial Counselor:       Phone#:    The Actuary for patients in Inpatient Rehabilitation Facilities with attached Privacy Act [place] Records was provided and verbally reviewed with: Family   Emergency Contact Information Contact Information       Name Relation Home Work Mobile    [place]     [phone]    Lamar Laundry Sister [phone]               Current Medical History  Patient Admitting Diagnosis: CVA History of Present Illness: Shawna Hopkins is a 87 year old right-handed female with history of hypertension as well as GERD.  Per chart review patient lives alone.  1 level home 2 steps to entry.  Daughter reports patient independent prior to admission.  Family does her grocery shopping and takes care providing transportation to any appointments.  Presented to [facility]/28/2021 with right side weakness and global aphasia.  CT/MRI showed multiple acute cortical subcortical left MCA territory infarcts within the left frontal parietal and temporal lobes.  The largest infarct within the mid to posterior left frontal lobe, left frontal operculum and left insula measuring 2.8 x 2.6 x  4.4 cm.  SWI signal loss present at site of dominant infarct compatible with petechial hemorrhage/hemorrhagic conversion.  Only mild mass-effect noted.  CT angiogram head and neck occlusion of distal M2/proximal M3 left MCA vessel.  Patient did not receive tPA.  EKG did show new onset atrial fibrillation with rate controlled.  She was transferred to [facility].  Echocardiogram with ejection fraction of 60 to 65% no wall motion abnormalities.  Follow-up cranial CT scan 06/29/2021 showed no significant change.  No new intracranial abnormality.  Incidental finding of 6 x 6 mm aneurysm projecting from distal cavernous/paraclinoid left ICA discussed with interventional radiology follow-up outpatient.  She was cleared to begin aspirin 81 mg daily considering DOAC once hemorrhage resolved with follow-up cranial CT.  She was cleared to begin Lovenox for DVT prophylaxis 06/30/2021.  Presently on a mechanical soft diet with thin liquids.  Therapy evaluations completed due to patient's aphasia as well as right-sided weakness was admitted for a comprehensive rehab program.   Complete NIHSS TOTAL: 7   Patient's medical record from [facility] has been reviewed by the rehabilitation admission coordinator and physician.  Past Medical History      Past Medical History:  Diagnosis Date   Hypertension        Has the patient had major surgery during 100 days prior to admission? No   Family History   family history is not on file.   Current Medications   Current Facility-Administered Medications:     stroke: mapping our early stages of recovery book, , Does not apply, Once, Donnetta Simpers, MD   acetaminophen (TYLENOL) tablet 650 mg, 650 mg, Oral, Q4H PRN **OR** acetaminophen (TYLENOL) 160 MG/5ML solution 650 mg, 650 mg, Per Tube, Q4H PRN **OR** acetaminophen (TYLENOL) suppository 650 mg, 650 mg, Rectal, Q4H PRN, Donnetta Simpers, MD, 650 mg at 06/29/21 0830   aspirin EC tablet 81  mg, 81 mg, Oral, Daily, Rosalin Hawking, MD, 81 mg at 06/30/21 1118   Chlorhexidine Gluconate Cloth 2 % PADS 6 each, 6 each, Topical, Daily, Donnetta Simpers, MD, 6 each at 06/30/21 1042   enoxaparin (LOVENOX) injection 30 mg, 30 mg, Subcutaneous, Q24H, Rosalin Hawking, MD, 30 mg at 06/30/21 1120   MEDLINE mouth rinse, 15 mL, Mouth Rinse, BID, Rosalin Hawking, MD, 15 mL at 06/30/21 2340   metoprolol tartrate (LOPRESSOR) injection 2.5 mg, 2.5 mg, Intravenous, Q6H PRN, Donnetta Simpers, MD   pantoprazole (PROTONIX) EC tablet 40 mg, 40 mg, Oral, Daily, Rosalin Hawking, MD, 40 mg at 06/30/21 1118   senna-docusate (Senokot-S) tablet 1 tablet, 1 tablet, Oral, BID, Donnetta Simpers, MD, 1 tablet at 06/30/21 1118   Patients Current Diet:  Diet Order                  DIET DYS 3 Room service appropriate? Yes; Fluid consistency: Thin  Diet effective now                         Precautions / Restrictions Precautions Precautions: Fall Precaution Comments: globally aphasic Restrictions Weight Bearing Restrictions: No    Has the patient had 2 or more falls or a fall with injury in the past year? Yes   Prior Activity Level Limited Community (1-2x/wk): Pt. went out about 1x a week   Prior Functional Level Self Care: Did the patient need help bathing, dressing, using the toilet or eating? Independent   Indoor Mobility: Did the patient need assistance with walking from room to room (with or without device)? Independent   Stairs: Did the patient need assistance with internal or external stairs (with or without device)? Needed some help   Functional Cognition: Did the patient need help planning regular tasks such as shopping or remembering to take medications? Needed some help   Patient Information Are you of Hispanic, Latino/a,or Spanish origin?: A. No, not of Hispanic, Latino/a, or Spanish origin What is your race?: A. White Do you need or want an interpreter to communicate with a doctor or health  care staff?: 0. No   Patient's Response To:  Health Literacy and Transportation Is the patient able to respond to health literacy and transportation needs?: No Health Literacy - How often do you need to have someone help you when you read instructions, pamphlets, or other written material from your doctor or pharmacy?: Patient unable to respond In the past 12 months, has lack of transportation kept you from medical appointments or from getting medications?: No In the past 12 months, has lack of transportation kept you from meetings, work, or from getting things needed for daily living?: No   Home Assistive Devices /  Equipment Home Equipment: Cane - single point, Conservation officer, nature (2 wheels)   Prior Device Use: Indicate devices/aids used by the patient prior to current illness, exacerbation or injury? Walker   Current Functional Level Cognition   Overall Cognitive Status: Difficult to assess Orientation Level: Disoriented X4 Following Commands: Follows one step commands with increased time, Follows one step commands inconsistently (benefits from demonstration, pt did recognize sock and with increased time put on socks at EOB) Safety/Judgement: Decreased awareness of safety, Decreased awareness of deficits General Comments: difficulty to assess due to global aphasia, pt answered "yes" to all questions including "What is your first name?" "i your name Manuela Schwartz?"    Extremity Assessment (includes Sensation/Coordination)   Upper Extremity Assessment: Generalized weakness RUE Deficits / Details: noted mild weakness but using functionally  Lower Extremity Assessment: Defer to PT evaluation RLE Deficits / Details: unable to accurately assess due to pt's global aphasia however noted difficulty clearing R foot during ambulation     ADLs   Overall ADL's : Needs assistance/impaired Eating/Feeding: Minimal assistance Grooming: Moderate assistance Upper Body Bathing: Minimal assistance Lower Body  Bathing: Moderate assistance Upper Body Dressing : Moderate assistance Lower Body Dressing: Moderate assistance, Sit to/from stand Toilet Transfer: Minimal assistance Functional mobility during ADLs: Minimal assistance     Mobility   Overal bed mobility: Needs Assistance Bed Mobility: Sit to Supine Supine to sit: Min guard Sit to supine: Min guard General bed mobility comments: pt brought self up to long sit in bed per family and was trying to get OOB to use bathroom     Transfers   Overall transfer level: Needs assistance Equipment used: 1 person hand held assist Transfers: Sit to/from Stand, Bed to chair/wheelchair/BSC Sit to Stand: Min assist Bed to/from chair/wheelchair/BSC transfer type:: Stand pivot Stand pivot transfers: Mod assist, +2 safety/equipment General transfer comment: max directional verbal and tactile cues to complete transfer to [facility] and then to chair     Ambulation / Gait / Stairs / Wheelchair Mobility   Ambulation/Gait Ambulation/Gait assistance: +2 physical assistance, Mod assist Gait Distance (Feet): 30 Feet (to the door and back) Assistive device: 2 person hand held assist Gait Pattern/deviations: Step-through pattern, Decreased stride length, Decreased dorsiflexion - right, Decreased weight shift to right General Gait Details: pt with decreased R LE step height and length, increased use of R UE to push down into PT compared to L UE per RN who reports she was just holding her hand but pt wasn't pushing down into it Gait velocity: slow Gait velocity interpretation: <1.8 ft/sec, indicate of risk for recurrent falls     Posture / Balance Dynamic Sitting Balance Sitting balance - Comments: pt able to don socks at EOB with min guard assist Balance Overall balance assessment: Needs assistance Sitting-balance support: Feet supported, No upper extremity supported Sitting balance-Leahy Scale: Fair Sitting balance - Comments: pt able to don socks at EOB with min  guard assist Standing balance support: Single extremity supported Standing balance-Leahy Scale: Poor Standing balance comment: requires external support     Special needs/care consideration Skin intact and Special service needs Pt. Globally aphasic    Previous Home Environment (from acute therapy documentation) Living Arrangements: Alone  Lives With: Alone Available Help at Discharge: Family, Available PRN/intermittently Type of Home: House Home Layout: One level Home Access: Stairs to enter [employer] of Steps: 2 Bathroom Shower/Tub: Chiropodist: Standard Bathroom Accessibility: Yes How Accessible: Accessible via walker, Accessible via wheelchair [place]: Yes  Discharge Living Setting Plans for Discharge Living Setting: Patient's home Type of Home at Discharge: House Discharge Home Layout: One level Discharge Home Access: Stairs to enter Entrance Stairs-Rails: None Entrance Stairs-Number of Steps: 1 Discharge Bathroom Shower/Tub: Tub/shower unit Discharge Bathroom Toilet: Standard Discharge Bathroom Accessibility: Yes How Accessible: Accessible via walker Does the patient have any problems obtaining your medications?: No   Social/Family/Support Systems Patient Roles: Other (Comment) Contact Information: [phone] Anticipated Caregiver: Kadija Cruzen Anticipated Caregiver's Contact Information: Pt.s son and daughter to rotate to provide care Ability/Limitations of Caregiver: can provide Mon A Caregiver Availability: 24/7 Discharge Plan Discussed with Primary Caregiver: Yes Is Caregiver In Agreement with Plan?: Yes Does Caregiver/Family have Issues with Lodging/Transportation while Pt is in Rehab?: No   Goals Patient/Family Goal for Rehab: PT/OT Min A, SLP Mod A Expected length of stay: 16-18 days Pt/Family Agrees to Admission and willing to participate: Yes Program Orientation Provided & Reviewed with Pt/Caregiver  Including Roles  & Responsibilities: Yes   Decrease burden of Care through IP rehab admission: Specialzed equipment needs, Diet advancement, Decrease number of caregivers, Bowel and bladder program, and Patient/family education   Possible need for SNF placement upon discharge: not anticipated   Patient Condition: I have reviewed medical records from [facility], spoken with CM, and patient. I met with patient at the bedside for inpatient rehabilitation assessment.  Patient will benefit from ongoing PT, OT, and SLP, can actively participate in 3 hours of therapy a day 5 days of the week, and can make measurable gains during the admission.  Patient will also benefit from the coordinated team approach during an Inpatient Acute Rehabilitation admission.  The patient will receive intensive therapy as well as Rehabilitation physician, nursing, social worker, and care management interventions.  Due to bladder management, bowel management, safety, skin/wound care, disease management, medication administration, pain management, and patient education the patient requires 24 hour a day rehabilitation nursing.  The patient is currently mod+2-max A with mobility and basic ADLs.  Discharge setting and therapy post discharge at home with home health is anticipated.  Patient has agreed to participate in the Acute Inpatient Rehabilitation Program and will admit today.   Preadmission Screen Completed By:  Genella Mech, 07/01/2021 9:22 AM ______________________________________________________________________   Discussed status with Dr. Dagoberto Ligas on 07/01/21 at 83 and received approval for admission today.   Admission Coordinator:  Genella Mech, CCC-SLP, time 3536 /Date 07/01/21    Assessment/Plan: Diagnosis: Does the need for close, 24 hr/day Medical supervision in concert with the patient's rehab needs make it unreasonable for this patient to be served in a less intensive setting?  Yes Co-Morbidities requiring supervision/potential complications: global aphasia, dysphagia; R hemiparesis; new aflutter; prediabetic;  Due to bladder management, bowel management, safety, skin/wound care, disease management, medication administration, pain management, and patient education, does the patient require 24 hr/day rehab nursing? Yes Does the patient require coordinated care of a physician, rehab nurse, PT, OT, and SLP to address physical and functional deficits in the context of the above medical diagnosis(es)? Yes Addressing deficits in the following areas: balance, endurance, locomotion, strength, transferring, bowel/bladder control, bathing, dressing, feeding, grooming, toileting, cognition, speech, language, and swallowing Can the patient actively participate in an intensive therapy program of at least 3 hrs of therapy 5 days a week? Yes The potential for patient to make measurable gains while on inpatient rehab is good and fair Anticipated functional outcomes upon discharge from inpatient rehab: min assist PT, min assist OT,  mod assist SLP Estimated rehab length of stay to reach the above functional goals is: 16-18 days Anticipated discharge destination: Home 10. Overall Rehab/Functional Prognosis: good     MD Signature:

## 2021-07-01 NOTE — Progress Notes (Signed)
Patient arrived on unit, oriented to unit. Reviewed medications, therapy schedule, rehab routine and plan of care. States an understanding of information reviewed. No complications noted at this time. Patient reports no pain Shawna Hopkins  

## 2021-07-01 NOTE — Discharge Summary (Addendum)
Stroke Discharge Summary  Patient ID: Shawna Hopkins   MRN: [id number]      DOB: 08/07/1927  Date of Admission: 06/29/2021 Date of Discharge: 07/01/2021  Attending Physician:  Stroke, Md, MD, Stroke MD Consultant(s):    Interventional Radiology Patient's PCP:  [facility]  Discharge Diagnoses:  left MCA infarct with hemorrhagic conversion, embolic pattern likely due to new diagnosis of aflutter Principal Problem:   left MCA infarct with hemorrhagic conversion, embolic pattern likely due to new diagnosis of afib/aflutter Active Problems:   Distal left ICA 6 mm aneurysm   Aflutter / Afib   HTN   HLD     Medications to be continued on Rehab Allergies as of 07/01/2021   No Known Allergies      Medication List     TAKE these medications    aspirin 81 MG EC tablet Take 1 tablet (81 mg total) by mouth daily. Swallow whole. Start taking on: July 02, 2021   metoprolol tartrate 25 MG tablet Commonly known as: LOPRESSOR Take 1 tablet (25 mg total) by mouth 2 (two) times daily.        LABORATORY STUDIES CBC    Component Value Date/Time   WBC 10.0 07/01/2021 0244   RBC 3.96 07/01/2021 0244   HGB 11.5 (L) 07/01/2021 0244   HCT 35.1 (L) 07/01/2021 0244   PLT 202 07/01/2021 0244   MCV 88.6 07/01/2021 0244   MCH 29.0 07/01/2021 0244   MCHC 32.8 07/01/2021 0244   RDW 11.9 07/01/2021 0244   LYMPHSABS 0.5 (L) 06/28/2021 1552   MONOABS 0.4 06/28/2021 1552   EOSABS 0.0 06/28/2021 1552   BASOSABS 0.0 06/28/2021 1552   CMP    Component Value Date/Time   NA 140 07/01/2021 0244   K 3.4 (L) 07/01/2021 0244   CL 105 07/01/2021 0244   CO2 27 07/01/2021 0244   GLUCOSE 130 (H) 07/01/2021 0244   BUN 23 07/01/2021 0244   CREATININE 1.00 07/01/2021 0244   CALCIUM 8.7 (L) 07/01/2021 0244   PROT 6.8 06/28/2021 1552   ALBUMIN 3.7 06/28/2021 1552   AST 23 06/28/2021 1552   ALT 13 06/28/2021 1552   ALKPHOS 59 06/28/2021 1552   BILITOT 0.9  06/28/2021 1552   GFRNONAA 53 (L) 07/01/2021 0244   GFRAA >60 02/29/2020 2000   COAGS Lab Results  Component Value Date   INR 1.1 06/28/2021   Lipid Panel    Component Value Date/Time   CHOL 134 06/29/2021 0454   TRIG 52 06/29/2021 0454   HDL 55 06/29/2021 0454   CHOLHDL 2.4 06/29/2021 0454   VLDL 10 06/29/2021 0454   LDLCALC 69 06/29/2021 0454   HgbA1C  Lab Results  Component Value Date   HGBA1C 6.0 (H) 06/29/2021   Urinalysis    Component Value Date/Time   COLORURINE STRAW (A) 06/28/2021 1552   APPEARANCEUR CLEAR 06/28/2021 1552   LABSPEC 1.015 06/28/2021 1552   PHURINE 7.0 06/28/2021 1552   GLUCOSEU NEGATIVE 06/28/2021 1552   HGBUR MODERATE (A) 06/28/2021 1552   BILIRUBINUR NEGATIVE 06/28/2021 1552   KETONESUR 40 (A) 06/28/2021 1552   PROTEINUR 30 (A) 06/28/2021 1552   NITRITE NEGATIVE 06/28/2021 1552   LEUKOCYTESUR SMALL (A) 06/28/2021 1552   Urine Drug Screen     Component Value Date/Time   LABOPIA NONE DETECTED 06/28/2021 1552   COCAINSCRNUR NONE DETECTED 06/28/2021 1552   LABBENZ NONE DETECTED 06/28/2021 1552   AMPHETMU NONE DETECTED 06/28/2021 1552  THCU NONE DETECTED 06/28/2021 1552   LABBARB NONE DETECTED 06/28/2021 1552    Alcohol Level    Component Value Date/Time   ETH <10 06/28/2021 1552     SIGNIFICANT DIAGNOSTIC STUDIES CT HEAD WO CONTRAST (5MM)  Result Date: 06/29/2021 CLINICAL DATA:  87 year old female with left MCA M2/proximal M3 occlusion and MCA territory infarct. Left ICA 6 mm aneurysm. EXAM: CT HEAD WITHOUT CONTRAST TECHNIQUE: Contiguous axial images were obtained from the base of the skull through the vertex without intravenous contrast. COMPARISON:  Brain MRI, head CT 0251 hours today. CTA head and neck 06/28/2021. FINDINGS: Brain: No significant change an petechial hemorrhage in the left middle MCA territory, now most conspicuous on series 2, image 17. Regional cytotoxic edema corresponding to abnormal diffusion. No significant  midline shift or intracranial mass effect. Stable gray-white matter differentiation elsewhere. No extra-axial hemorrhage. Vascular: Calcified atherosclerosis at the skull base. Distal left ICA 6 mm aneurysm occult by plain CT. Skull: No acute osseous abnormality identified. Sinuses/Orbits: Visualized paranasal sinuses and mastoids are clear. Other: No acute orbit or scalp soft tissue finding. IMPRESSION: 1. No significant change since 0251 hours in the left middle MCA territory petechial hemorrhage and cytotoxic edema. No significant mass effect. 2. No new intracranial abnormality. Distal left ICA 6 mm aneurysm occult by plain CT. Electronically Signed   By: Genevie Ann M.D.   On: 06/29/2021 10:35   CT HEAD WO CONTRAST  Result Date: 06/29/2021 CLINICAL DATA:  Stroke follow-up EXAM: CT HEAD WITHOUT CONTRAST TECHNIQUE: Contiguous axial images were obtained from the base of the skull through the vertex without intravenous contrast. COMPARISON:  06/28/2021 FINDINGS: Brain: There is small volume petechial hemorrhage of the left frontal lobe. No mass effect. There is generalized atrophy without lobar predilection. Hypodensity of the white matter is most commonly associated with chronic microvascular disease. Vascular: No abnormal hyperdensity of the major intracranial arteries or dural venous sinuses. No intracranial atherosclerosis. Skull: The visualized skull base, calvarium and extracranial soft tissues are normal. Sinuses/Orbits: No fluid levels or advanced mucosal thickening of the visualized paranasal sinuses. No mastoid or middle ear effusion. The orbits are normal. IMPRESSION: 1. Small volume petechial hemorrhage of the left frontal lobe without mass effect. 2. Hypodensity of the white matter is most commonly associated with chronic microvascular disease. Electronically Signed   By: Ulyses Jarred M.D.   On: 06/29/2021 03:07   MR BRAIN WO CONTRAST  Result Date: 06/28/2021 CLINICAL DATA:  Neuro deficit, acute,  stroke suspected. EXAM: MRI HEAD WITHOUT CONTRAST TECHNIQUE: Multiplanar, multiecho pulse sequences of the brain and surrounding structures were obtained without intravenous contrast. COMPARISON:  Noncontrast head CT and CT angiogram head/neck 06/28/2021. FINDINGS: Brain: Mild generalized cerebral and cerebellar atrophy. Multiple acute cortical/subcortical infarcts within the mid-to-posterior left frontal lobe, left frontal operculum and left insula. The largest infarct measures 2.8 x 2.6 cm x 4.4 cm. There is gyriform SWI signal loss associated with this largest infarct compatible with petechial hemorrhage/hemorrhagic conversion. There is only mild local mass effect. A few small acute cortically-based infarcts are also present within the left parietal and temporal lobes. Background advanced patchy and confluent T2 FLAIR hyperintense signal abnormality within the cerebral white matter, nonspecific but compatible with chronic small vessel ischemic disease. Minimal chronic small-vessel ischemic changes are also present within the pons. Chronic infarcts within the bilateral basal ganglia. Punctate chronic microhemorrhages within the medial left parietooccipital lobes and left thalamus. Small chronic infarcts within the bilateral cerebellar hemispheres. No evidence of an  intracranial mass. No extra-axial fluid collection. No midline shift. Vascular: Curvilinear SWI signal loss at the posterior aspect of the left sylvian fissure, likely reflecting endoluminal thrombus within a distal M2/proximal M3 left MCA vessel, as described on the CTA performed earlier today. Flow voids otherwise preserved within the proximal large arterial vessels. Skull and upper cervical spine: No focal suspicious marrow lesion. Incompletely assessed cervical spondylosis. Sinuses/Orbits: Visualized orbits show no acute finding. Right lens replacement. Mild mucosal thickening within the bilateral ethmoid sinuses. Impression #1 was called by  telephone at the time of interpretation on 06/28/2021 at 8:14 pm to provider Dr. Derry Lory, who verbally acknowledged these results. IMPRESSION: Multiple acute cortical/subcortical left MCA territory infarcts within the left frontal, parietal and temporal lobes. The largest infarct within the mid-to-posterior left frontal lobe, left frontal operculum and left insula measures 2.8 x 2.6 x 4.4 cm. SWI signal loss is present at site of this dominant infarct, compatible with petechial hemorrhage/hemorrhagic conversion. There is only mild local mass effect. Background advanced chronic small vessel changes within the cerebral white matter. Mild chronic small vessel ischemic changes are also present within the pons. Chronic lacunar infarcts within the bilateral basal ganglia. Small chronic infarcts within the bilateral cerebellar hemispheres. Mild generalized parenchymal atrophy. Electronically Signed   By: Jackey Loge D.O.   On: 06/28/2021 20:18   DG CHEST PORT 1 VIEW  Result Date: 06/30/2021 CLINICAL DATA:  Fever, stroke EXAM: PORTABLE CHEST 1 VIEW COMPARISON:  06/28/2021 FINDINGS: The heart size and mediastinal contours are within normal limits. Both lungs are clear. The visualized skeletal structures are unremarkable. IMPRESSION: No acute abnormality of the lungs in AP portable projection. Electronically Signed   By: Jearld Lesch M.D.   On: 06/30/2021 10:01   DG Chest Portable 1 View  Result Date: 06/28/2021 CLINICAL DATA:  New onset atrial fibrillation.  Acute stroke. EXAM: PORTABLE CHEST 1 VIEW COMPARISON:  02/29/2020 FINDINGS: Heart size upper limits of normal. Calcification and tortuosity of the aorta. Mildly prominent interstitial markings in the mid and lower lungs, improved since the study of February 29, 2020 where there was quite likely acute pneumonia. Today's markings could be chronic or indicate mild active inflammatory disease. No consolidation or collapse. No effusion. Bony structures  unremarkable. IMPRESSION: No consolidation, collapse, frank edema or effusion. Slightly prominent markings at the lung bases which may be residua of previous pneumonia. Electronically Signed   By: Paulina Fusi M.D.   On: 06/28/2021 16:28   ECHOCARDIOGRAM COMPLETE  Result Date: 06/29/2021    ECHOCARDIOGRAM REPORT   Patient Name:   Shawna Hopkins Date of Exam: 06/29/2021 Medical Rec #:  [id number]         Height:       65.0 in Accession #:    [id number]        Weight:       143.1 lb Date of Birth:  Apr 09, 1928         BSA:          1.716 m Patient Age:    89 years          BP:           129/64 mmHg Patient Gender: F                 HR:           77 bpm. Exam Location:  Inpatient Procedure: 2D Echo, Cardiac Doppler and Color Doppler Indications:    Stroke  History:  Patient has prior history of Echocardiogram examinations. Risk                 Factors:Hypertension.  Sonographer:    Clayton Lefort RDCS (AE) Referring Phys: [id number] [place]  1. Left ventricular ejection fraction, by estimation, is 60 to 65%. The left ventricle has normal function. The left ventricle has no regional wall motion abnormalities. There is moderate concentric left ventricular hypertrophy. Left ventricular diastolic function could not be evaluated.  2. Right ventricular systolic function is normal. The right ventricular size is normal. There is normal pulmonary artery systolic pressure.  3. Left atrial size was moderately dilated.  4. Right atrial size was moderately dilated.  5. The mitral valve is normal in structure. Trivial mitral valve regurgitation. No evidence of mitral stenosis.  6. Tricuspid valve regurgitation is mild to moderate.  7. The aortic valve is tricuspid. There is mild calcification of the aortic valve. Aortic valve regurgitation is not visualized. Aortic valve sclerosis/calcification is present, without any evidence of aortic stenosis.  8. The inferior vena cava is normal in size with greater  than 50% respiratory variability, suggesting right atrial pressure of 3 mmHg. FINDINGS  Left Ventricle: Left ventricular ejection fraction, by estimation, is 60 to 65%. The left ventricle has normal function. The left ventricle has no regional wall motion abnormalities. The left ventricular internal cavity size was normal in size. There is  moderate concentric left ventricular hypertrophy. Left ventricular diastolic function could not be evaluated due to atrial fibrillation. Left ventricular diastolic function could not be evaluated. Right Ventricle: The right ventricular size is normal. No increase in right ventricular wall thickness. Right ventricular systolic function is normal. There is normal pulmonary artery systolic pressure. The tricuspid regurgitant velocity is 2.44 m/s, and  with an assumed right atrial pressure of 8 mmHg, the estimated right ventricular systolic pressure is [id number] mmHg. Left Atrium: Left atrial size was moderately dilated. Right Atrium: Right atrial size was moderately dilated. Pericardium: There is no evidence of pericardial effusion. Mitral Valve: The mitral valve is normal in structure. There is mild calcification of the mitral valve leaflet(s). Trivial mitral valve regurgitation. No evidence of mitral valve stenosis. MV peak gradient, 7.1 mmHg. The mean mitral valve gradient is 2.0  mmHg. Tricuspid Valve: The tricuspid valve is normal in structure. Tricuspid valve regurgitation is mild to moderate. No evidence of tricuspid stenosis. Aortic Valve: The aortic valve is tricuspid. There is mild calcification of the aortic valve. Aortic valve regurgitation is not visualized. Aortic valve sclerosis/calcification is present, without any evidence of aortic stenosis. Aortic valve mean gradient measures 3.0 mmHg. Aortic valve peak gradient measures 5.0 mmHg. Aortic valve area, by VTI measures 1.77 cm. Pulmonic Valve: The pulmonic valve was not well visualized. Pulmonic valve regurgitation is not  visualized. No evidence of pulmonic stenosis. Aorta: The aortic root is normal in size and structure. Venous: The inferior vena cava is normal in size with greater than 50% respiratory variability, suggesting right atrial pressure of 3 mmHg. IAS/Shunts: No atrial level shunt detected by color flow Doppler.  LEFT VENTRICLE PLAX 2D LVIDd:         3.40 cm   Diastology LVIDs:         2.30 cm   LV e' medial:    7.36 cm/s LV PW:         1.40 cm   LV E/e' medial:  16.7 LV IVS:        1.50 cm  LV e' lateral:   9.11 cm/s LVOT diam:     2.00 cm   LV E/e' lateral: 13.5 LV SV:         41 LV SV Index:   24 LVOT Area:     3.14 cm  RIGHT VENTRICLE             IVC RV Basal diam:  3.30 cm     IVC diam: 1.50 cm RV S prime:     11.90 cm/s TAPSE (M-mode): 1.2 cm LEFT ATRIUM           Index        RIGHT ATRIUM           Index LA diam:      2.90 cm 1.69 cm/m   RA Area:     15.50 cm LA Vol (A2C): 30.4 ml 17.72 ml/m  RA Volume:   39.00 ml  22.73 ml/m LA Vol (A4C): 56.6 ml 32.99 ml/m  AORTIC VALVE AV Area (Vmax):    1.97 cm AV Area (Vmean):   1.61 cm AV Area (VTI):     1.77 cm AV Vmax:           112.00 cm/s AV Vmean:          89.700 cm/s AV VTI:            0.229 m AV Peak Grad:      5.0 mmHg AV Mean Grad:      3.0 mmHg LVOT Vmax:         70.30 cm/s LVOT Vmean:        46.000 cm/s LVOT VTI:          0.129 m LVOT/AV VTI ratio: 0.56  AORTA Ao Root diam: 2.70 cm Ao Asc diam:  2.90 cm MITRAL VALVE                TRICUSPID VALVE MV Area (PHT): 3.08 cm     TR Peak grad:   23.8 mmHg MV Area VTI:   1.05 cm     TR Vmax:        244.00 cm/s MV Peak grad:  7.1 mmHg MV Mean grad:  2.0 mmHg     SHUNTS MV Vmax:       1.33 m/s     Systemic VTI:  0.13 m MV Vmean:      59.5 cm/s    Systemic Diam: 2.00 cm MV Decel Time: 246 msec MV E velocity: 123.00 cm/s MV A velocity: 30.40 cm/s MV E/A ratio:  4.05 Glori Bickers MD Electronically signed by Glori Bickers MD Signature Date/Time: 06/29/2021/4:21:57 PM    Final    CT HEAD CODE STROKE WO  CONTRAST  Result Date: 06/28/2021 CLINICAL DATA:  Code stroke. Provided history: Neuro deficit, acute, stroke suspected; right-sided weakness. Additional history provided: Aphasia, right-sided weakness. EXAM: CT HEAD WITHOUT CONTRAST TECHNIQUE: Contiguous axial images were obtained from the base of the skull through the vertex without intravenous contrast. COMPARISON:  No pertinent prior exams available for comparison. FINDINGS: Brain: Mild generalized cerebral and cerebellar atrophy. Abnormal cortical/subcortical hypodensity with loss of gray-white differentiation within portions of the left frontal operculum and left insula, compatible with acute left MCA territory infarct. No significant mass effect at this time. No evidence of hemorrhagic conversion. Background advanced patchy and confluent hypoattenuation within the cerebral white matter, nonspecific but compatible with chronic small vessel ischemic disease. Small chronic infarcts within the basal ganglia and likely within the left cerebellar hemisphere.  No extra-axial fluid collection. No evidence of an intracranial mass. No midline shift. Vascular: Asymmetric hyperdensity of a proximal M2 left MCA vessel suspicious for endoluminal thrombus at this site (for instance as seen on series 3, image 13). Atherosclerotic calcifications. Skull: Normal. Negative for fracture or focal lesion. Sinuses/Orbits: Leftward gaze. Trace mucosal thickening within the bilateral ethmoid air cells. ASPECTS [facility] Stroke Program Early CT Score) - Ganglionic level infarction (caudate, lentiform nuclei, internal capsule, insula, M1-M3 cortex): 5 - Supraganglionic infarction (M4-M6 cortex): 2 Total score (0-10 with 10 being normal): 7 These results were called by telephone at the time of interpretation on 06/28/2021 at 3:38 pm to provider Dr. Selina Cooley, who verbally acknowledged these results. IMPRESSION: Acute left MCA territory cortical/subcortical infarct within the left frontal  operculum and left insula. ASPECTS is 7. No evidence of hemorrhagic conversion. No significant mass effect at this time. Background advanced chronic small vessel ischemic changes within the cerebral white matter. Chronic infarcts within the bilateral basal ganglia and likely left cerebellar hemisphere. Mild generalized parenchymal atrophy. Electronically Signed   By: Jackey Loge D.O.   On: 06/28/2021 15:41   CT ANGIO HEAD NECK W WO CM W PERF (CODE STROKE)  Result Date: 06/28/2021 CLINICAL DATA:  Neuro deficit, acute, stroke suspected. Additional history provided: Aphasia, right-sided weakness. EXAM: CT ANGIOGRAPHY HEAD AND NECK CT PERFUSION BRAIN TECHNIQUE: Multidetector CT imaging of the head and neck was performed using the standard protocol during bolus administration of intravenous contrast. Multiplanar CT image reconstructions and MIPs were obtained to evaluate the vascular anatomy. Carotid stenosis measurements (when applicable) are obtained utilizing NASCET criteria, using the distal internal carotid diameter as the denominator. Multiphase CT imaging of the brain was performed following IV bolus contrast injection. Subsequent parametric perfusion maps were calculated using RAPID software. CONTRAST:  OMNIPAQUE IOHEXOL 350 MG/ML SOLN COMPARISON:  Noncontrast head CT performed earlier today 06/28/2021. FINDINGS: CTA NECK FINDINGS Aortic arch: Common origin of the innominate and left common carotid arteries. Atherosclerotic plaque within the visualized aortic arch and proximal major branch vessels of the neck. No hemodynamically significant innominate or proximal subclavian artery stenosis. Right carotid system: CCA and ICA patent within the neck without stenosis or significant atherosclerotic disease. Tortuosity of the cervical ICA. Left carotid system: CCA and ICA patent within the neck without hemodynamically significant stenosis (50% or greater). Minimal atherosclerotic plaque at the CCA origin  and within the proximal ICA. Tortuosity of the cervical ICA. Vertebral arteries: Vertebral arteries codominant and patent within the neck. Mild nonstenotic atherosclerotic plaque at the origin of the right vertebral artery. Skeleton: Trace C2-C3 grade 1 retrolisthesis. Trace C7-T1 grade 1 anterolisthesis. Cervical spondylosis. C3-C4 and C4-C5 ankylosis. No acute bony abnormality or aggressive osseous lesion. Other neck: Subcentimeter nodules within the right thyroid lobe, not meeting consensus criteria for ultrasound follow-up based on size. No cervical lymphadenopathy. Upper chest: No consolidation within the imaged lung apices. Borderline enlarged right tracheobronchial lymph node measuring 11 mm in short axis (series 9, image 2). Review of the MIP images confirms the above findings CTA HEAD FINDINGS Anterior circulation: The intracranial internal carotid arteries are patent. Nonstenotic atherosclerotic plaque within both vessels. 6 x 6 mm inferomedially projecting saccular aneurysm arising from the distal cavernous/paraclinoid left ICA (for instance as seen on series 9, image 248) (series 12, image 116). Separate 2 mm inferiorly projecting vascular protrusion arising from the paraclinoid left ICA more laterally, which may reflect an infundibulum or additional aneurysm (for instance as seen on series 9,  image 251) (series 12, image 120). The M1 middle cerebral arteries are patent. No right M2 proximal branch occlusion or high-grade proximal stenosis is identified. There is occlusion of a distal M2/proximal M3 left MCA vessel (series 14, image 31) (series 10, image 56). The anterior cerebral arteries are patent. Posterior circulation: The intracranial vertebral arteries are patent. The basilar artery is patent. The posterior cerebral arteries are patent. A right posterior communicating artery is present. The left posterior communicating artery is diminutive or absent. Venous sinuses: Within the limitations of  contrast timing, no convincing thrombus. Anatomic variants: As described. Review of the MIP images confirms the above findings CT Brain Perfusion Findings: CBF (<30%) Volume: 66mL Perfusion (Tmax>6.0s) volume: 71mL (within the posterior left MCA vascular territory). Mismatch Volume: 44mL Infarction Location:None identified Emergent findings discussed with Dr. Quinn Axe at 4 p.m. on 06/28/2021 by telephone. IMPRESSION: CTA neck: 1. The common carotid, internal carotid and vertebral arteries are patent within the neck without significant stenosis. Mild atherosclerotic disease within the major arterial vessels of the neck, as described. 2.  Aortic Atherosclerosis (ICD10-I70.0). 3. Partially imaged borderline enlarged right tracheobronchial mediastinal lymph node, nonspecific. Non-emergent dedicated CT imaging of the chest may be obtained for further evaluation, as clinically warranted. CTA head: 1. Occlusion of a distal M2/proximal M3 left MCA vessel. 2. Non-stenotic atherosclerotic plaque within the intracranial internal carotid arteries, bilaterally. 3. 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 4. Adjacent 2 mm aneurysm versus infundibulum arising from the paraclinoid left ICA. CT perfusion head: The perfusion software identifies a 10 mL region of critically hypoperfused parenchyma within the posterior left MCA vascular territory ( Tmax>6 seconds threshold). The perfusion software identifies no core infarct. However, there are clear changes of acute infarction on the concurrently performed noncontrast head CT. The perfusion software reports a mismatch volume of 10 mL. Electronically Signed   By: Kellie Simmering D.O.   On: 06/28/2021 16:14       HISTORY OF PRESENT ILLNESS Ms. URIJAH ROSO is a 87 y.o. female with history of HTN and GERD presenting with aphasia and right sided weakness.  She was unable to receive TNK as she presented outside the window, and  hemorrhagic transformation of her infarct was noted on MRI.  She remains aphasic without weakness.  CT head reveals stable hemorrhage. She was found to have aflutter on EKG. Now on ASA 81 due to HT. Will need AC later once hemorrhage resolved on CT. Start metoprolol 25mg  BID for heart rate, EKG ordered to try to catch atrial fibrillation on an EKG. Discharge to inpatient rehab, then follow up with interventional radiology and neurology outpatient. Plan for a CT prior to discharge from inpatient rehab and then start DAPT therapy.   HOSPITAL COURSE Stroke:  left MCA infarct with hemorrhagic conversion, embolic pattern likely due to new diagnosis of afib/aflutter Code Stroke CT head Acute left MCA territory cortical/subcortical infarct with no evidence of hemorrhagic cnversion. Small vessel disease. Atrophy. 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  May consider DOAC once hemorrhage resolved on CT secondary to hemorraghic conversion of stroke Therapy recommendations: Acute inpatient rehab Disposition:  pending   A flutter Afib with RVR New diagnosis Captured on EKG Put on metoprolol 25 bid Currently on aspirin 81 given hemorrhagic transformation.  Will consider DOAC once hemorrhage resolved on CT.   Hypertension Home meds:  none Stable Keep SBP goal <160 Long-term BP goal normotensive   Hyperlipidemia Home meds:   none LDL 69, goal < 70 High intensity statin not indicated due to advanced age and LDL below goal   Cerebral aneurysm CT head 6x73mm aneurysm projecting from distal cavernous/paraclinoid left ICA Given advanced age, will follow-up as outpatient  Discussed with Dr. Estanislado Pandy, he agrees with outpt follow up   Other Stroke Risk Factors Advanced Age >/= 89    Other Active Problems Borderline enlarged right tracheobronchial mediastinal lymph node - Consider CT chest as outpt Spiking fever - Tmax 100.4-> afebrile, UA WBC 6-10  DISCHARGE EXAM Blood pressure (!) 175/93, pulse 98, temperature 98.2 F (36.8 C), temperature source Oral, resp. rate 17, height 5\' 5"  (1.651 m), weight 64.9 kg, SpO2 96 %. General - Well nourished, well developed, in no apparent distress. Cardiovascular - Regular rhythm and rate.   Mental Status -  Patient is alert, able to mimic actions with prompting but does not follow commands consistently. Globally aphasic    Cranial Nerves II - XII - II - Visual field intact OU. III, IV, VI - Extraocular movements intact. V - Facial sensation intact bilaterally. VII - Right nasolabial fold flattening VIII - Hearing & vestibular intact bilaterally. X - Palate elevates symmetrically. XI - Chin turning & shoulder shrug intact bilaterally. XII - Tongue is midline   Motor Strength - The patients strength 4/5 in all extremities and pronator drift was absent.  Bulk was normal and fasciculations were absent.   Motor Tone - Muscle tone was assessed at the neck and appendages and was normal. Reflexes - The patients reflexes were symmetrical in all extremities and she had no pathological reflexes. Sensory - Light touch, temperature/pinprick were assessed and were symmetrical.   Coordination - The patient had normal movements in the hands, FTN intact bilaterally. Tremor was absent. Gait and Station - deferred.  DISCHARGE PLAN Disposition:  Transfer to [facility] for ongoing PT, OT and ST aspirin 81 mg daily for secondary stroke prevention until the hemorrhage is reabsorbed on repeat CT then DAPT after discharge.  Recommend ongoing stroke risk factor control by Primary Care Physician at time of discharge from inpatient rehabilitation. Follow-up PCP Center, [place] in 2 weeks following discharge from rehab. Follow-up in [facility] in 4 weeks following discharge from rehab, office to schedule an appointment.  Ambulatory referral to Interventional Radiology with Dr. Estanislado Pandy after discharge from rehab.   40 minutes were spent preparing discharge.  Patient seen and examined by NP/APP with MD. MD to update note as needed.   Janine Ores, DNP, FNP-BC [facility] Pager: [phone]  ATTENDING NOTE: I reviewed above note and agree with the assessment and plan. Pt was seen and examined.   No family at the bedside.  Patient lying in bed, still has global aphasia but moving all extremities.  Telemetry monitoring showed A. fib RVR which confirmed on EKG.  Start metoprolol 25 twice daily.  Continue aspirin, once HT resolved on  CT, may consider DOAC for further stroke prevention.  Patient ready for discharge to CIR.  For detailed assessment and plan, please refer to above as I have made changes wherever appropriate.   Rosalin Hawking, MD PhD Stroke Neurology 07/01/2021 6:53 PM

## 2021-07-01 NOTE — H&P (Signed)
Physical Medicine and Rehabilitation Admission H&P   CC: global aphasia/code stroke  HPI: Shawna Hopkins is a 87 year old right-handed female with history of hypertension as well as GERD.  Per chart review patient lives alone.  1 level home 2 steps to entry.  Daughter reports patient independent prior to admission.  Family does her grocery shopping and takes care providing transportation to any appointments.  Presented to [facility] 06/28/2021 with right side weakness and global aphasia.  CT/MRI showed multiple acute cortical subcortical left MCA territory infarcts within the left frontal parietal and temporal lobes.  The largest infarct within the mid to posterior left frontal lobe, left frontal operculum and left insula measuring 2.8 x 2.6 x 4.4 cm.  SWI signal loss present at site of dominant infarct compatible with petechial hemorrhage/hemorrhagic conversion.  Only mild mass-effect noted.  CT angiogram head and neck occlusion of distal M2/proximal M3 left MCA vessel.  Patient did not receive tPA.  EKG did show new onset atrial fibrillation with rate controlled.  She was transferred to [facility].  Echocardiogram with ejection fraction of 60 to 65% no wall motion abnormalities.  Follow-up cranial CT scan 06/29/2021 showed no significant change.  No new intracranial abnormality.  Incidental finding of 6 x 6 mm aneurysm projecting from distal cavernous/paraclinoid left ICA discussed with interventional radiology follow-up outpatient.  She was cleared to begin aspirin 81 mg daily considering DOAC once hemorrhage resolved with follow-up cranial CT.  She was cleared to begin Lovenox for DVT prophylaxis 06/30/2021.  Presently on a mechanical soft diet with thin liquids.  Therapy evaluations completed due to patient's aphasia as well as right-sided weakness was admitted for a comprehensive rehab program.   Per pt's family, she's writing single words legibly asking to go home.  She never had dx of  prediabetes.  They are wondering if she has UTI- urine smells bad and made faces like peeing is painful.  Not sure when she had her last BM.  Used to take Lisinopril for HTN- no other meds at home.    Review of Systems  Constitutional:  Negative for chills and fever.  HENT:  Negative for hearing loss.   Eyes:  Negative for blurred vision and double vision.  Respiratory:  Negative for cough and shortness of breath.   Cardiovascular:  Negative for chest pain, palpitations and leg swelling.  Gastrointestinal:  Positive for constipation. Negative for heartburn, nausea and vomiting.       GERD  Genitourinary:  Negative for dysuria, flank pain and hematuria.  Musculoskeletal:  Positive for joint pain and myalgias.  Skin:  Negative for rash.  Neurological:  Positive for speech change and weakness.  All other systems reviewed and are negative. Past Medical History:  Diagnosis Date   Hypertension    Past Surgical History:  Procedure Laterality Date   ABDOMINAL SURGERY     Galbladder   No family history on file. Social History:  reports that she has never smoked. She has never used smokeless tobacco. She reports that she does not currently use alcohol. She reports that she does not use drugs. Allergies: No Known Allergies No medications prior to admission.    Drug Regimen Review Drug regimen was reviewed and remains appropriate with no significant issues identified  Home: Home Living Family/patient expects to be discharged to:: Private residence Living Arrangements: Alone Available Help at Discharge: Family, Available PRN/intermittently Type of Home: House Home Access: Stairs to enter [employer] of Steps: 2 Home  Layout: One level Bathroom Shower/Tub: Chiropodist: Standard Home Equipment: Cane - single point, Conservation officer, nature (2 wheels)  Lives With: Alone   Functional History: Prior Function Prior Level of Function : Independent/Modified  Independent Mobility Comments: amb without AD, doesn't drive - family does grocery shopping and takes her to her appointments ADLs Comments: per dtr pt was indep  Functional Status:  Mobility: Bed Mobility Overal bed mobility: Needs Assistance Bed Mobility: Sit to Supine Supine to sit: Min guard Sit to supine: Min guard General bed mobility comments: pt brought self up to long sit in bed per family and was trying to get OOB to use bathroom Transfers Overall transfer level: Needs assistance Equipment used: 1 person hand held assist Transfers: Sit to/from Stand, Bed to chair/wheelchair/BSC Sit to Stand: Min assist Bed to/from chair/wheelchair/BSC transfer type:: Stand pivot Stand pivot transfers: Mod assist, +2 safety/equipment General transfer comment: max directional verbal and tactile cues to complete transfer to [facility] and then to chair Ambulation/Gait Ambulation/Gait assistance: +2 physical assistance, Mod assist Gait Distance (Feet): 30 Feet (to the door and back) Assistive device: 2 person hand held assist Gait Pattern/deviations: Step-through pattern, Decreased stride length, Decreased dorsiflexion - right, Decreased weight shift to right General Gait Details: pt with decreased R LE step height and length, increased use of R UE to push down into PT compared to L UE per RN who reports she was just holding her hand but pt wasn't pushing down into it Gait velocity: slow Gait velocity interpretation: <1.8 ft/sec, indicate of risk for recurrent falls    ADL: ADL Overall ADL's : Needs assistance/impaired Eating/Feeding: Minimal assistance Grooming: Moderate assistance Upper Body Bathing: Minimal assistance Lower Body Bathing: Moderate assistance Upper Body Dressing : Moderate assistance Lower Body Dressing: Moderate assistance, Sit to/from stand Toilet Transfer: Minimal assistance Functional mobility during ADLs: Minimal assistance  Cognition: Cognition Overall Cognitive  Status: Difficult to assess Orientation Level: Disoriented X4 Cognition Arousal/Alertness: Awake/alert Behavior During Therapy: Flat affect, Impulsive Overall Cognitive Status: Difficult to assess Area of Impairment: Problem solving, Safety/judgement, Following commands Following Commands: Follows one step commands with increased time, Follows one step commands inconsistently (benefits from demonstration, pt did recognize sock and with increased time put on socks at EOB) Safety/Judgement: Decreased awareness of safety, Decreased awareness of deficits Problem Solving: Difficulty sequencing, Requires verbal cues, Requires tactile cues (increased time to don socks) General Comments: difficulty to assess due to global aphasia, pt answered "yes" to all questions including "What is your first name?" "i your name Manuela Schwartz?"  Physical Exam: Blood pressure (!) 154/109, pulse 96, temperature 97.7 F (36.5 C), temperature source Oral, resp. rate 17, height 5\' 5"  (1.651 m), weight 64.9 kg, SpO2 95 %. Physical Exam Vitals and nursing note reviewed. Exam conducted with a chaperone present.  Constitutional:      Appearance: She is normal weight.     Comments: Pt appears younger than stated age; sitting up in bed; after interacting with me; got sleepy; nodded off; granddaughters in room; NAD; wearing eyeglasses  HENT:     Head: Normocephalic and atraumatic.     Comments: R facial droop- tongue midline; not sure if sensation intact on face?    Right Ear: External ear normal.     Left Ear: External ear normal.     Nose: Nose normal. No congestion.     Mouth/Throat:     Mouth: Mucous membranes are dry.     Pharynx: Oropharynx is clear. No oropharyngeal exudate.  Eyes:  General:        Right eye: No discharge.        Left eye: No discharge.     Extraocular Movements: Extraocular movements intact.     Comments: No nystagmus  Cardiovascular:     Rate and Rhythm: Rhythm irregular.     Heart sounds:  Normal heart sounds. No murmur heard.   No gallop.     Comments: Borderline tachycardic; no JVD Pulmonary:     Comments: CTA B/L- no W/R/R- good air movement  Abdominal:     General: Abdomen is flat.     Palpations: Abdomen is soft.     Comments: Hypoactive BS; NT; ND; Soft  Genitourinary:    Comments: Purewick in place- no odor in room Musculoskeletal:     Cervical back: Normal range of motion. No rigidity.     Comments: 5/5 in LUE/LLE Considering difficulty testing RUE/RLE- at least 4/5- but couldn't comply with entire exam  Skin:    General: Skin is warm and dry.  Neurological:     Mental Status: She is alert.     Comments: Patient is alert.  No acute distress.  She is aphasic.  She would not follow commands but mimic some actions.  Pt was trying to talk- couldn't make sounds- wrote legible words- go home on paper- following directions much better- still nonverbal Followed instructions ~ 60-70% of time Appears to have decreased sensation to light touch on R side per pt shaking head no-answered the same info if negative or positive answer- so think correct  Psychiatric:     Comments: Sleepy esp after exam    Results for orders placed or performed during the hospital encounter of 06/29/21 (from the past 48 hour(s))  Glucose, capillary     Status: Abnormal   Collection Time: 06/29/21  7:43 AM  Result Value Ref Range   Glucose-Capillary 114 (H) 70 - 99 mg/dL    Comment: Glucose reference range applies only to samples taken after fasting for at least 8 hours.  Glucose, capillary     Status: None   Collection Time: 06/29/21 12:39 PM  Result Value Ref Range   Glucose-Capillary 91 70 - 99 mg/dL    Comment: Glucose reference range applies only to samples taken after fasting for at least 8 hours.  CBC     Status: Abnormal   Collection Time: 06/29/21  2:14 PM  Result Value Ref Range   WBC 8.2 4.0 - 10.5 K/uL   RBC 4.15 3.87 - 5.11 MIL/uL   Hemoglobin 11.9 (L) 12.0 - 15.0 g/dL    HCT 36.9 36.0 - 46.0 %   MCV 88.9 80.0 - 100.0 fL   MCH 28.7 26.0 - 34.0 pg   MCHC 32.2 30.0 - 36.0 g/dL   RDW 12.4 11.5 - 15.5 %   Platelets 214 150 - 400 K/uL   nRBC 0.0 0.0 - 0.2 %    Comment: Performed at [facility], [place] [address], [place], [place] [id number]  Basic metabolic panel     Status: Abnormal   Collection Time: 06/29/21  2:14 PM  Result Value Ref Range   Sodium 137 135 - 145 mmol/L   Potassium 3.4 (L) 3.5 - 5.1 mmol/L   Chloride 103 98 - 111 mmol/L   CO2 25 22 - 32 mmol/L   Glucose, Bld 130 (H) 70 - 99 mg/dL    Comment: Glucose reference range applies only to samples taken after fasting for at least 8  hours.   BUN 21 8 - 23 mg/dL   Creatinine, Ser 1.611.08 (H) 0.44 - 1.00 mg/dL   Calcium 9.0 8.9 - 09.610.3 mg/dL   GFR, Estimated 48 (L) >60 mL/min    Comment: (NOTE) Calculated using the CKD-EPI Creatinine Equation (2021)    Anion gap 9 5 - 15    Comment: Performed at [facility], [address] [place], [place] [address]  Glucose, capillary     Status: Abnormal   Collection Time: 06/29/21  3:43 PM  Result Value Ref Range   Glucose-Capillary 103 (H) 70 - 99 mg/dL    Comment: Glucose reference range applies only to samples taken after fasting for at least 8 hours.  Glucose, capillary     Status: Abnormal   Collection Time: 06/29/21 11:22 PM  Result Value Ref Range   Glucose-Capillary 100 (H) 70 - 99 mg/dL    Comment: Glucose reference range applies only to samples taken after fasting for at least 8 hours.  Glucose, capillary     Status: Abnormal   Collection Time: 06/30/21  5:13 AM  Result Value Ref Range   Glucose-Capillary 112 (H) 70 - 99 mg/dL    Comment: Glucose reference range applies only to samples taken after fasting for at least 8 hours.  CBC     Status: Abnormal   Collection Time: 06/30/21  5:15 AM  Result Value Ref Range   WBC 10.2 4.0 - 10.5 K/uL   RBC 3.96 3.87 - 5.11 MIL/uL   Hemoglobin 11.6 (L) 12.0 - 15.0 g/dL   HCT 98.135.0 (L) 19.136.0 -  46.0 %   MCV 88.4 80.0 - 100.0 fL   MCH 29.3 26.0 - 34.0 pg   MCHC 33.1 30.0 - 36.0 g/dL   RDW 47.812.2 29.511.5 - 62.115.5 %   Platelets 211 150 - 400 K/uL   nRBC 0.0 0.0 - 0.2 %    Comment: Performed at [facility], [address] [place], [place] [address]  Basic metabolic panel     Status: Abnormal   Collection Time: 06/30/21  5:15 AM  Result Value Ref Range   Sodium 139 135 - 145 mmol/L   Potassium 3.4 (L) 3.5 - 5.1 mmol/L   Chloride 105 98 - 111 mmol/L   CO2 25 22 - 32 mmol/L   Glucose, Bld 107 (H) 70 - 99 mg/dL    Comment: Glucose reference range applies only to samples taken after fasting for at least 8 hours.   BUN 25 (H) 8 - 23 mg/dL   Creatinine, Ser 7.841.08 (H) 0.44 - 1.00 mg/dL   Calcium 8.6 (L) 8.9 - 10.3 mg/dL   GFR, Estimated 48 (L) >60 mL/min    Comment: (NOTE) Calculated using the CKD-EPI Creatinine Equation (2021)    Anion gap 9 5 - 15    Comment: Performed at [facility], [address] [place], [place] [address]  Glucose, capillary     Status: Abnormal   Collection Time: 06/30/21  7:30 AM  Result Value Ref Range   Glucose-Capillary 102 (H) 70 - 99 mg/dL    Comment: Glucose reference range applies only to samples taken after fasting for at least 8 hours.  Glucose, capillary     Status: Abnormal   Collection Time: 06/30/21 11:17 AM  Result Value Ref Range   Glucose-Capillary 177 (H) 70 - 99 mg/dL    Comment: Glucose reference range applies only to samples taken after fasting for at least 8  hours.  Glucose, capillary     Status: Abnormal   Collection Time: 06/30/21  3:17 PM  Result Value Ref Range   Glucose-Capillary 161 (H) 70 - 99 mg/dL    Comment: Glucose reference range applies only to samples taken after fasting for at least 8 hours.  Glucose, capillary     Status: Abnormal   Collection Time: 06/30/21  7:11 PM  Result Value Ref Range   Glucose-Capillary 143 (H) 70 - 99 mg/dL    Comment: Glucose reference range applies only to samples taken after  fasting for at least 8 hours.  Glucose, capillary     Status: Abnormal   Collection Time: 06/30/21 11:15 PM  Result Value Ref Range   Glucose-Capillary 139 (H) 70 - 99 mg/dL    Comment: Glucose reference range applies only to samples taken after fasting for at least 8 hours.  CBC     Status: Abnormal   Collection Time: 07/01/21  2:44 AM  Result Value Ref Range   WBC 10.0 4.0 - 10.5 K/uL   RBC 3.96 3.87 - 5.11 MIL/uL   Hemoglobin 11.5 (L) 12.0 - 15.0 g/dL   HCT 35.1 (L) 36.0 - 46.0 %   MCV 88.6 80.0 - 100.0 fL   MCH 29.0 26.0 - 34.0 pg   MCHC 32.8 30.0 - 36.0 g/dL   RDW 11.9 11.5 - 15.5 %   Platelets 202 150 - 400 K/uL   nRBC 0.0 0.0 - 0.2 %    Comment: Performed at [facility], [place] [address] [place], [place] [id number]  Basic metabolic panel     Status: Abnormal   Collection Time: 07/01/21  2:44 AM  Result Value Ref Range   Sodium 140 135 - 145 mmol/L   Potassium 3.4 (L) 3.5 - 5.1 mmol/L   Chloride 105 98 - 111 mmol/L   CO2 27 22 - 32 mmol/L   Glucose, Bld 130 (H) 70 - 99 mg/dL    Comment: Glucose reference range applies only to samples taken after fasting for at least 8 hours.   BUN 23 8 - 23 mg/dL   Creatinine, Ser 1.00 0.44 - 1.00 mg/dL   Calcium 8.7 (L) 8.9 - 10.3 mg/dL   GFR, Estimated 53 (L) >60 mL/min    Comment: (NOTE) Calculated using the CKD-EPI Creatinine Equation (2021)    Anion gap 8 5 - 15    Comment: Performed at [place] [address] [place], [place] [address]   CT HEAD WO CONTRAST (5MM)  Result Date: 06/29/2021 CLINICAL DATA:  87 year old female with left MCA M2/proximal M3 occlusion and MCA territory infarct. Left ICA 6 mm aneurysm. EXAM: CT HEAD WITHOUT CONTRAST TECHNIQUE: Contiguous axial images were obtained from the base of the skull through the vertex without intravenous contrast. COMPARISON:  Brain MRI, head CT 0251 hours today. CTA head and neck 06/28/2021. FINDINGS: Brain: No significant change an petechial hemorrhage in the  left middle MCA territory, now most conspicuous on series 2, image 17. Regional cytotoxic edema corresponding to abnormal diffusion. No significant midline shift or intracranial mass effect. Stable gray-white matter differentiation elsewhere. No extra-axial hemorrhage. Vascular: Calcified atherosclerosis at the skull base. Distal left ICA 6 mm aneurysm occult by plain CT. Skull: No acute osseous abnormality identified. Sinuses/Orbits: Visualized paranasal sinuses and mastoids are clear. Other: No acute orbit or scalp soft tissue finding. IMPRESSION: 1. No significant change since 0251 hours in the left middle MCA territory petechial hemorrhage and cytotoxic  edema. No significant mass effect. 2. No new intracranial abnormality. Distal left ICA 6 mm aneurysm occult by plain CT. Electronically Signed   By: Genevie Ann M.D.   On: 06/29/2021 10:35   DG CHEST PORT 1 VIEW  Result Date: 06/30/2021 CLINICAL DATA:  Fever, stroke EXAM: PORTABLE CHEST 1 VIEW COMPARISON:  06/28/2021 FINDINGS: The heart size and mediastinal contours are within normal limits. Both lungs are clear. The visualized skeletal structures are unremarkable. IMPRESSION: No acute abnormality of the lungs in AP portable projection. Electronically Signed   By: Delanna Ahmadi M.D.   On: 06/30/2021 10:01   ECHOCARDIOGRAM COMPLETE  Result Date: 06/29/2021    ECHOCARDIOGRAM REPORT   Patient Name:   SHER COLAW Date of Exam: 06/29/2021 Medical Rec #:  [id number]         Height:       65.0 in Accession #:    [id number]        Weight:       143.1 lb Date of Birth:  06-Sep-1927         BSA:          1.716 m Patient Age:    17 years          BP:           129/64 mmHg Patient Gender: F                 HR:           77 bpm. Exam Location:  Inpatient Procedure: 2D Echo, Cardiac Doppler and Color Doppler Indications:    Stroke  History:        Patient has prior history of Echocardiogram examinations. Risk                 Factors:Hypertension.  Sonographer:     Clayton Lefort RDCS (AE) Referring Phys: [id number] [place]  1. Left ventricular ejection fraction, by estimation, is 60 to 65%. The left ventricle has normal function. The left ventricle has no regional wall motion abnormalities. There is moderate concentric left ventricular hypertrophy. Left ventricular diastolic function could not be evaluated.  2. Right ventricular systolic function is normal. The right ventricular size is normal. There is normal pulmonary artery systolic pressure.  3. Left atrial size was moderately dilated.  4. Right atrial size was moderately dilated.  5. The mitral valve is normal in structure. Trivial mitral valve regurgitation. No evidence of mitral stenosis.  6. Tricuspid valve regurgitation is mild to moderate.  7. The aortic valve is tricuspid. There is mild calcification of the aortic valve. Aortic valve regurgitation is not visualized. Aortic valve sclerosis/calcification is present, without any evidence of aortic stenosis.  8. The inferior vena cava is normal in size with greater than 50% respiratory variability, suggesting right atrial pressure of 3 mmHg. FINDINGS  Left Ventricle: Left ventricular ejection fraction, by estimation, is 60 to 65%. The left ventricle has normal function. The left ventricle has no regional wall motion abnormalities. The left ventricular internal cavity size was normal in size. There is  moderate concentric left ventricular hypertrophy. Left ventricular diastolic function could not be evaluated due to atrial fibrillation. Left ventricular diastolic function could not be evaluated. Right Ventricle: The right ventricular size is normal. No increase in right ventricular wall thickness. Right ventricular systolic function is normal. There is normal pulmonary artery systolic pressure. The tricuspid regurgitant velocity is 2.44 m/s, and  with an assumed right atrial pressure of 8  mmHg, the estimated right ventricular systolic pressure is 31.8  mmHg. Left Atrium: Left atrial size was moderately dilated. Right Atrium: Right atrial size was moderately dilated. Pericardium: There is no evidence of pericardial effusion. Mitral Valve: The mitral valve is normal in structure. There is mild calcification of the mitral valve leaflet(s). Trivial mitral valve regurgitation. No evidence of mitral valve stenosis. MV peak gradient, 7.1 mmHg. The mean mitral valve gradient is 2.0  mmHg. Tricuspid Valve: The tricuspid valve is normal in structure. Tricuspid valve regurgitation is mild to moderate. No evidence of tricuspid stenosis. Aortic Valve: The aortic valve is tricuspid. There is mild calcification of the aortic valve. Aortic valve regurgitation is not visualized. Aortic valve sclerosis/calcification is present, without any evidence of aortic stenosis. Aortic valve mean gradient measures 3.0 mmHg. Aortic valve peak gradient measures 5.0 mmHg. Aortic valve area, by VTI measures 1.77 cm. Pulmonic Valve: The pulmonic valve was not well visualized. Pulmonic valve regurgitation is not visualized. No evidence of pulmonic stenosis. Aorta: The aortic root is normal in size and structure. Venous: The inferior vena cava is normal in size with greater than 50% respiratory variability, suggesting right atrial pressure of 3 mmHg. IAS/Shunts: No atrial level shunt detected by color flow Doppler.  LEFT VENTRICLE PLAX 2D LVIDd:         3.40 cm   Diastology LVIDs:         2.30 cm   LV e' medial:    7.36 cm/s LV PW:         1.40 cm   LV E/e' medial:  16.7 LV IVS:        1.50 cm   LV e' lateral:   9.11 cm/s LVOT diam:     2.00 cm   LV E/e' lateral: 13.5 LV SV:         41 LV SV Index:   24 LVOT Area:     3.14 cm  RIGHT VENTRICLE             IVC RV Basal diam:  3.30 cm     IVC diam: 1.50 cm RV S prime:     11.90 cm/s TAPSE (M-mode): 1.2 cm LEFT ATRIUM           Index        RIGHT ATRIUM           Index LA diam:      2.90 cm 1.69 cm/m   RA Area:     15.50 cm LA Vol (A2C): 30.4 ml  17.72 ml/m  RA Volume:   39.00 ml  22.73 ml/m LA Vol (A4C): 56.6 ml 32.99 ml/m  AORTIC VALVE AV Area (Vmax):    1.97 cm AV Area (Vmean):   1.61 cm AV Area (VTI):     1.77 cm AV Vmax:           112.00 cm/s AV Vmean:          89.700 cm/s AV VTI:            0.229 m AV Peak Grad:      5.0 mmHg AV Mean Grad:      3.0 mmHg LVOT Vmax:         70.30 cm/s LVOT Vmean:        46.000 cm/s LVOT VTI:          0.129 m LVOT/AV VTI ratio: 0.56  AORTA Ao Root diam: 2.70 cm Ao Asc diam:  2.90 cm MITRAL VALVE  TRICUSPID VALVE MV Area (PHT): 3.08 cm     TR Peak grad:   23.8 mmHg MV Area VTI:   1.05 cm     TR Vmax:        244.00 cm/s MV Peak grad:  7.1 mmHg MV Mean grad:  2.0 mmHg     SHUNTS MV Vmax:       1.33 m/s     Systemic VTI:  0.13 m MV Vmean:      59.5 cm/s    Systemic Diam: 2.00 cm MV Decel Time: 246 msec MV E velocity: 123.00 cm/s MV A velocity: 30.40 cm/s MV E/A ratio:  4.05 Arvilla Meres MD Electronically signed by Arvilla Meres MD Signature Date/Time: 06/29/2021/4:21:57 PM    Final        Medical Problem List and Plan: 1. Functional deficits left-sided weakness and aphasia secondary to left MCA infarct with hemorrhagic conversion, embolic pattern likely due to new diagnosis of a flutter  -patient may  shower  -ELOS/Goals: 16-18 days- min A PT/OT and mod A SLP 2.  Antithrombotics: -DVT/anticoagulation:  Pharmaceutical: Lovenox  -antiplatelet therapy: Aspirin 81 mg daily.  Considering DOAC once hemorrhage resolved. 3. Pain Management: Tylenol as needed 4. Mood: Provide emotional support  -antipsychotic agents: N/A 5. Neuropsych: This patient is not capable of making decisions on her own behalf. 6. Skin/Wound Care: Routine skin checks 7. Fluids/Electrolytes/Nutrition: Routine in and outs with follow-up chemistries 8.  Permissive hypertension.  Patient on no home medications per chart, but granddaughter thought on Lisinopril? 9.  New diagnosis a flutter.  Rate controlled.  Continue  aspirin. 10.  Incidental finding cerebral aneurysm.  Follow-up outpatient interventional radiology 11.  Borderline enlarged right tracheal bronchial mediastinal lymph node.  Consider CT chest as outpatient. 12. Dysuria vs urinary odor- will see if team thinks U/A is appropriate-  13 Prediabetic- is new dx- A1c 6.0- due to age, will see if team thinks should be aggressive on this.    I have personally performed a face to face diagnostic evaluation of this patient and formulated the key components of the plan.  Additionally, I have personally reviewed laboratory data, imaging studies, as well as relevant notes and concur with the physician assistant's documentation above.   The patient's status has not changed from the original H&P.  Any changes in documentation from the acute care chart have been noted above.       Mcarthur Rossetti Angiulli, PA-C 07/01/2021

## 2021-07-01 NOTE — Discharge Instructions (Addendum)
Inpatient Rehab Discharge Instructions  Shawna Hopkins Discharge date and time: 07/11/21   Activities/Precautions/ Functional Status: Activity: As tolerated Diet: Mechanical soft Wound Care: Routine skin checks   Functional status:  ___ No restrictions     ___ Walk up steps independently _X__ 24/7 supervision/assistance   ___ Walk up steps with assistance ___ Intermittent supervision/assistance  ___ Bathe/dress independently ___ Walk with walker     __x_ Bathe/dress with assistance ___ Walk Independently    ___ Shower independently ___ Walk with assistance    ___ Shower with assistance _X__ No alcohol     ___ Return to work/school ________   COMMUNITY REFERRALS UPON DISCHARGE:    Home Health:   PT     OT     ST    RN                    Agency: [facility]  Phone:  [phone]   Medical Equipment/Items Ordered: Transfer Bench, Bedside Commode                                                 Agency/Supplier: [facility] [phone]  Special Instructions: No driving, smoking or alcohol 2.  Follow-up interventional radiology Dr.Devenshar [phone] for findings of incidental cerebral aneurysm   My questions have been answered and I understand these instructions. I will adhere to these goals and the provided educational materials after my discharge from the hospital.  Patient/Caregiver Signature _______________________________ Date __________  Clinician Signature _______________________________________ Date __________  Please bring this form and your medication list with you to all your follow-up doctor's appointments.  STROKE/TIA DISCHARGE INSTRUCTIONS SMOKING Cigarette smoking nearly doubles your risk of having a stroke & is the single most alterable risk factor  If you smoke or have smoked in the last 12 months, you are advised to quit smoking for your health. Most of the excess cardiovascular risk related to smoking disappears within a year of stopping. Ask you doctor about  anti-smoking medications [facility] Quit Line: 1-800-QUIT NOW Free Smoking Cessation Classes [phone]  CHOLESTEROL Know your levels; limit fat & cholesterol in your diet  Lipid Panel     Component Value Date/Time   CHOL 134 06/29/2021 0454   TRIG 52 06/29/2021 0454   HDL 55 06/29/2021 0454   CHOLHDL 2.4 06/29/2021 0454   VLDL 10 06/29/2021 0454   LDLCALC 69 06/29/2021 0454     Many patients benefit from treatment even if their cholesterol is at goal. Goal: Total Cholesterol (CHOL) less than 160 Goal:  Triglycerides (TRIG) less than 150 Goal:  HDL greater than 40 Goal:  LDL (LDLCALC) less than 100   BLOOD PRESSURE American Stroke Association blood pressure target is less that 120/80 mm/Hg  Your discharge blood pressure is:  BP: (!) 176/85 Monitor your blood pressure Limit your salt and alcohol intake Many individuals will require more than one medication for high blood pressure  DIABETES (A1c is a blood sugar average for last 3 months) Goal HGBA1c is under 7% (HBGA1c is blood sugar average for last 3 months)  Diabetes: No known diagnosis of diabetes    Lab Results  Component Value Date   HGBA1C 6.0 (H) 06/29/2021    Your HGBA1c can be lowered with medications, healthy diet, and exercise. Check your blood sugar as directed by your physician Call your physician if  you experience unexplained or low blood sugars.  PHYSICAL ACTIVITY/REHABILITATION Goal is 30 minutes at least 4 days per week  Activity: Increase activity slowly, Therapies: Physical Therapy: Home Health Return to work:  Activity decreases your risk of heart attack and stroke and makes your heart stronger.  It helps control your weight and blood pressure; helps you relax and can improve your mood. Participate in a regular exercise program. Talk with your doctor about the best form of exercise for you (dancing, walking, swimming, cycling).  DIET/WEIGHT Goal is to maintain a healthy weight  Your discharge diet is:  Diet  Order             DIET DYS 3 Room service appropriate? Yes; Fluid consistency: Thin  Diet effective now                   liquids Your height is:  Height: 5\' 5"  (165.1 cm) Your current weight is: Weight: 62.6 kg Your Body Mass Index (BMI) is:  BMI (Calculated): 22.97 Following the type of diet specifically designed for you will help prevent another stroke. Your goal weight range is:   Your goal Body Mass Index (BMI) is 19-24. Healthy food habits can help reduce 3 risk factors for stroke:  High cholesterol, hypertension, and excess weight.  RESOURCES Stroke/Support Group:  Call [phone]   STROKE EDUCATION PROVIDED/REVIEWED AND GIVEN TO PATIENT Stroke warning signs and symptoms How to activate emergency medical system (call 911). Medications prescribed at discharge. Need for follow-up after discharge. Personal risk factors for stroke. Pneumonia vaccine given: No Flu vaccine given: No My questions have been answered, the writing is legible, and I understand these instructions.  I will adhere to these goals & educational materials that have been provided to me after my discharge from the hospital.    ____ Information on my medicine - ELIQUIS (apixaban)  This medication education was reviewed with me or my healthcare representative as part of my discharge preparation.  Why was Eliquis prescribed for you? Eliquis was prescribed for you to reduce the risk of forming blood clots that can cause a stroke if you have a medical condition called atrial fibrillation (a type of irregular heartbeat) OR to reduce the risk of a blood clots forming after orthopedic surgery.  What do You need to know about Eliquis ? Take your Eliquis TWICE DAILY - one tablet in the morning and one tablet in the evening with or without food.  It would be best to take the doses about the same time each day.  If you have difficulty swallowing the tablet whole please discuss with your pharmacist how to take  the medication safely.  Take Eliquis exactly as prescribed by your doctor and DO NOT stop taking Eliquis without talking to the doctor who prescribed the medication.  Stopping may increase your risk of developing a new clot or stroke.  Refill your prescription before you run out.  After discharge, you should have regular check-up appointments with your healthcare provider that is prescribing your Eliquis.  In the future your dose may need to be changed if your kidney function or weight changes by a significant amount or as you get older.  What do you do if you miss a dose? If you miss a dose, take it as soon as you remember on the same day and resume taking twice daily.  Do not take more than one dose of ELIQUIS at the same time.  Important Safety Information A possible side  effect of Eliquis is bleeding. You should call your healthcare provider right away if you experience any of the following: Bleeding from an injury or your nose that does not stop. Unusual colored urine (red or dark brown) or unusual colored stools (red or black). Unusual bruising for unknown reasons. A serious fall or if you hit your head (even if there is no bleeding).  Some medicines may interact with Eliquis and might increase your risk of bleeding or clotting while on Eliquis. To help avoid this, consult your healthcare provider or pharmacist prior to using any new prescription or non-prescription medications, including herbals, vitamins, non-steroidal anti-inflammatory drugs (NSAIDs) and supplements.  This website has more information on Eliquis (apixaban): [email or web address].

## 2021-07-01 NOTE — H&P (Signed)
Physical Medicine and Rehabilitation Admission H&P    CC: global aphasia/code stroke   HPI: Shawna Hopkins is a 87 year old right-handed female with history of hypertension as well as GERD.  Per chart review patient lives alone.  1 level home 2 steps to entry.  Daughter reports patient independent prior to admission.  Family does her grocery shopping and takes care providing transportation to any appointments.  Presented to [facility] 06/28/2021 with right side weakness and global aphasia.  CT/MRI showed multiple acute cortical subcortical left MCA territory infarcts within the left frontal parietal and temporal lobes.  The largest infarct within the mid to posterior left frontal lobe, left frontal operculum and left insula measuring 2.8 x 2.6 x 4.4 cm.  SWI signal loss present at site of dominant infarct compatible with petechial hemorrhage/hemorrhagic conversion.  Only mild mass-effect noted.  CT angiogram head and neck occlusion of distal M2/proximal M3 left MCA vessel.  Patient did not receive tPA.  EKG did show new onset atrial fibrillation with rate controlled.  She was transferred to [facility].  Echocardiogram with ejection fraction of 60 to 65% no wall motion abnormalities.  Follow-up cranial CT scan 06/29/2021 showed no significant change.  No new intracranial abnormality.  Incidental finding of 6 x 6 mm aneurysm projecting from distal cavernous/paraclinoid left ICA discussed with interventional radiology follow-up outpatient.  She was cleared to begin aspirin 81 mg daily considering DOAC once hemorrhage resolved with follow-up cranial CT.  She was cleared to begin Lovenox for DVT prophylaxis 06/30/2021.  Presently on a mechanical soft diet with thin liquids.  Therapy evaluations completed due to patient's aphasia as well as right-sided weakness was admitted for a comprehensive rehab program.     Per pt's family, she's writing single words legibly asking to go home.  She never had dx  of prediabetes.  They are wondering if she has UTI- urine smells bad and made faces like peeing is painful.  Not sure when she had her last BM.  Used to take Lisinopril for HTN- no other meds at home.      Review of Systems  Constitutional:  Negative for chills and fever.  HENT:  Negative for hearing loss.   Eyes:  Negative for blurred vision and double vision.  Respiratory:  Negative for cough and shortness of breath.   Cardiovascular:  Negative for chest pain, palpitations and leg swelling.  Gastrointestinal:  Positive for constipation. Negative for heartburn, nausea and vomiting.       GERD  Genitourinary:  Negative for dysuria, flank pain and hematuria.  Musculoskeletal:  Positive for joint pain and myalgias.  Skin:  Negative for rash.  Neurological:  Positive for speech change and weakness.  All other systems reviewed and are negative.     Past Medical History:  Diagnosis Date   Hypertension           Past Surgical History:  Procedure Laterality Date   ABDOMINAL SURGERY        Galbladder    No family history on file. Social History:  reports that she has never smoked. She has never used smokeless tobacco. She reports that she does not currently use alcohol. She reports that she does not use drugs. Allergies: No Known Allergies No medications prior to admission.      Drug Regimen Review Drug regimen was reviewed and remains appropriate with no significant issues identified   Home: Home Living Family/patient expects to be discharged to:: Private residence Living  Arrangements: Alone Available Help at Discharge: Family, Available PRN/intermittently Type of Home: House Home Access: Stairs to enter [employer] of Steps: 2 Home Layout: One level Bathroom Shower/Tub: Chiropodist: Standard Home Equipment: Cane - single point, Conservation officer, nature (2 wheels)  Lives With: Alone   Functional History: Prior Function Prior Level of Function :  Independent/Modified Independent Mobility Comments: amb without AD, doesn't drive - family does grocery shopping and takes her to her appointments ADLs Comments: per dtr pt was indep   Functional Status:  Mobility: Bed Mobility Overal bed mobility: Needs Assistance Bed Mobility: Sit to Supine Supine to sit: Min guard Sit to supine: Min guard General bed mobility comments: pt brought self up to long sit in bed per family and was trying to get OOB to use bathroom Transfers Overall transfer level: Needs assistance Equipment used: 1 person hand held assist Transfers: Sit to/from Stand, Bed to chair/wheelchair/BSC Sit to Stand: Min assist Bed to/from chair/wheelchair/BSC transfer type:: Stand pivot Stand pivot transfers: Mod assist, +2 safety/equipment General transfer comment: max directional verbal and tactile cues to complete transfer to [facility] and then to chair Ambulation/Gait Ambulation/Gait assistance: +2 physical assistance, Mod assist Gait Distance (Feet): 30 Feet (to the door and back) Assistive device: 2 person hand held assist Gait Pattern/deviations: Step-through pattern, Decreased stride length, Decreased dorsiflexion - right, Decreased weight shift to right General Gait Details: pt with decreased R LE step height and length, increased use of R UE to push down into PT compared to L UE per RN who reports she was just holding her hand but pt wasn't pushing down into it Gait velocity: slow Gait velocity interpretation: <1.8 ft/sec, indicate of risk for recurrent falls   ADL: ADL Overall ADL's : Needs assistance/impaired Eating/Feeding: Minimal assistance Grooming: Moderate assistance Upper Body Bathing: Minimal assistance Lower Body Bathing: Moderate assistance Upper Body Dressing : Moderate assistance Lower Body Dressing: Moderate assistance, Sit to/from stand Toilet Transfer: Minimal assistance Functional mobility during ADLs: Minimal assistance    Cognition: Cognition Overall Cognitive Status: Difficult to assess Orientation Level: Disoriented X4 Cognition Arousal/Alertness: Awake/alert Behavior During Therapy: Flat affect, Impulsive Overall Cognitive Status: Difficult to assess Area of Impairment: Problem solving, Safety/judgement, Following commands Following Commands: Follows one step commands with increased time, Follows one step commands inconsistently (benefits from demonstration, pt did recognize sock and with increased time put on socks at EOB) Safety/Judgement: Decreased awareness of safety, Decreased awareness of deficits Problem Solving: Difficulty sequencing, Requires verbal cues, Requires tactile cues (increased time to don socks) General Comments: difficulty to assess due to global aphasia, pt answered "yes" to all questions including "What is your first name?" "i your name Manuela Schwartz?"   Physical Exam: Blood pressure (!) 154/109, pulse 96, temperature 97.7 F (36.5 C), temperature source Oral, resp. rate 17, height 5\' 5"  (1.651 m), weight 64.9 kg, SpO2 95 %. Physical Exam Vitals and nursing note reviewed. Exam conducted with a chaperone present.  Constitutional:      Appearance: She is normal weight.     Comments: Pt appears younger than stated age; sitting up in bed; after interacting with me; got sleepy; nodded off; granddaughters in room; NAD; wearing eyeglasses  HENT:     Head: Normocephalic and atraumatic.     Comments: R facial droop- tongue midline; not sure if sensation intact on face?    Right Ear: External ear normal.     Left Ear: External ear normal.     Nose: Nose normal. No congestion.  Mouth/Throat:     Mouth: Mucous membranes are dry.     Pharynx: Oropharynx is clear. No oropharyngeal exudate.  Eyes:     General:        Right eye: No discharge.        Left eye: No discharge.     Extraocular Movements: Extraocular movements intact.     Comments: No nystagmus  Cardiovascular:     Rate and  Rhythm: Rhythm irregular.     Heart sounds: Normal heart sounds. No murmur heard.   No gallop.     Comments: Borderline tachycardic; no JVD Pulmonary:     Comments: CTA B/L- no W/R/R- good air movement   Abdominal:     General: Abdomen is flat.     Palpations: Abdomen is soft.     Comments: Hypoactive BS; NT; ND; Soft  Genitourinary:    Comments: Purewick in place- no odor in room Musculoskeletal:     Cervical back: Normal range of motion. No rigidity.     Comments: 5/5 in LUE/LLE Considering difficulty testing RUE/RLE- at least 4/5- but couldn't comply with entire exam  Skin:    General: Skin is warm and dry.  Neurological:     Mental Status: She is alert.     Comments: Patient is alert.  No acute distress.  She is aphasic.  She would not follow commands but mimic some actions.   Pt was trying to talk- couldn't make sounds- wrote legible words- go home on paper- following directions much better- still nonverbal Followed instructions ~ 60-70% of time Appears to have decreased sensation to light touch on R side per pt shaking head no-answered the same info if negative or positive answer- so think correct  Psychiatric:     Comments: Sleepy esp after exam      Lab Results Last 48 Hours        Results for orders placed or performed during the hospital encounter of 06/29/21 (from the past 48 hour(s))  Glucose, capillary     Status: Abnormal    Collection Time: 06/29/21  7:43 AM  Result Value Ref Range    Glucose-Capillary 114 (H) 70 - 99 mg/dL      Comment: Glucose reference range applies only to samples taken after fasting for at least 8 hours.  Glucose, capillary     Status: None    Collection Time: 06/29/21 12:39 PM  Result Value Ref Range    Glucose-Capillary 91 70 - 99 mg/dL      Comment: Glucose reference range applies only to samples taken after fasting for at least 8 hours.  CBC     Status: Abnormal    Collection Time: 06/29/21  2:14 PM  Result Value Ref Range    WBC  8.2 4.0 - 10.5 K/uL    RBC 4.15 3.87 - 5.11 MIL/uL    Hemoglobin 11.9 (L) 12.0 - 15.0 g/dL    HCT 36.9 36.0 - 46.0 %    MCV 88.9 80.0 - 100.0 fL    MCH 28.7 26.0 - 34.0 pg    MCHC 32.2 30.0 - 36.0 g/dL    RDW 12.4 11.5 - 15.5 %    Platelets 214 150 - 400 K/uL    nRBC 0.0 0.0 - 0.2 %      Comment: Performed at [facility], [place] [address] [place], [place] [id number]  Basic metabolic panel     Status: Abnormal    Collection Time: 06/29/21  2:14 PM  Result Value Ref Range    Sodium 137 135 - 145 mmol/L    Potassium 3.4 (L) 3.5 - 5.1 mmol/L    Chloride 103 98 - 111 mmol/L    CO2 25 22 - 32 mmol/L    Glucose, Bld 130 (H) 70 - 99 mg/dL      Comment: Glucose reference range applies only to samples taken after fasting for at least 8 hours.    BUN 21 8 - 23 mg/dL    Creatinine, Ser 1.08 (H) 0.44 - 1.00 mg/dL    Calcium 9.0 8.9 - 10.3 mg/dL    GFR, Estimated 48 (L) >60 mL/min      Comment: (NOTE) Calculated using the CKD-EPI Creatinine Equation (2021)      Anion gap 9 5 - 15      Comment: Performed at [facility] [address], [place], [place] [address]  Glucose, capillary     Status: Abnormal    Collection Time: 06/29/21  3:43 PM  Result Value Ref Range    Glucose-Capillary 103 (H) 70 - 99 mg/dL      Comment: Glucose reference range applies only to samples taken after fasting for at least 8 hours.  Glucose, capillary     Status: Abnormal    Collection Time: 06/29/21 11:22 PM  Result Value Ref Range    Glucose-Capillary 100 (H) 70 - 99 mg/dL      Comment: Glucose reference range applies only to samples taken after fasting for at least 8 hours.  Glucose, capillary     Status: Abnormal    Collection Time: 06/30/21  5:13 AM  Result Value Ref Range    Glucose-Capillary 112 (H) 70 - 99 mg/dL      Comment: Glucose reference range applies only to samples taken after fasting for at least 8 hours.  CBC     Status: Abnormal    Collection Time: 06/30/21  5:15 AM  Result Value  Ref Range    WBC 10.2 4.0 - 10.5 K/uL    RBC 3.96 3.87 - 5.11 MIL/uL    Hemoglobin 11.6 (L) 12.0 - 15.0 g/dL    HCT 35.0 (L) 36.0 - 46.0 %    MCV 88.4 80.0 - 100.0 fL    MCH 29.3 26.0 - 34.0 pg    MCHC 33.1 30.0 - 36.0 g/dL    RDW 12.2 11.5 - 15.5 %    Platelets 211 150 - 400 K/uL    nRBC 0.0 0.0 - 0.2 %      Comment: Performed at [facility], [place] [address] [place], [place] [id number]  Basic metabolic panel     Status: Abnormal    Collection Time: 06/30/21  5:15 AM  Result Value Ref Range    Sodium 139 135 - 145 mmol/L    Potassium 3.4 (L) 3.5 - 5.1 mmol/L    Chloride 105 98 - 111 mmol/L    CO2 25 22 - 32 mmol/L    Glucose, Bld 107 (H) 70 - 99 mg/dL      Comment: Glucose reference range applies only to samples taken after fasting for at least 8 hours.    BUN 25 (H) 8 - 23 mg/dL    Creatinine, Ser 1.08 (H) 0.44 - 1.00 mg/dL    Calcium 8.6 (L) 8.9 - 10.3 mg/dL    GFR, Estimated 48 (L) >60 mL/min      Comment: (NOTE) Calculated using the CKD-EPI Creatinine Equation (2021)  Anion gap 9 5 - 15      Comment: Performed at [facility] [address], [place], [place] [address]  Glucose, capillary     Status: Abnormal    Collection Time: 06/30/21  7:30 AM  Result Value Ref Range    Glucose-Capillary 102 (H) 70 - 99 mg/dL      Comment: Glucose reference range applies only to samples taken after fasting for at least 8 hours.  Glucose, capillary     Status: Abnormal    Collection Time: 06/30/21 11:17 AM  Result Value Ref Range    Glucose-Capillary 177 (H) 70 - 99 mg/dL      Comment: Glucose reference range applies only to samples taken after fasting for at least 8 hours.  Glucose, capillary     Status: Abnormal    Collection Time: 06/30/21  3:17 PM  Result Value Ref Range    Glucose-Capillary 161 (H) 70 - 99 mg/dL      Comment: Glucose reference range applies only to samples taken after fasting for at least 8 hours.  Glucose, capillary     Status: Abnormal     Collection Time: 06/30/21  7:11 PM  Result Value Ref Range    Glucose-Capillary 143 (H) 70 - 99 mg/dL      Comment: Glucose reference range applies only to samples taken after fasting for at least 8 hours.  Glucose, capillary     Status: Abnormal    Collection Time: 06/30/21 11:15 PM  Result Value Ref Range    Glucose-Capillary 139 (H) 70 - 99 mg/dL      Comment: Glucose reference range applies only to samples taken after fasting for at least 8 hours.  CBC     Status: Abnormal    Collection Time: 07/01/21  2:44 AM  Result Value Ref Range    WBC 10.0 4.0 - 10.5 K/uL    RBC 3.96 3.87 - 5.11 MIL/uL    Hemoglobin 11.5 (L) 12.0 - 15.0 g/dL    HCT 35.1 (L) 36.0 - 46.0 %    MCV 88.6 80.0 - 100.0 fL    MCH 29.0 26.0 - 34.0 pg    MCHC 32.8 30.0 - 36.0 g/dL    RDW 11.9 11.5 - 15.5 %    Platelets 202 150 - 400 K/uL    nRBC 0.0 0.0 - 0.2 %      Comment: Performed at [facility], [place] [address], [place], [place] [id number]  Basic metabolic panel     Status: Abnormal    Collection Time: 07/01/21  2:44 AM  Result Value Ref Range    Sodium 140 135 - 145 mmol/L    Potassium 3.4 (L) 3.5 - 5.1 mmol/L    Chloride 105 98 - 111 mmol/L    CO2 27 22 - 32 mmol/L    Glucose, Bld 130 (H) 70 - 99 mg/dL      Comment: Glucose reference range applies only to samples taken after fasting for at least 8 hours.    BUN 23 8 - 23 mg/dL    Creatinine, Ser 1.00 0.44 - 1.00 mg/dL    Calcium 8.7 (L) 8.9 - 10.3 mg/dL    GFR, Estimated 53 (L) >60 mL/min      Comment: (NOTE) Calculated using the CKD-EPI Creatinine Equation (2021)      Anion gap 8 5 - 15      Comment: Performed at [place],  [place] [address]       Imaging Results (Last 48 hours)  CT HEAD WO CONTRAST ( )   Result Date: 06/29/2021 CLINICAL DATA:  87 year old female with left MCA M2/proximal M3 occlusion and MCA territory infarct. Left ICA 6 mm aneurysm. EXAM: CT HEAD WITHOUT CONTRAST TECHNIQUE: Contiguous  axial images were obtained from the base of the skull through the vertex without intravenous contrast. COMPARISON:  Brain MRI, head CT 0251 hours today. CTA head and neck 06/28/2021. FINDINGS: Brain: No significant change an petechial hemorrhage in the left middle MCA territory, now most conspicuous on series 2, image 17. Regional cytotoxic edema corresponding to abnormal diffusion. No significant midline shift or intracranial mass effect. Stable gray-white matter differentiation elsewhere. No extra-axial hemorrhage. Vascular: Calcified atherosclerosis at the skull base. Distal left ICA 6 mm aneurysm occult by plain CT. Skull: No acute osseous abnormality identified. Sinuses/Orbits: Visualized paranasal sinuses and mastoids are clear. Other: No acute orbit or scalp soft tissue finding. IMPRESSION: 1. No significant change since 0251 hours in the left middle MCA territory petechial hemorrhage and cytotoxic edema. No significant mass effect. 2. No new intracranial abnormality. Distal left ICA 6 mm aneurysm occult by plain CT. Electronically Signed   By: Odessa Fleming M.D.   On: 06/29/2021 10:35    DG CHEST PORT 1 VIEW   Result Date: 06/30/2021 CLINICAL DATA:  Fever, stroke EXAM: PORTABLE CHEST 1 VIEW COMPARISON:  06/28/2021 FINDINGS: The heart size and mediastinal contours are within normal limits. Both lungs are clear. The visualized skeletal structures are unremarkable. IMPRESSION: No acute abnormality of the lungs in AP portable projection. Electronically Signed   By: Jearld Lesch M.D.   On: 06/30/2021 10:01    ECHOCARDIOGRAM COMPLETE   Result Date: 06/29/2021    ECHOCARDIOGRAM REPORT   Patient Name:   Shawna Hopkins Date of Exam: 06/29/2021 Medical Rec #:  [id number]         Height:       65.0 in Accession #:    [id number]        Weight:       143.1 lb Date of Birth:  January 28, 1928         BSA:          1.716 m Patient Age:    89 years          BP:           129/64 mmHg Patient Gender: F                 HR:            77 bpm. Exam Location:  Inpatient Procedure: 2D Echo, Cardiac Doppler and Color Doppler Indications:    Stroke  History:        Patient has prior history of Echocardiogram examinations. Risk                 Factors:Hypertension.  Sonographer:    Ross Ludwig RDCS (AE) Referring Phys: [id number] [facility] IMPRESSIONS  1. Left ventricular ejection fraction, by estimation, is 60 to 65%. The left ventricle has normal function. The left ventricle has no regional wall motion abnormalities. There is moderate concentric left ventricular hypertrophy. Left ventricular diastolic function could not be evaluated.  2. Right ventricular systolic function is normal. The right ventricular size is normal. There is normal pulmonary artery systolic pressure.  3. Left atrial size was moderately dilated.  4. Right atrial size was moderately dilated.  5. The mitral valve  is normal in structure. Trivial mitral valve regurgitation. No evidence of mitral stenosis.  6. Tricuspid valve regurgitation is mild to moderate.  7. The aortic valve is tricuspid. There is mild calcification of the aortic valve. Aortic valve regurgitation is not visualized. Aortic valve sclerosis/calcification is present, without any evidence of aortic stenosis.  8. The inferior vena cava is normal in size with greater than 50% respiratory variability, suggesting right atrial pressure of 3 mmHg. FINDINGS  Left Ventricle: Left ventricular ejection fraction, by estimation, is 60 to 65%. The left ventricle has normal function. The left ventricle has no regional wall motion abnormalities. The left ventricular internal cavity size was normal in size. There is  moderate concentric left ventricular hypertrophy. Left ventricular diastolic function could not be evaluated due to atrial fibrillation. Left ventricular diastolic function could not be evaluated. Right Ventricle: The right ventricular size is normal. No increase in right ventricular wall thickness. Right  ventricular systolic function is normal. There is normal pulmonary artery systolic pressure. The tricuspid regurgitant velocity is 2.44 m/s, and  with an assumed right atrial pressure of 8 mmHg, the estimated right ventricular systolic pressure is 99991111 mmHg. Left Atrium: Left atrial size was moderately dilated. Right Atrium: Right atrial size was moderately dilated. Pericardium: There is no evidence of pericardial effusion. Mitral Valve: The mitral valve is normal in structure. There is mild calcification of the mitral valve leaflet(s). Trivial mitral valve regurgitation. No evidence of mitral valve stenosis. MV peak gradient, 7.1 mmHg. The mean mitral valve gradient is 2.0  mmHg. Tricuspid Valve: The tricuspid valve is normal in structure. Tricuspid valve regurgitation is mild to moderate. No evidence of tricuspid stenosis. Aortic Valve: The aortic valve is tricuspid. There is mild calcification of the aortic valve. Aortic valve regurgitation is not visualized. Aortic valve sclerosis/calcification is present, without any evidence of aortic stenosis. Aortic valve mean gradient measures 3.0 mmHg. Aortic valve peak gradient measures 5.0 mmHg. Aortic valve area, by VTI measures 1.77 cm. Pulmonic Valve: The pulmonic valve was not well visualized. Pulmonic valve regurgitation is not visualized. No evidence of pulmonic stenosis. Aorta: The aortic root is normal in size and structure. Venous: The inferior vena cava is normal in size with greater than 50% respiratory variability, suggesting right atrial pressure of 3 mmHg. IAS/Shunts: No atrial level shunt detected by color flow Doppler.  LEFT VENTRICLE PLAX 2D LVIDd:         3.40 cm   Diastology LVIDs:         2.30 cm   LV e' medial:    7.36 cm/s LV PW:         1.40 cm   LV E/e' medial:  16.7 LV IVS:        1.50 cm   LV e' lateral:   9.11 cm/s LVOT diam:     2.00 cm   LV E/e' lateral: 13.5 LV SV:         41 LV SV Index:   24 LVOT Area:     3.14 cm  RIGHT VENTRICLE              IVC RV Basal diam:  3.30 cm     IVC diam: 1.50 cm RV S prime:     11.90 cm/s TAPSE (M-mode): 1.2 cm LEFT ATRIUM           Index        RIGHT ATRIUM           Index LA diam:  2.90 cm 1.69 cm/m   RA Area:     15.50 cm LA Vol (A2C): 30.4 ml 17.72 ml/m  RA Volume:   39.00 ml  22.73 ml/m LA Vol (A4C): 56.6 ml 32.99 ml/m  AORTIC VALVE AV Area (Vmax):    1.97 cm AV Area (Vmean):   1.61 cm AV Area (VTI):     1.77 cm AV Vmax:           112.00 cm/s AV Vmean:          89.700 cm/s AV VTI:            0.229 m AV Peak Grad:      5.0 mmHg AV Mean Grad:      3.0 mmHg LVOT Vmax:         70.30 cm/s LVOT Vmean:        46.000 cm/s LVOT VTI:          0.129 m LVOT/AV VTI ratio: 0.56  AORTA Ao Root diam: 2.70 cm Ao Asc diam:  2.90 cm MITRAL VALVE                TRICUSPID VALVE MV Area (PHT): 3.08 cm     TR Peak grad:   23.8 mmHg MV Area VTI:   1.05 cm     TR Vmax:        244.00 cm/s MV Peak grad:  7.1 mmHg MV Mean grad:  2.0 mmHg     SHUNTS MV Vmax:       1.33 m/s     Systemic VTI:  0.13 m MV Vmean:      59.5 cm/s    Systemic Diam: 2.00 cm MV Decel Time: 246 msec MV E velocity: 123.00 cm/s MV A velocity: 30.40 cm/s MV E/A ratio:  4.05 Glori Bickers MD Electronically signed by Glori Bickers MD Signature Date/Time: 06/29/2021/4:21:57 PM    Final              Medical Problem List and Plan: 1. Functional deficits left-sided weakness and aphasia secondary to left MCA infarct with hemorrhagic conversion, embolic pattern likely due to new diagnosis of a flutter             -patient may  shower             -ELOS/Goals: 16-18 days- min A PT/OT and mod A SLP 2.  Antithrombotics: -DVT/anticoagulation:  Pharmaceutical: Lovenox             -antiplatelet therapy: Aspirin 81 mg daily.  Considering DOAC once hemorrhage resolved. 3. Pain Management: Tylenol as needed 4. Mood: Provide emotional support             -antipsychotic agents: N/A 5. Neuropsych: This patient is not capable of making decisions on her own  behalf. 6. Skin/Wound Care: Routine skin checks 7. Fluids/Electrolytes/Nutrition: Routine in and outs with follow-up chemistries 8.  Permissive hypertension.  Patient on no home medications per chart, but granddaughter thought on Lisinopril? 9.  New diagnosis a flutter.  Rate controlled.  Continue aspirin. 10.  Incidental finding cerebral aneurysm.  Follow-up outpatient interventional radiology 11.  Borderline enlarged right tracheal bronchial mediastinal lymph node.  Consider CT chest as outpatient. 12. Dysuria vs urinary odor- will see if team thinks U/A is appropriate-  13 Prediabetic- is new dx- A1c 6.0- due to age, will see if team thinks should be aggressive on this.      I have personally performed a face to face diagnostic evaluation of  this patient and formulated the key components of the plan.  Additionally, I have personally reviewed laboratory data, imaging studies, as well as relevant notes and concur with the physician assistant's documentation above.   The patient's status has not changed from the original H&P.  Any changes in documentation from the acute care chart have been noted above.           Lavon Paganini Angiulli, PA-C 07/01/2021

## 2021-07-01 NOTE — Progress Notes (Signed)
Inpatient Rehab Admissions Coordinator:   I have a CIR bed for this Pt and will transfer today. RN may call report after 12pm.  Megan Salon, MS, CCC-SLP Rehab Admissions Coordinator  [phone] (celll) [phone] (office)

## 2021-07-01 NOTE — PMR Pre-admission (Signed)
PMR Admission Coordinator Pre-Admission Assessment  Patient: Shawna Hopkins is an 87 y.o., female MRN: [id number] DOB: 05-11-1928 Height: 5' 5"  (165.1 cm) Weight: 64.9 kg  Insurance Information HMO:     PPO:      PCP:      IPA:      80/20:      OTHER:  PRIMARY: Medicare       Policy#:  [id number]     Subscriber: Pt. Phone#: Verified online    Fax#:  Pre-Cert#:       Employer:  Benefits:  Phone #:      Name:  Eff. Date: Parts A ad B effective  Deduct: $1556      Out of Pocket Max:  None      Life Max: N/A  CIR: 100%      SNF: 100 days Outpatient: 80%     Co-Pay: 20% Home Health: 100%      Co-Pay: none DME: 80%     Co-Pay: 20% Providers: patient's choic SECONDARY: Medicaid [place] Access       Policy#: [id number]     Phone#:   Financial Counselor:       Phone#:   The Actuary for patients in Inpatient Rehabilitation Facilities with attached Privacy Act [place] Records was provided and verbally reviewed with: Family  Emergency Contact Information Contact Information     Name Relation Home Work Mobile   Tucker   [phone]   Lamar Laundry Sister [phone]         Current Medical History  Patient Admitting Diagnosis: CVA History of Present Illness: Shawna Hopkins is a 87 year old right-handed female with history of hypertension as well as GERD.  Per chart review patient lives alone.  1 level home 2 steps to entry.  Daughter reports patient independent prior to admission.  Family does her grocery shopping and takes care providing transportation to any appointments.  Presented to [facility] 06/28/2021 with right side weakness and global aphasia.  CT/MRI showed multiple acute cortical subcortical left MCA territory infarcts within the left frontal parietal and temporal lobes.  The largest infarct within the mid to posterior left frontal lobe, left frontal operculum and left insula measuring 2.8 x 2.6 x 4.4 cm.  SWI signal  loss present at site of dominant infarct compatible with petechial hemorrhage/hemorrhagic conversion.  Only mild mass-effect noted.  CT angiogram head and neck occlusion of distal M2/proximal M3 left MCA vessel.  Patient did not receive tPA.  EKG did show new onset atrial fibrillation with rate controlled.  She was transferred to [facility].  Echocardiogram with ejection fraction of 60 to 65% no wall motion abnormalities.  Follow-up cranial CT scan 06/29/2021 showed no significant change.  No new intracranial abnormality.  Incidental finding of 6 x 6 mm aneurysm projecting from distal cavernous/paraclinoid left ICA discussed with interventional radiology follow-up outpatient.  She was cleared to begin aspirin 81 mg daily considering DOAC once hemorrhage resolved with follow-up cranial CT.  She was cleared to begin Lovenox for DVT prophylaxis 06/30/2021.  Presently on a mechanical soft diet with thin liquids.  Therapy evaluations completed due to patient's aphasia as well as right-sided weakness was admitted for a comprehensive rehab program.  Complete NIHSS TOTAL: 7  Patient's medical record from [facility] has been reviewed by the rehabilitation admission coordinator and physician.  Past Medical History  Past Medical History:  Diagnosis Date   Hypertension     Has the  patient had major surgery during 100 days prior to admission? No  Family History   family history is not on file.  Current Medications  Current Facility-Administered Medications:     stroke: mapping our early stages of recovery book, , Does not apply, Once, Donnetta Simpers, MD   acetaminophen (TYLENOL) tablet 650 mg, 650 mg, Oral, Q4H PRN **OR** acetaminophen (TYLENOL) 160 MG/5ML solution 650 mg, 650 mg, Per Tube, Q4H PRN **OR** acetaminophen (TYLENOL) suppository 650 mg, 650 mg, Rectal, Q4H PRN, Donnetta Simpers, MD, 650 mg at 06/29/21 0830   aspirin EC tablet 81 mg, 81 mg, Oral, Daily, Rosalin Hawking, MD, 81 mg at 06/30/21 1118   Chlorhexidine Gluconate Cloth 2 % PADS 6 each, 6 each, Topical, Daily, Donnetta Simpers, MD, 6 each at 06/30/21 1042   enoxaparin (LOVENOX) injection 30 mg, 30 mg, Subcutaneous, Q24H, Rosalin Hawking, MD, 30 mg at 06/30/21 1120   MEDLINE mouth rinse, 15 mL, Mouth Rinse, BID, Rosalin Hawking, MD, 15 mL at 06/30/21 2340   metoprolol tartrate (LOPRESSOR) injection 2.5 mg, 2.5 mg, Intravenous, Q6H PRN, Donnetta Simpers, MD   pantoprazole (PROTONIX) EC tablet 40 mg, 40 mg, Oral, Daily, Rosalin Hawking, MD, 40 mg at 06/30/21 1118   senna-docusate (Senokot-S) tablet 1 tablet, 1 tablet, Oral, BID, Donnetta Simpers, MD, 1 tablet at 06/30/21 1118  Patients Current Diet:  Diet Order             DIET DYS 3 Room service appropriate? Yes; Fluid consistency: Thin  Diet effective now                   Precautions / Restrictions Precautions Precautions: Fall Precaution Comments: globally aphasic Restrictions Weight Bearing Restrictions: No   Has the patient had 2 or more falls or a fall with injury in the past year? Yes  Prior Activity Level Limited Community (1-2x/wk): Pt. went out about 1x a week  Prior Functional Level Self Care: Did the patient need help bathing, dressing, using the toilet or eating? Independent  Indoor Mobility: Did the patient need assistance with walking from room to room (with or without device)? Independent  Stairs: Did the patient need assistance with internal or external stairs (with or without device)? Needed some help  Functional Cognition: Did the patient need help planning regular tasks such as shopping or remembering to take medications? Needed some help  Patient Information Are you of Hispanic, Latino/a,or Spanish origin?: A. No, not of Hispanic, Latino/a, or Spanish origin What is your race?: A. White Do you need or want an interpreter to communicate with a doctor or health care staff?: 0. No  Patient's Response To:   Health Literacy and Transportation Is the patient able to respond to health literacy and transportation needs?: No Health Literacy - How often do you need to have someone help you when you read instructions, pamphlets, or other written material from your doctor or pharmacy?: Patient unable to respond In the past 12 months, has lack of transportation kept you from medical appointments or from getting medications?: No In the past 12 months, has lack of transportation kept you from meetings, work, or from getting things needed for daily living?: No  Development worker, international aid / [place]: [place] - single point, Conservation officer, nature (2 wheels)  Prior Device Use: Indicate devices/aids used by the patient prior to current illness, exacerbation or injury? Walker  Current Functional Level Cognition  Overall Cognitive Status: Difficult to assess Orientation Level: Disoriented X4 Following Commands: Follows one  step commands with increased time, Follows one step commands inconsistently (benefits from demonstration, pt did recognize sock and with increased time put on socks at EOB) Safety/Judgement: Decreased awareness of safety, Decreased awareness of deficits General Comments: difficulty to assess due to global aphasia, pt answered "yes" to all questions including "What is your first name?" "i your name Manuela Schwartz?"    Extremity Assessment (includes Sensation/Coordination)  Upper Extremity Assessment: Generalized weakness RUE Deficits / Details: noted mild weakness but using functionally  Lower Extremity Assessment: Defer to PT evaluation RLE Deficits / Details: unable to accurately assess due to pt's global aphasia however noted difficulty clearing R foot during ambulation    ADLs  Overall ADL's : Needs assistance/impaired Eating/Feeding: Minimal assistance Grooming: Moderate assistance Upper Body Bathing: Minimal assistance Lower Body Bathing: Moderate assistance Upper Body Dressing :  Moderate assistance Lower Body Dressing: Moderate assistance, Sit to/from stand Toilet Transfer: Minimal assistance Functional mobility during ADLs: Minimal assistance    Mobility  Overal bed mobility: Needs Assistance Bed Mobility: Sit to Supine Supine to sit: Min guard Sit to supine: Min guard General bed mobility comments: pt brought self up to long sit in bed per family and was trying to get OOB to use bathroom    Transfers  Overall transfer level: Needs assistance Equipment used: 1 person hand held assist Transfers: Sit to/from Stand, Bed to chair/wheelchair/BSC Sit to Stand: Min assist Bed to/from chair/wheelchair/BSC transfer type:: Stand pivot Stand pivot transfers: Mod assist, +2 safety/equipment General transfer comment: max directional verbal and tactile cues to complete transfer to [facility] and then to chair    Ambulation / Gait / Stairs / Wheelchair Mobility  Ambulation/Gait Ambulation/Gait assistance: +2 physical assistance, Mod assist Gait Distance (Feet): 30 Feet (to the door and back) Assistive device: 2 person hand held assist Gait Pattern/deviations: Step-through pattern, Decreased stride length, Decreased dorsiflexion - right, Decreased weight shift to right General Gait Details: pt with decreased R LE step height and length, increased use of R UE to push down into PT compared to L UE per RN who reports she was just holding her hand but pt wasn't pushing down into it Gait velocity: slow Gait velocity interpretation: <1.8 ft/sec, indicate of risk for recurrent falls    Posture / Balance Dynamic Sitting Balance Sitting balance - Comments: pt able to don socks at EOB with min guard assist Balance Overall balance assessment: Needs assistance Sitting-balance support: Feet supported, No upper extremity supported Sitting balance-Leahy Scale: Fair Sitting balance - Comments: pt able to don socks at EOB with min guard assist Standing balance support: Single extremity  supported Standing balance-Leahy Scale: Poor Standing balance comment: requires external support    Special needs/care consideration Skin intact and Special service needs Pt. Globally aphasic   Previous Home Environment (from acute therapy documentation) Living Arrangements: Alone  Lives With: Alone Available Help at Discharge: Family, Available PRN/intermittently Type of Home: House Home Layout: One level Home Access: Stairs to enter Technical brewer of Steps: 2 Bathroom Shower/Tub: Optometrist: Yes How Accessible: Accessible via walker, Accessible via wheelchair [place]: Yes  Discharge Ruth for Discharge Living Setting: Patient's home Type of Home at Discharge: House Discharge Home Layout: One level Discharge Home Access: Stairs to enter Entrance Stairs-Rails: None Entrance Stairs-Number of Steps: 1 Discharge Bathroom Shower/Tub: Tub/shower unit Discharge Bathroom Toilet: Standard Discharge Bathroom Accessibility: Yes How Accessible: Accessible via walker Does the patient have any problems obtaining your medications?: No  Social/Family/Support Systems Patient Roles: Other (Comment) Contact Information: [phone] Anticipated Caregiver: Anureet Bruington Anticipated Caregiver's Contact Information: Pt.s son and daughter to rotate to provide care Ability/Limitations of Caregiver: can provide Mon A Caregiver Availability: 24/7 Discharge Plan Discussed with Primary Caregiver: Yes Is Caregiver In Agreement with Plan?: Yes Does Caregiver/Family have Issues with Lodging/Transportation while Pt is in Rehab?: No  Goals Patient/Family Goal for Rehab: PT/OT Min A, SLP Mod A Expected length of stay: 16-18 days Pt/Family Agrees to Admission and willing to participate: Yes Program Orientation Provided & Reviewed with Pt/Caregiver Including Roles  & Responsibilities: Yes  Decrease burden of Care  through IP rehab admission: Specialzed equipment needs, Diet advancement, Decrease number of caregivers, Bowel and bladder program, and Patient/family education  Possible need for SNF placement upon discharge: not anticipated  Patient Condition: I have reviewed medical records from [facility], spoken with CM, and patient. I met with patient at the bedside for inpatient rehabilitation assessment.  Patient will benefit from ongoing PT, OT, and SLP, can actively participate in 3 hours of therapy a day 5 days of the week, and can make measurable gains during the admission.  Patient will also benefit from the coordinated team approach during an Inpatient Acute Rehabilitation admission.  The patient will receive intensive therapy as well as Rehabilitation physician, nursing, social worker, and care management interventions.  Due to bladder management, bowel management, safety, skin/wound care, disease management, medication administration, pain management, and patient education the patient requires 24 hour a day rehabilitation nursing.  The patient is currently mod+2-max A with mobility and basic ADLs.  Discharge setting and therapy post discharge at home with home health is anticipated.  Patient has agreed to participate in the Acute Inpatient Rehabilitation Program and will admit today.  Preadmission Screen Completed By:  Genella Mech, 07/01/2021 9:22 AM ______________________________________________________________________   Discussed status with Dr. Dagoberto Ligas on 07/01/21 at 83 and received approval for admission today.  Admission Coordinator:  Genella Mech, CCC-SLP, time 6979 /Date 07/01/21   Assessment/Plan: Diagnosis: Does the need for close, 24 hr/day Medical supervision in concert with the patient's rehab needs make it unreasonable for this patient to be served in a less intensive setting? Yes Co-Morbidities requiring supervision/potential complications: global aphasia,  dysphagia; R hemiparesis; new aflutter; prediabetic;  Due to bladder management, bowel management, safety, skin/wound care, disease management, medication administration, pain management, and patient education, does the patient require 24 hr/day rehab nursing? Yes Does the patient require coordinated care of a physician, rehab nurse, PT, OT, and SLP to address physical and functional deficits in the context of the above medical diagnosis(es)? Yes Addressing deficits in the following areas: balance, endurance, locomotion, strength, transferring, bowel/bladder control, bathing, dressing, feeding, grooming, toileting, cognition, speech, language, and swallowing Can the patient actively participate in an intensive therapy program of at least 3 hrs of therapy 5 days a week? Yes The potential for patient to make measurable gains while on inpatient rehab is good and fair Anticipated functional outcomes upon discharge from inpatient rehab: min assist PT, min assist OT, mod assist SLP Estimated rehab length of stay to reach the above functional goals is: 16-18 days Anticipated discharge destination: Home 10. Overall Rehab/Functional Prognosis: good   MD Signature:

## 2021-07-01 NOTE — Progress Notes (Signed)
Physical Therapy Treatment Patient Details Name: Shawna Hopkins MRN: [id number] DOB: September 14, 1927 Today's Date: 07/01/2021   History of Present Illness Pt is a 87yo female you presented to [facility] with global aphasia and R sided weakness. 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 PMH: HTN, GERD, h/o covid with bilat PNA    PT Comments    Pt making progress with functional mobility. She seemingly wants to move and walk with assistance; however, she was limited this session secondary to increased HR (to as high as 160 bpm with activity - RN notified). She continues to demonstrate weakness on her right side and global aphasia but benefits from gestures, demonstrations and tactile cueing. Pt would continue to benefit from skilled physical therapy services at this time while admitted and after d/c to address the below listed limitations in order to improve overall safety and independence with functional mobility.   Recommendations for follow up therapy are one component of a multi-disciplinary discharge planning process, led by the attending physician.  Recommendations may be updated based on patient status, additional functional criteria and insurance authorization.  Follow Up Recommendations  Acute inpatient rehab (3hours/day)     Assistance Recommended at Discharge Frequent or constant Supervision/Assistance  Equipment Recommendations  Other (comment) (defer to next venue of care)    Recommendations for Other Services       Precautions / Restrictions Precautions Precautions: Fall Precaution Comments: globally aphasic Restrictions Weight Bearing Restrictions: No     Mobility  Bed Mobility Overal bed mobility: Needs Assistance Bed Mobility: Supine to Sit;Sit to Supine     Supine to sit: Min guard Sit to supine: Min guard   General bed mobility comments: gestures and tactile cueing to complete bed mobility    Transfers Overall transfer level:  Needs assistance Equipment used: 1 person hand held assist Transfers: Sit to/from Stand Sit to Stand: Min assist           General transfer comment: pt performed sit<>stand x1 from EOB and x1 from toilet with min A for stability    Ambulation/Gait Ambulation/Gait assistance: Mod assist;Min assist Gait Distance (Feet): 30 Feet (15' x2) Assistive device: 1 person hand held assist Gait Pattern/deviations: Step-through pattern;Decreased stride length;Staggering right Gait velocity: decreased     General Gait Details: pt ambulated from the bed to the toilet and back with mod A needed initially with PT providing assistance on her L side. Pt able to progress to needing min A with PT support given on her R side   Stairs             Wheelchair Mobility    Modified Rankin (Stroke Patients Only)       Balance Overall balance assessment: Needs assistance Sitting-balance support: Feet supported;No upper extremity supported Sitting balance-Leahy Scale: Fair Sitting balance - Comments: pt able to sit upright at EOB without UE supports or external assistance needed   Standing balance support: Single extremity supported Standing balance-Leahy Scale: Poor                              Cognition Arousal/Alertness: Awake/alert Behavior During Therapy: Flat affect;Impulsive Overall Cognitive Status: Difficult to assess Area of Impairment: Problem solving;Safety/judgement;Following commands                       Following Commands: Follows one step commands with increased time;Follows one step commands inconsistently Safety/Judgement: Decreased awareness of safety;Decreased  awareness of deficits   Problem Solving: Difficulty sequencing;Requires verbal cues;Requires tactile cues General Comments: difficult to assess due to global aphasia, benefits from gestures        Exercises      General Comments        Pertinent Vitals/Pain Pain Assessment:  Faces Faces Pain Scale: No hurt Pain Intervention(s): Monitored during session    Home Living                          Prior Function            PT Goals (current goals can now be found in the care plan section) Acute Rehab PT Goals PT Goal Formulation: With patient/family Time For Goal Achievement: 07/14/21 Potential to Achieve Goals: Good Progress towards PT goals: Progressing toward goals    Frequency    Min 4X/week      PT Plan Current plan remains appropriate    Co-evaluation              AM-PAC PT "6 Clicks" Mobility   Outcome Measure  Help needed turning from your back to your side while in a flat bed without using bedrails?: None Help needed moving from lying on your back to sitting on the side of a flat bed without using bedrails?: A Little Help needed moving to and from a bed to a chair (including a wheelchair)?: A Lot Help needed standing up from a chair using your arms (e.g., wheelchair or bedside chair)?: A Lot Help needed to walk in hospital room?: A Lot Help needed climbing 3-5 steps with a railing? : A Lot 6 Click Score: 15    End of Session Equipment Utilized During Treatment: Gait belt Activity Tolerance: Patient tolerated treatment well Patient left: in bed;with call bell/phone within reach;with bed alarm set Nurse Communication: Mobility status PT Visit Diagnosis: Unsteadiness on feet (R26.81);History of falling (Z91.81);Difficulty in walking, not elsewhere classified (R26.2)     Time: 0998-3382 PT Time Calculation (min) (ACUTE ONLY): 19 min  Charges:  $Therapeutic Activity: 8-22 mins                     Arletta Bale, DPT  Acute Rehabilitation Services Office [phone]    Alessandra Bevels Elnor Renovato 07/01/2021, 10:38 AM

## 2021-07-01 NOTE — Progress Notes (Addendum)
Inpatient Rehabilitation Admission Medication Review by a Pharmacist  A complete drug regimen review was completed for this patient to identify any potential clinically significant medication issues.  High Risk Drug Classes Is patient taking? Indication by Medication  Antipsychotic No   Anticoagulant Yes Lovenox- VTE prophylaxis  Antibiotic No   Opioid No   Antiplatelet Yes Aspirin- A. Flutter/CVA prevention in the setting of a hemorrhagic transformation of infarct  Hypoglycemics/insulin No   Vasoactive Medication No   Chemotherapy No   Other Yes Protonix- GERD     Type of Medication Issue Identified Description of Issue Recommendation(s)  Drug Interaction(s) (clinically significant)     Duplicate Therapy     Allergy     No Medication Administration End Date     Incorrect Dose     Additional Drug Therapy Needed  Lopressor Patient was started on lopressor on 07/01/2021 by neurology to keep sbp <160 with a goal of normotensive  Significant med changes from prior encounter (inform family/care partners about these prior to discharge).    Other       Clinically significant medication issues were identified that warrant physician communication and completion of prescribed/recommended actions by midnight of the next day:  Yes  Contacted Deatra Ina PA via secure chat   Neurology states that a DOAC may be considered once hemorrhage has resolved on CT.   Time spent performing this drug regimen review (minutes):  30   Jonah Nestle BS, PharmD, BCPS Clinical Pharmacist 07/01/2021 2:58 PM   *Addendum*  Lopressor was ordered on 07/01/2021 at 17:30 by Deatra Ina PA  Greta Doom BS, PharmD, BCPS Clinical Pharmacist 07/02/2021 9:33 AM

## 2021-07-01 NOTE — Progress Notes (Signed)
Speech Language Pathology Treatment: Dysphagia;Cognitive-Linquistic  Patient Details Name: Shawna Hopkins MRN: [id number] DOB: Oct 23, 1927 Today's Date: 07/01/2021 Time: [id number] SLP Time Calculation (min) (ACUTE ONLY): 15 min  Assessment / Plan / Recommendation Clinical Impression  Pt seen with lunch meal. Pt found to have caked residue from breakfast all over her dentures in the right buccal cavity. SLP cleaned all this out and offered bites of different textures on tray. Pt willing to take anything offered, but clearly uninterested in all attempts. Pt had oral residue and pocketing with all solids except puree and thins. No signs of aspiration seen. Granddaughter reports that pt did well when she had puree previously, so will downgrade back to puree and thin and let pt be slowly advanced in inpatient rehab. Pt still not speaking spontaneously but per family she is writing some words. Auditory comprehension is still very poor, with less than 25% accuracy in object and picture identification. Pt to AIR later today.   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  Pt also with h/o Cervical spine C3-C4 and C4-C5 ankylosis per prior imaging. Brain imaging also has shown "Mild chronic small vessel ischemic changes are also present within the pons. Chronic lacunar infarcts within the bilateral basal ganglia., Small chronic infarcts within the bilateral cerebellar hemispheres."      PT remains globally aphasic, family denies pt having issues swallowing from their recollection.      SLP Plan  Continue with current plan of care      Recommendations for follow up therapy are one component of a multi-disciplinary discharge planning process, led by the attending physician.  Recommendations may be updated based on patient status, additional functional criteria and insurance authorization.     Recommendations  Diet recommendations: Dysphagia 1 (puree);Thin liquid Liquids provided via: Cup;Straw Medication Administration: Whole meds with puree Supervision: Patient able to self feed Compensations: Slow rate;Small sips/bites;Lingual sweep for clearance of pocketing Postural Changes and/or Swallow Maneuvers: Seated upright 90 degrees                Oral Care Recommendations: Oral care BID Follow Up Recommendations: Acute inpatient rehab (3hours/day) Assistance recommended at discharge: Frequent or constant Supervision/Assistance SLP Visit Diagnosis: Aphasia (R47.01) Plan: Continue with current plan of care           Makar Slatter, Riley Nearing  07/01/2021, 1:18 PM

## 2021-07-02 LAB — CBC WITH DIFFERENTIAL/PLATELET
Abs Immature Granulocytes: 0.04 10*3/uL (ref 0.00–0.07)
Basophils Absolute: 0 10*3/uL (ref 0.0–0.1)
Basophils Relative: 0 %
Eosinophils Absolute: 0 10*3/uL (ref 0.0–0.5)
Eosinophils Relative: 0 %
HCT: 34.3 % — ABNORMAL LOW (ref 36.0–46.0)
Hemoglobin: 11.5 g/dL — ABNORMAL LOW (ref 12.0–15.0)
Immature Granulocytes: 1 %
Lymphocytes Relative: 7 %
Lymphs Abs: 0.6 10*3/uL — ABNORMAL LOW (ref 0.7–4.0)
MCH: 29 pg (ref 26.0–34.0)
MCHC: 33.5 g/dL (ref 30.0–36.0)
MCV: 86.6 fL (ref 80.0–100.0)
Monocytes Absolute: 0.5 10*3/uL (ref 0.1–1.0)
Monocytes Relative: 6 %
Neutro Abs: 7.2 10*3/uL (ref 1.7–7.7)
Neutrophils Relative %: 86 %
Platelets: 205 10*3/uL (ref 150–400)
RBC: 3.96 MIL/uL (ref 3.87–5.11)
RDW: 11.9 % (ref 11.5–15.5)
WBC: 8.4 10*3/uL (ref 4.0–10.5)
nRBC: 0 % (ref 0.0–0.2)

## 2021-07-02 LAB — URINALYSIS, ROUTINE W REFLEX MICROSCOPIC
Bilirubin Urine: NEGATIVE
Glucose, UA: NEGATIVE mg/dL
Ketones, ur: NEGATIVE mg/dL
Nitrite: POSITIVE — AB
Protein, ur: 100 mg/dL — AB
Specific Gravity, Urine: 1.025 (ref 1.005–1.030)
WBC, UA: >50 WBC/hpf — ABNORMAL HIGH (ref 0–5)
pH: 5 (ref 5.0–8.0)

## 2021-07-02 LAB — COMPREHENSIVE METABOLIC PANEL
ALT: 13 U/L (ref 0–44)
AST: 19 U/L (ref 15–41)
Albumin: 3.1 g/dL — ABNORMAL LOW (ref 3.5–5.0)
Alkaline Phosphatase: 50 U/L (ref 38–126)
Anion gap: 9 (ref 5–15)
BUN: 22 mg/dL (ref 8–23)
CO2: 25 mmol/L (ref 22–32)
Calcium: 8.6 mg/dL — ABNORMAL LOW (ref 8.9–10.3)
Chloride: 104 mmol/L (ref 98–111)
Creatinine, Ser: 1 mg/dL (ref 0.44–1.00)
GFR, Estimated: 53 mL/min — ABNORMAL LOW (ref >60)
Glucose, Bld: 127 mg/dL — ABNORMAL HIGH (ref 70–99)
Potassium: 3 mmol/L — ABNORMAL LOW (ref 3.5–5.1)
Sodium: 138 mmol/L (ref 135–145)
Total Bilirubin: 0.7 mg/dL (ref 0.3–1.2)
Total Protein: 6.2 g/dL — ABNORMAL LOW (ref 6.5–8.1)

## 2021-07-02 MED ORDER — CEPHALEXIN 250 MG PO CAPS
500.0000 mg | ORAL_CAPSULE | Freq: Two times a day (BID) | ORAL | Status: AC
  Administered 2021-07-02 – 2021-07-09 (×14): 500 mg via ORAL
  Filled 2021-07-02 (×14): qty 2

## 2021-07-02 MED ORDER — QUETIAPINE FUMARATE 25 MG PO TABS: 25.0000 mg | ORAL_TABLET | Freq: Every evening | ORAL | Status: DC | PRN

## 2021-07-02 MED ORDER — ALPRAZOLAM 0.5 MG PO TABS
0.5000 mg | ORAL_TABLET | Freq: Three times a day (TID) | ORAL | Status: DC | PRN
  Administered 2021-07-02: 05:00:00 0.5 mg via ORAL
  Filled 2021-07-02: qty 1

## 2021-07-02 MED ORDER — POTASSIUM CHLORIDE CRYS ER 20 MEQ PO TBCR
40.0000 meq | EXTENDED_RELEASE_TABLET | ORAL | Status: AC
  Administered 2021-07-02 (×2): 40 meq via ORAL
  Filled 2021-07-02 (×2): qty 2

## 2021-07-02 NOTE — Patient Care Conference (Signed)
Inpatient RehabilitationTeam Conference and Plan of Care Update Date: 07/02/2021   Time: 10:00 AM    Patient Name: Shawna Hopkins      Medical Record Number: [id number]  Date of Birth: [birth year] Sex: Female         Room/Bed: 4M04C/4M04C-01 Payor Info: Payor: MEDICARE / Plan: MEDICARE PART A AND B / Product Type: *No Product type* /    Admit Date/Time:  07/01/2021  2:40 PM  Primary Diagnosis:  Left middle cerebral artery stroke [facility])  Hospital Problems: Principal Problem:   Left middle cerebral artery stroke [facility])    Expected Discharge Date: Expected Discharge Date:  (evals pending)  Team Members Present: Physician leading conference: Dr. Sula Soda Social Worker Present: Lavera Guise, BSW Nurse Present: Chana Bode, RN PT Present: Grier Rocher, PT OT Present: Annye English, OT SLP Present: Eilene Ghazi, SLP     Current Status/Progress Goal Weekly Team Focus  Bowel/Bladder   pt is incontinent of b and b  decrease incontinet episodes  timed toilette   Swallow/Nutrition/ Hydration   dys 1, thin liquids. max A although difficult to assess due to lethargy  sup A  tolerance of dys 1 diet/thin liquids, advance diet as appropriate   ADL's   eval pending  eval pending  eval pending   Mobility             Communication   global aphasia, max A  min A  multimodal communication, following commands, yes/no responses   Safety/Cognition/ Behavioral Observations            Pain   pt has no current c/o pain         Skin   scattered bruising  remain free from break down and infection  assess q shift and prn     Discharge Planning:  assesment pending   Team Discussion: Patient with acute confusion, agitation, impulsivity and combative with staff last pm. No previous report of behavior. Xanax given ~ 0500; now patient is very lethargic. Repeat UA due to dysuria; r/o UTI.   Patient on target to meet rehab goals: Currently very sleepy. Occasional  unintelligible vocalizations; global aphasia noted. Requires min assist for communication. Goals for discharge set for supervision level.  *See Care Plan and progress notes for long and short-term goals.   Revisions to Treatment Plan:  Downgraded diet to D1 due to pocketing and oral issues Medication for mood/agitation added and discontinued Xanax   Teaching Needs: Safety, medications, transfers, toileting, secondary risk management and dietary modifications, etc.   Current Barriers to Discharge: Decreased caregiver support and New diabetic  Possible Resolutions to Barriers: Family education     Medical Summary Current Status: lethargic now from Xanax given this morning for sundowning, dysuria with foul smelling urine and midly positive UA, dysphagia, hypertension  Barriers to Discharge: New diabetic;Behavior;Medical stability  Barriers to Discharge Comments: lethargic now from Xanax given this morning for sundowning, dysuria with foul smelling urine and midly positive UA, dysphagia, hypertension Possible Resolutions to [employer]: add seroquel at night prn to help with sundowning, repeat UA/UC today, d/c Xanax, continue metoprolol and adjust as needed   Continued Need for Acute Rehabilitation Level of Care: The patient requires daily medical management by a physician with specialized training in physical medicine and rehabilitation for the following reasons: Direction of a multidisciplinary physical rehabilitation program to maximize functional independence : Yes Medical management of patient stability for increased activity during participation in an intensive rehabilitation regime.: Yes Analysis of  laboratory values and/or radiology reports with any subsequent need for medication adjustment and/or medical intervention. : Yes   I attest that I was present, lead the team conference, and concur with the assessment and plan of the team.   Chana Bode B 07/02/2021, 2:37  PM

## 2021-07-02 NOTE — Progress Notes (Addendum)
Patient is impulsive throughout the night. Getting out of bed on multiple occasions without pressing call bell. Pt also combative and hitting and scratching staff when assisting to the bathroom. Refuses to wear gait belt to ambulate. Seems to be comfortable in chair at this time. Safety belt and no slip socks on. Call bell in reach. No other concerns.

## 2021-07-02 NOTE — Plan of Care (Signed)
°  Problem: RH Balance Goal: LTG: Patient will maintain dynamic sitting balance (OT) Description: LTG:  Patient will maintain dynamic sitting balance with assistance during activities of daily living (OT) Flowsheets (Taken 07/02/2021 1013) LTG: Pt will maintain dynamic sitting balance during ADLs with: Supervision/Verbal cueing Goal: LTG Patient will maintain dynamic standing with ADLs (OT) Description: LTG:  Patient will maintain dynamic standing balance with assist during activities of daily living (OT)  Flowsheets (Taken 07/02/2021 1013) LTG: Pt will maintain dynamic standing balance during ADLs with: Supervision/Verbal cueing   Problem: Sit to Stand Goal: LTG:  Patient will perform sit to stand in prep for activites of daily living with assistance level (OT) Description: LTG:  Patient will perform sit to stand in prep for activites of daily living with assistance level (OT) Flowsheets (Taken 07/02/2021 1013) LTG: PT will perform sit to stand in prep for activites of daily living with assistance level: Supervision/Verbal cueing   Problem: RH Eating Goal: LTG Patient will perform eating w/assist, cues/equip (OT) Description: LTG: Patient will perform eating with assist, with/without cues using equipment (OT) Flowsheets (Taken 07/02/2021 1013) LTG: Pt will perform eating with assistance level of: Supervision/Verbal cueing   Problem: RH Grooming Goal: LTG Patient will perform grooming w/assist,cues/equip (OT) Description: LTG: Patient will perform grooming with assist, with/without cues using equipment (OT) Flowsheets (Taken 07/02/2021 1013) LTG: Pt will perform grooming with assistance level of: Supervision/Verbal cueing   Problem: RH Bathing Goal: LTG Patient will bathe all body parts with assist levels (OT) Description: LTG: Patient will bathe all body parts with assist levels (OT) Flowsheets (Taken 07/02/2021 1013) LTG: Pt will perform bathing with assistance level/cueing:  Supervision/Verbal cueing   Problem: RH Dressing Goal: LTG Patient will perform upper body dressing (OT) Description: LTG Patient will perform upper body dressing with assist, with/without cues (OT). Flowsheets (Taken 07/02/2021 1013) LTG: Pt will perform upper body dressing with assistance level of: Supervision/Verbal cueing Goal: LTG Patient will perform lower body dressing w/assist (OT) Description: LTG: Patient will perform lower body dressing with assist, with/without cues in positioning using equipment (OT) Flowsheets (Taken 07/02/2021 1013) LTG: Pt will perform lower body dressing with assistance level of: Supervision/Verbal cueing   Problem: RH Toileting Goal: LTG Patient will perform toileting task (3/3 steps) with assistance level (OT) Description: LTG: Patient will perform toileting task (3/3 steps) with assistance level (OT)  Flowsheets (Taken 07/02/2021 1013) LTG: Pt will perform toileting task (3/3 steps) with assistance level: Supervision/Verbal cueing   Problem: RH Functional Use of Upper Extremity Goal: LTG Patient will use RT/LT upper extremity as a (OT) Description: LTG: Patient will use right/left upper extremity as a stabilizer/gross assist/diminished/nondominant/dominant level with assist, with/without cues during functional activity (OT) Flowsheets (Taken 07/02/2021 1013) LTG: Use of upper extremity in functional activities: RUE as dominant level LTG: Pt will use upper extremity in functional activity with assistance level of: Supervision/Verbal cueing   Problem: RH Awareness Goal: LTG: Patient will demonstrate awareness during functional activites type of (OT) Description: LTG: Patient will demonstrate awareness during functional activites type of (OT) Flowsheets (Taken 07/02/2021 1013) Patient will demonstrate awareness during functional activites type of: Intellectual LTG: Patient will demonstrate awareness during functional activites type of (OT): Moderate  Assistance - Patient 50 - 74%

## 2021-07-02 NOTE — Progress Notes (Signed)
Inpatient Rehabilitation  Patient information reviewed and entered into eRehab system by Raedyn Wenke Harjas Biggins, OTR/L.   Information including medical coding, functional ability and quality indicators will be reviewed and updated through discharge.    

## 2021-07-02 NOTE — Evaluation (Addendum)
Occupational Therapy Assessment and Plan  Patient Details  Name: Shawna Hopkins MRN: [id number] Date of Birth: [birth year]  OT Diagnosis: abnormal posture, altered mental status, cognitive deficits, hemiplegia affecting dominant side, muscle weakness (generalized), and decreased activity tolerance, decreased alertness Rehab Potential: Rehab Potential (ACUTE ONLY): Fair ELOS: 2-2.5 weeks   Today's Date: 07/02/2021 OT Co-Treatment Time: 0921-0940 OT Co-Treatment Time Calculation (min): 19 min and Today's Date: 07/02/2021 OT Missed Time: 40 Minutes Missed Time Reason: Patient fatigue    Hospital Problem: Principal Problem:   Left middle cerebral artery stroke [facility])   Past Medical History:  Past Medical History:  Diagnosis Date   Hypertension    Past Surgical History:  Past Surgical History:  Procedure Laterality Date   ABDOMINAL SURGERY     Galbladder    Assessment & Plan Clinical Impression: Patient is a 87 y.o. year old female with history of hypertension as well as GERD.  Per chart review patient lives alone.  1 level home 2 steps to entry.  Daughter reports patient independent prior to admission.  Family does her grocery shopping and takes care providing transportation to any appointments.  Presented to [facility]/2021 with right side weakness and global aphasia.  CT/MRI showed multiple acute cortical subcortical left MCA territory infarcts within the left frontal parietal and temporal lobes.  The largest infarct within the mid to posterior left frontal lobe, left frontal operculum and left insula measuring 2.8 x 2.6 x 4.4 cm.  SWI signal loss present at site of dominant infarct compatible with petechial hemorrhage/hemorrhagic conversion.  Only mild mass-effect noted.  CT angiogram head and neck occlusion of distal M2/proximal M3 left MCA vessel.  Patient did not receive tPA.  EKG did show new onset atrial fibrillation with rate controlled.  She was transferred to [facility].  Echocardiogram with ejection fraction of 60 to 65% no wall motion abnormalities.  Follow-up cranial CT scan 06/29/2021 showed no significant change.  No new intracranial abnormality.  Incidental finding of 6 x 6 mm aneurysm projecting from distal cavernous/paraclinoid left ICA discussed with interventional radiology follow-up outpatient.  She was cleared to begin aspirin 81 mg daily considering DOAC once hemorrhage resolved with follow-up cranial CT.  She was cleared to begin Lovenox for DVT prophylaxis 06/30/2021.  Presently on a mechanical soft diet with thin liquids.  Therapy evaluations completed due to patient's aphasia as well as right-sided weakness was admitted for a comprehensive rehab program..  Patient transferred to CIR on 07/01/2021 .    Patient currently requires  total A of 2  with basic self-care skills secondary to muscle weakness, decreased cardiorespiratoy endurance, impaired timing and sequencing, unbalanced muscle activation, decreased coordination, and decreased motor planning, decreased midline orientation and decreased motor planning, decreased initiation, decreased attention, decreased awareness, decreased problem solving, decreased safety awareness, decreased memory, and delayed processing, and decreased sitting balance, decreased standing balance, decreased postural control, and decreased balance strategies.  Prior to hospitalization, patient could complete BADL with  mod I .  Patient will benefit from skilled intervention to decrease level of assist with basic self-care skills and increase independence with basic self-care skills prior to discharge  home; will need to verify level of assist available upon DC .  Anticipate patient will require 24 hour supervision and follow up home health.  OT - End of Session Activity Tolerance: Tolerates < 10 min activity, no significant change in vital signs Endurance Deficit: Yes OT Assessment Rehab Potential (ACUTE ONLY): Fair OT  Barriers  to Discharge: Inaccessible home environment;Decreased caregiver support;Behavior OT Patient demonstrates impairments in the following area(s): Balance;Sensory;Skin Integrity;Behavior;Cognition;Endurance;Motor;Perception;Safety OT Basic ADL's Functional Problem(s): Eating;Grooming;Bathing;Dressing;Toileting OT Transfers Functional Problem(s): Tub/Shower;Toilet OT Additional Impairment(s): Fuctional Use of Upper Extremity OT Plan OT Intensity: Minimum of 1-2 x/day, 45 to 90 minutes OT Frequency: 5 out of 7 days OT Duration/Estimated Length of Stay: 2-2.5 weeks OT Treatment/Interventions: Balance/vestibular training;Disease mangement/prevention;Neuromuscular re-education;Self Care/advanced ADL retraining;Therapeutic Exercise;Wheelchair propulsion/positioning;UE/LE Strength taining/ROM;Skin care/wound managment;Pain management;DME/adaptive equipment instruction;Cognitive remediation/compensation;Community reintegration;Functional electrical stimulation;UE/LE Coordination activities;Splinting/orthotics;Patient/family education;Discharge planning;Functional mobility training;Psychosocial support;Therapeutic Activities;Visual/perceptual remediation/compensation OT Self Feeding Anticipated Outcome(s): S OT Basic Self-Care Anticipated Outcome(s): S OT Toileting Anticipated Outcome(s): S OT Bathroom Transfers Anticipated Outcome(s): S OT Recommendation Patient destination: Home Follow Up Recommendations: Home health OT;24 hour supervision/assistance Equipment Recommended: To be determined   OT Evaluation Precautions/Restrictions  Precautions Precaution Comments: globally aphasic, HOB > 30 degrees Restrictions Weight Bearing Restrictions: No General OT Amount of Missed Time: 40 Minutes PT Missed Treatment Reason: Patient fatigue Response to Previous Treatment: Not applicable Family/Caregiver Present: No  Pain Pain Assessment Pain Scale: Faces Pain Score: 0-No pain Home Living/Prior  Functioning Home Living Family/patient expects to be discharged to:: Private residence Living Arrangements: Alone Available Help at Discharge: Family, Available PRN/intermittently Type of Home: House Home Access: Stairs to enter Technical brewer of Steps: 2 Home Layout: One level Bathroom Shower/Tub: Optometrist: Yes  Lives With: Alone IADL History Homemaking Responsibilities: Yes Meal Prep Responsibility: Primary Laundry Responsibility: Primary Cleaning Responsibility: Primary Bill Paying/Finance Responsibility: Primary Shopping Responsibility: Secondary Occupation: Retired Prior Function Level of Independence: Independent with basic ADLs, Independent with gait, Independent with transfers, Independent with homemaking with ambulation Driving: No Vision Baseline Vision/History: 1 Wears glasses Ability to See in Adequate Light: 0 Adequate (unable to fully assess due to Mood[place]) Patient Visual Report: Other (comment) Vision Assessment?: Vision impaired- to be further tested in functional context Perception  Perception: Impaired Comments: unable to fully assess due to global aphasia/lethargy Praxis Praxis: Impaired Praxis Impairment Details: Motor planning;Initiation Praxis-Other Comments: unable to fully assess due to global aphasia/lethargy Cognition Overall Cognitive Status: Impaired/Different from baseline Arousal/Alertness: Lethargic Orientation Level: Nonverbal/unable to assess Year:  (unable to state due to global aphasia/lethargy) Month:  (unable to state due to global aphasia/lethargy) Day of Week:  (unable to state due to global aphasia/lethargy) Memory:  (unable to fully assess due to global aphasia/lethargy) Immediate Memory Recall:  (unable to state due to global aphasia/lethargy) Memory Recall Sock:  (unable to state due to global aphasia/lethargy) Memory Recall Blue:  (unable to state  due to global aphasia/lethargy) Memory Recall Bed:  (unable to state due to global aphasia/lethargy) Attention: Focused Focused Attention: Impaired Focused Attention Impairment: Verbal basic;Functional basic Awareness: Impaired Awareness Impairment: Intellectual impairment Problem Solving: Impaired Problem Solving Impairment: Verbal basic;Functional basic Safety/Judgment: Impaired Comments: unable to fully assess due to global aphasia/lethargy Sensation Sensation Light Touch:  (unable to fully assess due to global aphasia/lethargy) Hot/Cold:  (unable to fully assess due to global aphasia/lethargy) Proprioception:  (unable to fully assess due to global aphasia/lethargy) Stereognosis:  (unable to fully assess due to global aphasia/lethargy) Coordination Gross Motor Movements are Fluid and Coordinated: No Fine Motor Movements are Fluid and Coordinated: No Coordination and Movement Description: poor postural control sitting EOB with mild L / anterior lean Motor  Motor Motor: Other (comment);Hemiplegia Motor - Skilled Clinical Observations: mild R hemi, L/anterior lean seated EOB  Trunk/Postural Assessment  Cervical Assessment Cervical Assessment: Exceptions to [facility] (forward head)  Thoracic Assessment Thoracic Assessment: Exceptions to [facility] (rounded shoulders) Lumbar Assessment Lumbar Assessment: Exceptions to [facility] (posterior pelvic tilt) Postural Control Postural Control: Deficits on evaluation (L/anterior lean seated EOB)  Balance Balance Balance Assessed: Yes Static Sitting Balance Static Sitting - Balance Support: Feet supported Static Sitting - Level of Assistance: 4: Min assist;3: Mod assist Static Standing Balance Static Standing - Balance Support: Bilateral upper extremity supported;During functional activity Static Standing - Level of Assistance: 1: +2 Total assist Dynamic Standing Balance Dynamic Standing - Balance Support: During functional activity;Bilateral upper  extremity supported Dynamic Standing - Level of Assistance: 1: +2 Total assist Extremity/Trunk Assessment RUE Assessment RUE Assessment: Exceptions to [facility] General Strength Comments: diffuclty fully assessing due to fatigue, able to graps pants to assist in pulling up, but poor attention to RUE during UB dressing; mild R hemi per chart review LUE Assessment LUE Assessment: Within Functional Limits General Strength Comments: diffuclty fully assessing due to fatigue, able to graps pants to assist in pulling up  [place] Eating Eating activity did not occur: Safety/medical concerns      Oral Care    Oral Care Assist Level: Dependent - Patient 0%)    Bathing     Body parts bathed by helper: Face;Right arm;Left arm;Chest;Abdomen;Front perineal area;Buttocks;Right upper leg;Left upper leg;Right lower leg;Left lower leg   Assist Level: 2 Helpers    Upper Body Dressing(including orthotics)   What is the patient wearing?: Pull over shirt;Hospital gown only;Dress   Assist Level: Total Assistance - Patient < 25%    Lower Body Dressing (excluding footwear)   What is the patient wearing?: Pants Assist for lower body dressing: 2 Helpers    Putting on/Taking off footwear   What is the patient wearing?: Non-skid slipper socks Assist for footwear: Dependent - Patient 0%       Care Tool Toileting Toileting activity   Assist for toileting: 2 Helpers     Care Tool Bed Mobility Roll left and right activity        Sit to lying activity   Sit to lying assist level: 2 Helpers    Lying to sitting on side of bed activity   Lying to sitting on side of bed assist level: the ability to move from lying on the back to sitting on the side of the bed with no back support.: 2 Helpers     Care Tool Transfers Sit to stand transfer   Sit to stand assist level: 2 Helpers    Chair/bed transfer Chair/bed transfer activity did not occur: Safety/medical concerns       Toilet  transfer Toilet transfer activity did not occur: Safety/medical concerns       Care Tool Cognition  Expression of Ideas and Wants Expression of Ideas and Wants: 1. Rarely/Never expressess or very difficult - rarely/never expresses self or speech is very difficult to understand  Understanding Verbal and Non-Verbal Content Understanding Verbal and Non-Verbal Content: 1. Rarely/never understands   Memory/Recall Ability Memory/Recall Ability : None of the above were recalled   Refer to Care Plan for Long Term Goals  SHORT TERM GOAL WEEK 1 OT Short Term Goal 1 (Week 1): Pt will don shirt with mod A. OT Short Term Goal 2 (Week 1): Pt will complete toilet transfer with mod A. OT Short Term Goal 3 (Week 1): Pt will complete grooming routine at sink with min A. OT Short Term Goal 4 (Week 1): Pt will don pants with max A.  Recommendations for other services: None    Skilled Therapeutic Intervention ADL ADL Eating: Unable to assess Where Assessed-Eating: Edge of bed Grooming: Dependent Where Assessed-Grooming: Edge of bed Upper Body Bathing: Dependent Where Assessed-Upper Body Bathing: Edge of bed Lower Body Bathing: Dependent Where Assessed-Lower Body Bathing: Edge of bed Upper Body Dressing: Dependent Where Assessed-Upper Body Dressing: Edge of bed Lower Body Dressing: Dependent Where Assessed-Lower Body Dressing: Edge of bed Toileting: Dependent Where Assessed-Toileting: Bed level Toilet Transfer: Unable to assess Tub/Shower Transfer: Not assessed Social research officer, government: Not assessed Mobility  Bed Mobility Bed Mobility: Sit to Supine;Supine to Sit Supine to Sit: 2 Helpers Sit to Supine: 2 Helpers Transfers Sit to Stand: 2 Helpers Stand to Sit: 2 Helpers Session Note: Attempted to see pt for scheduled OT eval. Unable to rouse despite max tactile and verbal cuing and environmental adjustments (elevating HOB, turning on lights, cold wash cloth to face, etc.).   Returned 1  hour later to co - eval with SLP. Pt received semi-reclined in bed. Did not open eyes throughout session, but inconsistently shaking head yes / no. Total A + 2 to sit EOB, but able to sit EOB with intermittent min A of 1. Total + 2 to don pants, pt able to minimally assist when hands placed on pants and pt able to pull up past knees. Total A of 1 to doff/don shirt. Completed sit to stand x3 with mod of 2 to power up and to scoot up in bed. Returned to supine with total A of 2.  Pt left with HOB at 30 degrees with bed alarm engaged, call bell in reach, and all immediate needs met.   Discharge Criteria: Patient will be discharged from OT if patient refuses treatment 3 consecutive times without medical reason, if treatment goals not met, if there is a change in medical status, if patient makes no progress towards goals or if patient is discharged from hospital.  The above assessment, treatment plan, treatment alternatives and goals were discussed and mutually agreed upon: No family available/patient unable  Volanda Napoleon MS, OTR/L  07/02/2021, 1:00 PM

## 2021-07-02 NOTE — Progress Notes (Signed)
Patient ID: Shawna Hopkins, female   DOB: [birth year], 87 y.o.   MRN: [id number] Attempted to see patient  however very sleepy post medication early AM. Nursing staff reported patient did not sleep well; after 7p noted increased confusion, agitation, impulsivity, restlessness and became combative with staff. Patient is continent of bowel and bladder with dysuria. Information on medications, secondary risks including HTN, HLD and A-fib with prediabetes left at the bedside for family along with tips for increased calcium foods and dietary management tips for constipation. Continue to follow along to discharge to address educational needs to facilitate preparation for discharge. Pamelia Hoit

## 2021-07-02 NOTE — Plan of Care (Signed)
°  Problem: RH Swallowing Goal: LTG Patient will consume least restrictive diet using compensatory strategies with assistance (SLP) Description: LTG:  Patient will consume least restrictive diet using compensatory strategies with assistance (SLP) Flowsheets (Taken 07/02/2021 1746) LTG: Pt Patient will consume least restrictive diet using compensatory strategies with assistance of (SLP): Supervision Goal: LTG Pt will demonstrate functional change in swallow as evidenced by bedside/clinical objective assessment (SLP) Description: LTG: Patient will demonstrate functional change in swallow as evidenced by bedside/clinical objective assessment (SLP) Flowsheets (Taken 07/02/2021 1746) LTG: Patient will demonstrate functional change in swallow as evidenced by bedside/clinical objective assessment: Oral swallow   Problem: RH Comprehension Communication Goal: LTG Patient will comprehend basic/complex auditory (SLP) Description: LTG: Patient will comprehend basic/complex auditory information with cues (SLP). Flowsheets (Taken 07/02/2021 1746) LTG: Patient will comprehend auditory information with cueing (SLP): Minimal Assistance - Patient > 75%   Problem: RH Expression Communication Goal: LTG Patient will express needs/wants via multi-modal(SLP) Description: LTG:  Patient will express needs/wants via multi-modal communication (gestures/written, etc) with cues (SLP) Flowsheets (Taken 07/02/2021 1746) LTG: Patient will express needs/wants via multimodal communication (gestures/written, etc) with cueing (SLP): Minimal Assistance - Patient > 75%   Problem: RH Attention Goal: LTG Patient will demonstrate this level of attention during functional activites (SLP) Description: LTG:  Patient will will demonstrate this level of attention during functional activites (SLP) Flowsheets (Taken 07/02/2021 1746) LTG: Patient will demonstrate this level of attention during cognitive/linguistic activities with  assistance of (SLP): Minimal Assistance - Patient > 75%

## 2021-07-02 NOTE — Evaluation (Signed)
Physical Therapy Assessment and Plan  Patient Details  Name: Shawna Hopkins MRN: [id number] Date of Birth: 03-15-1928  PT Diagnosis: Abnormality of gait, Ataxia, Coordination disorder, Hemiplegia dominant, Impaired cognition, and Muscle weakness Rehab Potential: Good ELOS: 10-14 days   Today's Date: 07/02/2021 PT Individual Time: 5462-7035 PT Individual Time Calculation (min): 38 min    Hospital Problem: Principal Problem:   Left middle cerebral artery stroke [facility])   Past Medical History:  Past Medical History:  Diagnosis Date   Hypertension    Past Surgical History:  Past Surgical History:  Procedure Laterality Date   ABDOMINAL SURGERY     Galbladder    Assessment & Plan Clinical Impression: Patient is a 87 year old right-handed female with history of hypertension as well as GERD.  Per chart review patient lives alone.  1 level home 2 steps to entry.  Daughter reports patient independent prior to admission.  Family does her grocery shopping and takes care providing transportation to any appointments.  Presented to [facility]/28/2021 with right side weakness and global aphasia.  CT/MRI showed multiple acute cortical subcortical left MCA territory infarcts within the left frontal parietal and temporal lobes.  The largest infarct within the mid to posterior left frontal lobe, left frontal operculum and left insula measuring 2.8 x 2.6 x 4.4 cm.  SWI signal loss present at site of dominant infarct compatible with petechial hemorrhage/hemorrhagic conversion.  Only mild mass-effect noted.  CT angiogram head and neck occlusion of distal M2/proximal M3 left MCA vessel.  Patient did not receive tPA.  EKG did show new onset atrial fibrillation with rate controlled.  She was transferred to [facility].  Echocardiogram with ejection fraction of 60 to 65% no wall motion abnormalities.  Follow-up cranial CT scan 06/29/2021 showed no significant change.  No new intracranial abnormality.   Incidental finding of 6 x 6 mm aneurysm projecting from distal cavernous/paraclinoid left ICA discussed with interventional radiology follow-up outpatient.  She was cleared to begin aspirin 81 mg daily considering DOAC once hemorrhage resolved with follow-up cranial CT  Patient transferred to CIR on 07/01/2021 .   Patient currently requires min with mobility secondary to muscle weakness, decreased cardiorespiratoy endurance, motor apraxia and decreased coordination, decreased attention to right, decreased initiation, decreased attention, decreased awareness, decreased problem solving, decreased safety awareness, decreased memory, and delayed processing, and decreased sitting balance, decreased standing balance, decreased postural control, hemiplegia, and decreased balance strategies.  Prior to hospitalization, patient was modified independent  with mobility and lived with Alone in a House home.  Home access is 2Stairs to enter.  Patient will benefit from skilled PT intervention to maximize safe functional mobility, minimize fall risk, and decrease caregiver burden for planned discharge home with 24 hour supervision.  Anticipate patient will benefit from follow up [place] at discharge.  PT - End of Session Activity Tolerance: Tolerates < 10 min activity, no significant change in vital signs Endurance Deficit: Yes PT Assessment Rehab Potential (ACUTE/IP ONLY): Good PT Barriers to Discharge: [place] home environment;Decreased caregiver support;Home environment access/layout;Medication compliance;Lack of/limited family support;Behavior PT Patient demonstrates impairments in the following area(s): Balance;Behavior;Endurance;Motor;Nutrition;Perception;Safety;Sensory;Skin Integrity PT Transfers Functional Problem(s): Bed Mobility;Bed to Chair;Car;Furniture;Floor PT Locomotion Functional Problem(s): Wheelchair Mobility;Stairs;Ambulation PT Plan PT Intensity: Minimum of 1-2 x/day ,45 to 90 minutes PT  Frequency: 5 out of 7 days PT Duration Estimated Length of Stay: 10-14 days PT Treatment/Interventions: Ambulation/gait training;Balance/vestibular training;Cognitive remediation/compensation;Discharge planning;Community reintegration;DME/adaptive equipment instruction;Functional mobility training;Disease management/prevention;Patient/family education;Pain management;Neuromuscular re-education;Psychosocial support;Functional electrical stimulation;Splinting/orthotics;Therapeutic Exercise;Skin care/wound management;Therapeutic  Activities;Stair training;UE/LE Strength taining/ROM;UE/LE Coordination activities;Visual/perceptual remediation/compensation;Wheelchair propulsion/positioning PT Transfers Anticipated Outcome(s): Supevrision assist with LRAD PT Locomotion Anticipated Outcome(s): Supevision assist with LRAD ambulatory PT Recommendation Recommendations for Other Services: Therapeutic Recreation consult Therapeutic Recreation Interventions: Outing/community reintergration;Stress management Follow Up Recommendations: Home health PT Patient destination: Home Equipment Recommended: Rolling walker with 5" wheels;To be determined   PT Evaluation Precautions/Restrictions Precautions Precautions: Fall Precaution Comments: globally aphasic General   Vital SignsTherapy Vitals Temp: 98.1 F (36.7 C) Temp Source: Oral Pulse Rate: 90 Resp: 18 BP: (!) 178/100 Patient Position (if appropriate): Lying Oxygen Therapy SpO2: 97 % O2 Device: Room Air Missed time. 52 min. Pt fatigue.  Pain Pain Assessment Pain Scale: Faces Faces Pain Scale: No hurt Pain Interference Pain Interference Pain Effect on Sleep: 8. Unable to answer Pain Interference with Therapy Activities: 8. Unable to answer Pain Interference with Day-to-Day Activities: 8. Unable to answer Home Living/Prior Functioning Home Living Available Help at Discharge: Family;Available PRN/intermittently Type of Home: House Home Access:  Stairs to enter [employer] of Steps: 2 Entrance Stairs-Rails: None Home Layout: One level Bathroom Shower/Tub: Chiropodist: Standard Bathroom Accessibility: Yes  Lives With: Alone Prior Function Level of Independence: Independent with basic ADLs;Independent with gait;Independent with transfers;Independent with homemaking with ambulation  Able to Take Stairs?: Yes Driving: No Vocation: Retired Art gallery manager: Impaired Praxis Praxis: Impaired Praxis Impairment Details: Motor planning;Initiation  Cognition Overall Cognitive Status: Impaired/Different from baseline Arousal/Alertness: Awake/alert Year:  (unable due to global aphasia) Month:  (unable due to global aphasia) Day of Week:  (unable due to global aphasia) Attention: Focused Focused Attention: Impaired Focused Attention Impairment: Verbal basic;Functional basic Memory:  (unable due to global aphasia/lethargy) Awareness: Impaired Awareness Impairment: Intellectual impairment Problem Solving: Impaired Problem Solving Impairment: Verbal basic;Functional basic Safety/Judgment: Impaired Comments: unable to fully assess due to global aphasia/lethargy Sensation Sensation Additional Comments: mild dysmetria. unable to follow commands for formal sensory testig Coordination Gross Motor Movements are Fluid and Coordinated: No Fine Motor Movements are Fluid and Coordinated: No Coordination and Movement Description: mild dysmetria o nthe RLE with gait. Motor  Motor Motor: Other (comment);Hemiplegia Motor - Skilled Clinical Observations: mild R hemi, L/anterior lean seated EOB   Trunk/Postural Assessment  Cervical Assessment Cervical Assessment: Exceptions to [facility] (forward head) Thoracic Assessment Thoracic Assessment: Exceptions to [facility] (rounded shoulders) Lumbar Assessment Lumbar Assessment: Exceptions to [facility] (posterior pelvic tilt) Postural Control Postural  Control: Deficits on evaluation (L/anterior lean seated EOB)  Balance Balance Balance Assessed: Yes Static Sitting Balance Static Sitting - Balance Support: Feet supported Static Sitting - Level of Assistance: 5: Stand by assistance Dynamic Sitting Balance Dynamic Sitting - Balance Support: Feet unsupported Dynamic Sitting - Level of Assistance: 5: Stand by assistance;4: Min assist Static Standing Balance Static Standing - Balance Support: Bilateral upper extremity supported Static Standing - Level of Assistance: 4: Min assist Dynamic Standing Balance Dynamic Standing - Balance Support: Bilateral upper extremity supported Dynamic Standing - Level of Assistance: 4: Min assist Extremity Assessment      RLE Assessment RLE Assessment: Exceptions to [facility] General Strength Comments: grossly 4/5 through functional movement LLE Assessment LLE Assessment: Within Functional Limits General Strength Comments: grossly 4+/5 through funcitonal movement  Care Tool Care Tool Bed Mobility Roll left and right activity   Roll left and right assist level: Supervision/Verbal cueing    Sit to lying activity   Sit to lying assist level: Supervision/Verbal cueing    Lying to sitting on side of bed activity  Lying to sitting on side of bed assist level: the ability to move from lying on the back to sitting on the side of the bed with no back support.: Supervision/Verbal cueing     Care Tool Transfers Sit to stand transfer   Sit to stand assist level: Minimal Assistance - Patient > 75%    Chair/bed transfer   Chair/bed transfer assist level: Minimal Assistance - Patient > 75%     Toilet transfer   Assist Level: Minimal Assistance - Patient > 75%    Car transfer Car transfer activity did not occur: Refused        Care Tool Locomotion Ambulation   Assist level: Minimal Assistance - Patient > 75% Assistive device: Walker-rolling Max distance: 20  Walk 10 feet activity   Assist level:  Minimal Assistance - Patient > 75%     Walk 50 feet with 2 turns activity Walk 50 feet with 2 turns activity did not occur: Refused      Walk 150 feet activity Walk 150 feet activity did not occur: Refused      Walk 10 feet on uneven surfaces activity Walk 10 feet on uneven surfaces activity did not occur: Refused      Stairs Stair activity did not occur: Refused        Walk up/down 1 step activity Walk up/down 1 step or curb (drop down) activity did not occur: Refused      Walk up/down 4 steps activity Walk up/down 4 steps activity did not occur: Refused      Walk up/down 12 steps activity Walk up/down 12 steps activity did not occur: Refused      Pick up small objects from floor Pick up small object from the floor (from standing position) activity did not occur: Refused      Wheelchair Is the patient using a wheelchair?: Yes Type of Wheelchair: Manual Wheelchair activity did not occur: Refused      Wheel 50 feet with 2 turns activity Wheelchair 50 feet with 2 turns activity did not occur: Refused    Wheel 150 feet activity Wheelchair 150 feet activity did not occur: Refused      Refer to Care Plan for Long Term Goals  SHORT TERM GOAL WEEK 1 PT Short Term Goal 1 (Week 1): Pt will ambulate with RW >162f with min assist PT Short Term Goal 2 (Week 1): [facility] Short Term Goal 3 (Week 1): Pt will ascend 4 steps with BUE support and min assist  Recommendations for other services: None   Skilled Therapeutic Intervention Mobility Bed Mobility Bed Mobility: Rolling Right;Rolling Left;Supine to Sit;Sit to Supine Rolling Right: Supervision/verbal cueing Rolling Left: Supervision/Verbal cueing Supine to Sit: Supervision/Verbal cueing Sit to Supine: Supervision/Verbal cueing Transfers Transfers: Sit to Stand;Stand Pivot Transfers;Stand to Sit Sit to Stand: Minimal Assistance - Patient > 75% Stand to Sit: Minimal Assistance -  Patient > 75% Stand Pivot Transfers: Minimal Assistance - Patient > 75% Stand Pivot Transfer Details: Verbal cues for sequencing;Verbal cues for technique;Verbal cues for precautions/safety;Verbal cues for gait pattern;Verbal cues for safe use of DME/AE Transfer (Assistive device): Rolling walker Locomotion  Gait Ambulation: Yes Gait Assistance: Minimal Assistance - Patient > 75% Gait Distance (Feet): 20 Feet Assistive device: Rolling walker Gait Assistance Details: Verbal cues for gait pattern;Verbal cues for safe use of DME/AE;Verbal cues for precautions/safety Gait Gait: Yes Gait Pattern: Impaired Gait Pattern: Ataxic Stairs / Additional Locomotion Stairs: No  Wheelchair Mobility Wheelchair Mobility: No (declines movement out of room)   Session 1.  Pt received supine in bed and agreeable to PT. PT instructed patient in PT Evaluation and initiated treatment intervention; see above for results. PT educated patient in [place], rehab potential, rehab goals, and discharge recommendations along with recommendation for follow-up rehabilitation services.  Supine>sit transfer with supervision assist and cues for safety and use of bed rails as needed. Ambulatory transfer to bathroom with RW and min assist for safety. Mild dysmetria on the RLE with gait. Pt able to perform all clothing mnagmenet and pericare with supervision assist. Pt returned to bed and closing eyes once reclined in bed. Left with granddaughter in room and all needs ment.   Session 2.  Pt received supine in bed asleep. Pt unable to be aroused thorugh verbal and physical stimuli. Pt will re-attempt at later time/date.      Discharge Criteria: Patient will be discharged from PT if patient refuses treatment 3 consecutive times without medical reason, if treatment goals not met, if there is a change in medical status, if patient makes no progress towards goals or if patient is discharged from hospital.  The above assessment,  treatment plan, treatment alternatives and goals were discussed and mutually agreed upon: by patient  Lorie Phenix 07/03/2021, 7:36 AM

## 2021-07-02 NOTE — Progress Notes (Signed)
PROGRESS NOTE   Subjective/Complaints: Drowsy this morning, unable to tolerate therapy, likely because she received Xanax this morning. She never took Xanax at home- I have d/ced it. Discussed with family and team that I have added small dose prn Seroquel for sundowning at night, likely related to UTI  ROS: +somnolence  Objective:   No results found. Recent Labs    07/01/21 0244 07/02/21 0658  WBC 10.0 8.4  HGB 11.5* 11.5*  HCT 35.1* 34.3*  PLT 202 205   Recent Labs    07/01/21 0244 07/02/21 0658  NA 140 138  K 3.4* 3.0*  CL 105 104  CO2 27 25  GLUCOSE 130* 127*  BUN 23 22  CREATININE 1.00 1.00  CALCIUM 8.7* 8.6*    Intake/Output Summary (Last 24 hours) at 07/02/2021 1505 Last data filed at 07/01/2021 1832 Gross per 24 hour  Intake 40 ml  Output --  Net 40 ml        Physical Exam: Vital Signs Blood pressure (!) 184/100, pulse 82, temperature (!) 97.5 F (36.4 C), temperature source Oral, resp. rate 16, height 5\' 5"  (1.651 m), weight 62.6 kg, SpO2 99 %. Gen: Too lethargic to tolerate exam HEENT: oral mucosa pink and moist, NCAT Cardio: Reg rate Chest: normal effort, normal rate of breathing Abd: soft, non-distended Ext: no edema Psych: lethargic Skin: intact Neuro: Too lethargic to tolerate exam  Assessment/Plan: 1. Functional deficits which require 3+ hours per day of interdisciplinary therapy in a comprehensive inpatient rehab setting. Physiatrist is providing close team supervision and 24 hour management of active medical problems listed below. Physiatrist and rehab team continue to assess barriers to discharge/monitor patient progress toward functional and medical goals  Care Tool:  Bathing        Body parts bathed by helper: Face, Right arm, Left arm, Chest, Abdomen, Front perineal area, Buttocks, Right upper leg, Left upper leg, Right lower leg, Left lower leg     Bathing assist Assist  Level: 2 Helpers     Upper Body Dressing/Undressing Upper body dressing   What is the patient wearing?: Pull over shirt, Hospital gown only, Dress    Upper body assist Assist Level: Total Assistance - Patient < 25%    Lower Body Dressing/Undressing Lower body dressing      What is the patient wearing?: Pants     Lower body assist Assist for lower body dressing: 2 Helpers     Toileting Toileting    Toileting assist Assist for toileting: 2 Helpers     Transfers Chair/bed transfer  Transfers assist  Chair/bed transfer activity did not occur: Safety/medical concerns        Locomotion Ambulation   Ambulation assist              Walk 10 feet activity   Assist           Walk 50 feet activity   Assist           Walk 150 feet activity   Assist           Walk 10 feet on uneven surface  activity   Assist  Wheelchair     Assist               Wheelchair 50 feet with 2 turns activity    Assist            Wheelchair 150 feet activity     Assist          Blood pressure (!) 184/100, pulse 82, temperature (!) 97.5 F (36.4 C), temperature source Oral, resp. rate 16, height 5\' 5"  (1.651 m), weight 62.6 kg, SpO2 99 %.    Medical Problem List and Plan: 1. Functional deficits left-sided weakness and aphasia secondary to left MCA infarct with hemorrhagic conversion, embolic pattern likely due to new diagnosis of a flutter             -patient may  shower             -ELOS/Goals: 16-18 days- min A PT/OT and mod A SLP  -Initial CIR evals today 2.  Antithrombotics: -DVT/anticoagulation:  Pharmaceutical: Lovenox             -antiplatelet therapy: Aspirin 81 mg daily.  Considering DOAC once hemorrhage resolved. 3. Pain Management: Tylenol as needed 4. Mood: Provide emotional support             -antipsychotic agents: N/A 5. Neuropsych: This patient is not capable of making decisions on her own  behalf. 6. Skin/Wound Care: Routine skin checks 7. Fluids/Electrolytes/Nutrition: Routine in and outs with follow-up chemistries 8.  Permissive hypertension.  Patient on no home medications per chart, but granddaughter thought on Lisinopril? 9.  New diagnosis a flutter.  Rate controlled.  Continue aspirin. 10.  Incidental finding cerebral aneurysm.  Follow-up outpatient interventional radiology 11.  Borderline enlarged right tracheal bronchial mediastinal lymph node.  Consider CT chest as outpatient. 12. Lethargy: likely secondary to Xanax: d/ced Xanax.  45 Prediabetic- is new dx- A1c 6.0- due to age, will see if team thinks should be aggressive on this.  14. UTI: started keflex after UA and UC were drawn today 15. Sundowning at night: likely secondary to UTI: ordered prn Seroquel HS    LOS: 1 days A FACE TO FACE EVALUATION WAS PERFORMED  Martha Clan P Jonnell Hentges 07/02/2021, 3:05 PM

## 2021-07-02 NOTE — Evaluation (Addendum)
Speech Language Pathology Assessment and Plan  Patient Details  Name: Shawna Hopkins MRN: [id number] Date of Birth: 1927-12-10  SLP Diagnosis: Aphasia;Cognitive Impairments;Dysphagia  Rehab Potential: Fair ELOS: 2-2.5 weeks   Today's Date: 07/02/2021 SLP Co-Treatment Time: 0900 - 0920    and Today's Date: 07/02/2021 SLP Missed Time: 40 minutes  Missed Time Reason: Patient fatigue   Hospital Problem: Principal Problem:   Left middle cerebral artery stroke [facility])  Past Medical History:  Past Medical History:  Diagnosis Date   Hypertension    Past Surgical History:  Past Surgical History:  Procedure Laterality Date   ABDOMINAL SURGERY     Galbladder    Assessment / Plan / Recommendation Clinical Impression Shawna Hopkins is a 87 year old right-handed female with history of hypertension as well as GERD.  Per chart review patient lives alone.  1 level home 2 steps to entry.  Daughter reports patient independent prior to admission.  Family does her grocery shopping and takes care providing transportation to any appointments.  Presented to [facility] 06/28/2021 with right side weakness and global aphasia.  CT/MRI showed multiple acute cortical subcortical left MCA territory infarcts within the left frontal parietal and temporal lobes.  The largest infarct within the mid to posterior left frontal lobe, left frontal operculum and left insula measuring 2.8 x 2.6 x 4.4 cm.  SWI signal loss present at site of dominant infarct compatible with petechial hemorrhage/hemorrhagic conversion.  Presently on a mechanical soft diet with thin liquids.  Therapy evaluations completed due to patient's aphasia as well as right-sided weakness was admitted for a comprehensive rehab program.   Patient presents with severe expressive/receptive language deficits likely consistent with global aphasia. Evaluation was limited by lethargy impacting functional participation thus assessment will be ongoing. Patient made  grunting noises throughout assessment with no other visible attempts to vocalize or verbally respond. No visible pain behaviors or faces noted. Patient inconsistently responded to yes/no questions regarding personal needs via head nods. Question accuracy of these responses, as Y/N answers to biographical and environmental questions were inaccurate. Patient required max A verbal cues for following 1-step directions and kept her eyes closed for entirety of evaluation. Unable to assess cognition at this time due to lethargy and severity of language deficits. Plan to focus on language goals and may address cognition as pt improves. Unable to complete oral mechanism or swallow evaluation due to inability to follow commands and oral defensiveness. Pt rejected all PO trials. Acute care SLP recommended diet downgrade on 12/27 from Dys 3 textures to Dys 1 due to oral residue and pocketing. Family was in support of this per supporting documentation. Plan to proceed with dys 1 diet and thin liquids due to lethargy. Recommend crushed meds. Will assess tolerance and advance diet as appropriate. Patient will benefit from skilled intervention to increase functional communication, swallow function, functional independence, and decrease burden of care.    Skilled Therapeutic Interventions          SLE. Please see above.     SLP Assessment  Patient will need skilled Speech Lanaguage Pathology Services during CIR admission    Recommendations  SLP Diet Recommendations: Dysphagia 1 (Puree);Thin Liquid Administration via: Straw;Cup Medication Administration: Crushed with puree Supervision: Staff to assist with self feeding;Full supervision/cueing for compensatory strategies Compensations: Slow rate;Small sips/bites;Lingual sweep for clearance of pocketing Postural Changes and/or Swallow Maneuvers: Seated upright 90 degrees Oral Care Recommendations: Oral care BID Patient destination: Home Follow up Recommendations: 24 hour  supervision/assistance;Other (comment) (TBD)  Equipment Recommended: None recommended by SLP    SLP Frequency 3 to 5 out of 7 days   SLP Duration  SLP Intensity  SLP Treatment/Interventions 2-2.5 weeks  Minumum of 1-2 x/day, 30 to 90 minutes  Internal/external aids;Multimodal communication approach;Speech/Language facilitation;Cueing hierarchy;Functional tasks;Patient/family education;Therapeutic Activities    Pain Pain Assessment Pain Scale: Faces Pain Score: 0-No pain  Prior Functioning Type of Home: House  Lives With: Alone Available Help at Discharge: Family;Available PRN/intermittently Vocation: Retired  SLP Evaluation Cognition Overall Cognitive Status: Impaired/Different from baseline Arousal/Alertness: Lethargic Orientation Level: Oriented to person Year:  (unable due to global aphasia) Month:  (unable due to global aphasia) Day of Week:  (unable due to global aphasia) Attention: Focused Focused Attention: Impaired Focused Attention Impairment: Verbal basic;Functional basic Memory:  (unable due to global aphasia/lethargy) Awareness: Impaired Awareness Impairment: Intellectual impairment Problem Solving: Impaired Problem Solving Impairment: Verbal basic;Functional basic Safety/Judgment: Impaired Comments: unable to fully assess due to global aphasia/lethargy  Comprehension Auditory Comprehension Overall Auditory Comprehension: Impaired Yes/No Questions: Impaired Basic Biographical Questions: 0-25% accurate Basic Immediate Environment Questions: 0-24% accurate Commands: Impaired One Step Basic Commands: 0-24% accurate Interfering Components: Attention Visual Recognition/Discrimination Common Objects: Unable to indentify Reading Comprehension Reading Status: Unable to assess (comment) Expression Expression Primary Mode of Expression: Nonverbal - gestures Verbal Expression Overall Verbal Expression: Impaired Initiation: Impaired Repetition:  Impaired Level of Impairment: Word level Naming: Impairment Responsive: 0-25% accurate Confrontation: Impaired Common Objects: Unable to indentify Convergent: Not tested Divergent: Not tested Pragmatics: Unable to assess Oral Motor Oral Motor/Sensory Function Overall Oral Motor/Sensory Function: Other (comment) (difficult to assess due to global aphasia and lethargy) Motor Speech Overall Motor Speech: Other (comment) (difficult to assess due to global aphasia/lethargy with minimal-to-no verbal responses)  Care Tool Care Tool Cognition Ability to hear (with hearing aid or hearing appliances if normally used Ability to hear (with hearing aid or hearing appliances if normally used): 1. Minimal difficulty - difficulty in some environments (e.g. when person speaks softly or setting is noisy)   Expression of Ideas and Wants Expression of Ideas and Wants: 1. Rarely/Never expressess or very difficult - rarely/never expresses self or speech is very difficult to understand   Understanding Verbal and Non-Verbal Content Understanding Verbal and Non-Verbal Content: 1. Rarely/never understands  Memory/Recall Ability Memory/Recall Ability : None of the above were recalled   Bedside Swallowing Assessment General Date of Onset: 06/28/21 Previous Swallow Assessment: 06/29/2021 Diet Prior to this Study: Dysphagia 3 (soft);Thin liquids Temperature Spikes Noted: No Respiratory Status: Room air History of Recent Intubation: No Behavior/Cognition: Lethargic/Drowsy;Doesn't follow directions Oral Cavity - Dentition:  (unable to assess - oral defensiveness) Self-Feeding Abilities: Refused PO Vision:  (unable to assess - pt would not open eyes) Baseline Vocal Quality: Not observed Volitional Cough: Cognitively unable to elicit Volitional Swallow: Unable to elicit  Oral Care Assessment Does patient have any of the following "high(er) risk" factors?: None of the above Does patient have any of the  following "at risk" factors?: None of the above;Diet - patient on thickened liquids Ice Chips Ice chips: Not tested Other Comments: pt refused Thin Liquid Thin Liquid: Not tested (pt refused) Nectar Thick Nectar Thick Liquid: Not tested Honey Thick Honey Thick Liquid: Not tested Puree Puree: Not tested (pt refused) Solid Solid: Not tested (pt refused) BSE Assessment Risk for Aspiration Impact on safety and function: Mild aspiration risk Other Related Risk Factors: History of pneumonia;History of esophageal-related issues;Deconditioning;History of GERD  Short Term Goals: Week 1: SLP Short Term Goal  1 (Week 1): Patient will consume current diet without overt s/sx of aspiration and mod A cues for use of swallowing compensatory strategies SLP Short Term Goal 2 (Week 1): Patient will attend to basic, famliar tasks for 3 minute intervals with max A verbal cues for redirection SLP Short Term Goal 3 (Week 1): Patient will follow basic one-step commands in 50% of opportunities with max A multimodal cues SLP Short Term Goal 4 (Week 1): Patient will respond to yes/no questions regarding basic wants/needs during 50% of occasions with max A multimodal cues SLP Short Term Goal 5 (Week 1): Patient will identify objects in field of 2 with max A verbal/visual cues SLP Short Term Goal 6 (Week 1): Patient will make vocalizations during language tasks (automatic speech) with max A multimodal cues  Refer to Care Plan for Long Term Goals  Recommendations for other services: None   Discharge Criteria: Patient will be discharged from SLP if patient refuses treatment 3 consecutive times without medical reason, if treatment goals not met, if there is a change in medical status, if patient makes no progress towards goals or if patient is discharged from hospital.  The above assessment, treatment plan, treatment alternatives and goals were discussed and mutually agreed upon: No family available/patient  unable  Patty Sermons 07/02/2021, 5:48 PM

## 2021-07-03 LAB — BASIC METABOLIC PANEL
Anion gap: 9 (ref 5–15)
BUN: 31 mg/dL — ABNORMAL HIGH (ref 8–23)
CO2: 24 mmol/L (ref 22–32)
Calcium: 8.8 mg/dL — ABNORMAL LOW (ref 8.9–10.3)
Chloride: 106 mmol/L (ref 98–111)
Creatinine, Ser: 0.92 mg/dL (ref 0.44–1.00)
GFR, Estimated: 59 mL/min — ABNORMAL LOW (ref >60)
Glucose, Bld: 117 mg/dL — ABNORMAL HIGH (ref 70–99)
Potassium: 3.6 mmol/L (ref 3.5–5.1)
Sodium: 139 mmol/L (ref 135–145)

## 2021-07-03 MED ORDER — SACCHAROMYCES BOULARDII 250 MG PO CAPS
250.0000 mg | ORAL_CAPSULE | Freq: Two times a day (BID) | ORAL | Status: DC
  Administered 2021-07-03 – 2021-07-12 (×19): 250 mg via ORAL
  Filled 2021-07-03 (×19): qty 1

## 2021-07-03 MED ORDER — SORBITOL 70 % SOLN
30.0000 mL | Freq: Every day | Status: DC | PRN
  Administered 2021-07-08 – 2021-07-12 (×2): 30 mL via ORAL
  Filled 2021-07-03 (×3): qty 30

## 2021-07-03 NOTE — Progress Notes (Signed)
Speech Language Pathology Daily Session Note  Patient Details  Name: Shawna Hopkins MRN: [id number] Date of Birth: Jul 09, 1927  Today's Date: 07/03/2021 SLP Individual Time: 3536-1443 SLP Individual Time Calculation (min): 26 min  Short Term Goals: Week 1: SLP Short Term Goal 1 (Week 1): Patient will consume current diet without overt s/sx of aspiration and mod A cues for use of swallowing compensatory strategies SLP Short Term Goal 2 (Week 1): Patient will attend to basic, famliar tasks for 3 minute intervals with max A verbal cues for redirection SLP Short Term Goal 3 (Week 1): Patient will follow basic one-step commands in 50% of opportunities with max A multimodal cues SLP Short Term Goal 4 (Week 1): Patient will respond to yes/no questions regarding basic wants/needs during 50% of occasions with max A multimodal cues SLP Short Term Goal 5 (Week 1): Patient will identify objects in field of 2 with max A verbal/visual cues SLP Short Term Goal 6 (Week 1): Patient will make vocalizations during language tasks (automatic speech) with max A multimodal cues  Skilled Therapeutic Interventions: Skilled ST treatment focused on language goals. Patient was alert and very interactive with SLP throughout treatment with min A verbal cues for attention to task. Increased vocalizations and spontaneous speech elicited (i.e., uh-huh, well, okay). Patient responded to biographical and environmental yes/no questions with 86% accuracy (19/22) with head nod and occasional "uh-huh." Patient effectively utilized gestures to communicate she has 4 children (need to confirm), and to open the door. Patient pointed to her notebook to demonstrate her writing. Pt unable to initiate writing however she was able to successfully copy her name x2 and "goodbye." Patient was left in bed with alarm activated and immediate needs within reach at end of session. Continue per current plan of care.      Pain Pain Assessment Pain  Scale: 0-10 Pain Score: 0-No pain Faces Pain Scale: No hurt Critical Care Pain Observation Tool (CPOT) Facial Expression: Relaxed, neutral Body Movements: Absence of movements Muscle Tension: Relaxed Compliance with ventilator (intubated pts.): N/A Vocalization (extubated pts.): Talking in normal tone or no sound CPOT Total: 0  Therapy/Group: Individual Therapy  Tamala Ser 07/03/2021, 4:26 PM

## 2021-07-03 NOTE — Progress Notes (Signed)
Occupational Therapy Session Note  Patient Details  Name: Shawna Hopkins MRN: [id number] Date of Birth: 1928/06/21  Today's Date: 07/03/2021 OT Individual Time: 1005-1058 OT Individual Time Calculation (min): 53 min    Short Term Goals: Week 1:  OT Short Term Goal 1 (Week 1): Pt will don shirt with mod A. OT Short Term Goal 2 (Week 1): Pt will complete toilet transfer with mod A. OT Short Term Goal 3 (Week 1): Pt will complete grooming routine at sink with min A. OT Short Term Goal 4 (Week 1): Pt will don pants with max A.  Skilled Therapeutic Interventions/Progress Updates:    Pt received semi-reclined in bed, no s/sx pain throughout and appears agreeable to therapy. Session focus on self-care retraining, activity tolerance, command follow, safety awareness, dynamic standing balance, functional cognition in prep for improved ADL/IADL/func mobility performance + decreased caregiver burden. Came to sitting EOB with close S and use of bed features. Ambulatory toilet transfer with CGA and light min A to manage RW. Continent void of bladder and close S for toileting tasks.  Completed oral care and hand hygiene standing at sink with CGA for balance and cues to locate / set-up items. Combed hair with CGA.   Total A w/c transport to and from gym for time management and energy conservation.  Walked around nurses station x2 with RW and CGA for balance to locate 8 orange cones. Required min visual cuing overall to locate all cones, pt with more difficulty locating on her R side. One instance of brushing up against counter on her L.  Seated, pt attempted to copy lego brick designs with visual guide, but requiring overall total A from therapist to select appropriate colors and construct. When given written instructions, pt able to sort blocks by color with ~75% accuracy, and then sorted fruits by color with > 15% accuracy.  Short ambulatory transfer > bed with CGA and returned to supine with close  S.  Pt left with HOB at 30 degrees with bed alarm engaged, call bell in reach, and all immediate needs met.    Therapy Documentation Precautions:  Precautions Precaution Comments: globally aphasic, HOB > 30 degrees Restrictions Weight Bearing Restrictions: No  Pain: no signs/sx throughout   ADL: See Care Tool for more details.  Therapy/Group: Individual Therapy  Volanda Napoleon MS, OTR/L  07/03/2021, 6:48 AM

## 2021-07-03 NOTE — Progress Notes (Signed)
Speech Language Pathology Daily Session Note  Patient Details  Name: Shawna Hopkins MRN: [id number] Date of Birth: 01-Dec-1927  Today's Date: 07/03/2021 SLP Individual Time: 1120-1200 SLP Individual Time Calculation (min): 40 min  Short Term Goals: Week 1: SLP Short Term Goal 1 (Week 1): Patient will consume current diet without overt s/sx of aspiration and mod A cues for use of swallowing compensatory strategies SLP Short Term Goal 2 (Week 1): Patient will attend to basic, famliar tasks for 3 minute intervals with max A verbal cues for redirection SLP Short Term Goal 3 (Week 1): Patient will follow basic one-step commands in 50% of opportunities with max A multimodal cues SLP Short Term Goal 4 (Week 1): Patient will respond to yes/no questions regarding basic wants/needs during 50% of occasions with max A multimodal cues SLP Short Term Goal 5 (Week 1): Patient will identify objects in field of 2 with max A verbal/visual cues SLP Short Term Goal 6 (Week 1): Patient will make vocalizations during language tasks (automatic speech) with max A multimodal cues  Skilled Therapeutic Interventions: Skilled ST treatment focused on swallowing and language goals. Patient was sleeping on arrival and roused to verbal stimuli. Patient made meaningful vocalizations throughout session although unintelligible. She also attempted to gesture but unfortunately gestures were ineffective SLP facilitated automatic speech tasks. Patient unable to count 1-10 w/ max A verbal/visual cues although maintained eye contact with SLP during this task. She approximated several words during singing Social research officer, government, We wish you a merry christmas, happy birthday). Patient responded to simple yes/no questions with head nods. She appeared to understand basic questions and commands. For example, when asked if she was thirsty she looked at her water cup and nodded yes. SLP then requested patient to reach for water cup and patient  successfully executed command. Patient consumed single and sequential thin liquid sips by straw without overt s/s of aspiration. She was agreeable to trial dys 1 and dys 2 textures. Pt tolerated dys 1 with efficient oral prep and complete oral clearance. She took a small bite of dys 2 texture (graham cracker in pudding) with prolonged and incomplete mastication. Patient held unchewed bolus in mouth for awhile, exhibited sighing, and appeared fatigued. She eventually masticated the bolus more and initiated swallow response. Recommend continuation of dys 1 diet, thin liquids, and crush medications. Nurse tech reported patient was independent with self feeding following set-up A. Recommend intermittent supervision during meals at this time. Patient was left in bed with alarm activated and immediate needs within reach at end of session. Pt waved goodbye and smiled as SLP exited room. Continue per current plan of care.      Pain Pain Assessment Pain Scale: 0-10 Faces Pain Scale: No hurt Critical Care Pain Observation Tool (CPOT) Facial Expression: Relaxed, neutral Body Movements: Absence of movements Muscle Tension: Relaxed Compliance with ventilator (intubated pts.): N/A Vocalization (extubated pts.): Talking in normal tone or no sound CPOT Total: 0  Therapy/Group: Individual Therapy  Tamala Ser 07/03/2021, 12:31 PM

## 2021-07-03 NOTE — Progress Notes (Signed)
PROGRESS NOTE   Subjective/Complaints: More alert today Pleasantly confused Chart reviewed - she did not require prn seroquel at night- will d/c as would like to avoid sedating meds  ROS: +somnolence- improved  Objective:   No results found. Recent Labs    07/01/21 0244 07/02/21 0658  WBC 10.0 8.4  HGB 11.5* 11.5*  HCT 35.1* 34.3*  PLT 202 205   Recent Labs    07/02/21 0658 07/03/21 0515  NA 138 139  K 3.0* 3.6  CL 104 106  CO2 25 24  GLUCOSE 127* 117*  BUN 22 31*  CREATININE 1.00 0.92  CALCIUM 8.6* 8.8*    Intake/Output Summary (Last 24 hours) at 07/03/2021 1232 Last data filed at 07/03/2021 0700 Gross per 24 hour  Intake 480 ml  Output --  Net 480 ml        Physical Exam: Vital Signs Blood pressure (!) 178/100, pulse 90, temperature 98.1 F (36.7 C), temperature source Oral, resp. rate 18, height 5\' 5"  (1.651 m), weight 62.6 kg, SpO2 97 %. Gen: Too lethargic to tolerate exam HEENT: oral mucosa pink and moist, NCAT Cardio: Reg rate Chest: normal effort, normal rate of breathing Abd: soft, non-distended Ext: no edema Musculoskeletal:     Cervical back: Normal range of motion. No rigidity.     Comments: 5/5 in LUE/LLE Considering difficulty testing RUE/RLE- at least 4/5- but couldn't comply with entire exam  Skin:    General: Skin is warm and dry.  Neurological:     Mental Status: She is alert.     Comments: Patient is alert.  No acute distress.  She is aphasic.  She would not follow commands but mimic some actions.   Pt was trying to talk- couldn't make sounds- wrote legible words- go home on paper- following directions much better- still nonverbal Followed instructions ~ 60-70% of time Appears to have decreased sensation to light touch on R side per pt shaking head no-answered the same info if negative or positive answer- so think correct  Psychiatric:     Comments: Sleepy esp after exam    Assessment/Plan: 1. Functional deficits which require 3+ hours per day of interdisciplinary therapy in a comprehensive inpatient rehab setting. Physiatrist is providing close team supervision and 24 hour management of active medical problems listed below. Physiatrist and rehab team continue to assess barriers to discharge/monitor patient progress toward functional and medical goals  Care Tool:  Bathing        Body parts bathed by helper: Face, Right arm, Left arm, Chest, Abdomen, Front perineal area, Buttocks, Right upper leg, Left upper leg, Right lower leg, Left lower leg     Bathing assist Assist Level: 2 Helpers     Upper Body Dressing/Undressing Upper body dressing   What is the patient wearing?: Pull over shirt    Upper body assist Assist Level: Total Assistance - Patient < 25%    Lower Body Dressing/Undressing Lower body dressing      What is the patient wearing?: Pants     Lower body assist Assist for lower body dressing: 2 Helpers     Toileting Toileting    Toileting assist Assist for toileting:  Supervision/Verbal cueing     Transfers Chair/bed transfer  Transfers assist  Chair/bed transfer activity did not occur: Safety/medical concerns  Chair/bed transfer assist level: Minimal Assistance - Patient > 75%     Locomotion Ambulation   Ambulation assist      Assist level: Minimal Assistance - Patient > 75% Assistive device: Walker-rolling Max distance: 20   Walk 10 feet activity   Assist     Assist level: Minimal Assistance - Patient > 75%     Walk 50 feet activity   Assist Walk 50 feet with 2 turns activity did not occur: Refused         Walk 150 feet activity   Assist Walk 150 feet activity did not occur: Refused         Walk 10 feet on uneven surface  activity   Assist Walk 10 feet on uneven surfaces activity did not occur: Refused         Wheelchair     Assist Is the patient using a wheelchair?:  Yes Type of Wheelchair: Manual Wheelchair activity did not occur: Refused         Wheelchair 50 feet with 2 turns activity    Assist    Wheelchair 50 feet with 2 turns activity did not occur: Refused       Wheelchair 150 feet activity     Assist  Wheelchair 150 feet activity did not occur: Refused       Blood pressure (!) 178/100, pulse 90, temperature 98.1 F (36.7 C), temperature source Oral, resp. rate 18, height 5\' 5"  (1.651 m), weight 62.6 kg, SpO2 97 %.    Medical Problem List and Plan: 1. Functional deficits left-sided weakness and aphasia secondary to left MCA infarct with hemorrhagic conversion, embolic pattern likely due to new diagnosis of a flutter             -patient may  shower             -ELOS/Goals: 16-18 days- min A PT/OT and mod A SLP  -Continue CIR 2.  Impaired mobility: continue Lovenox             -antiplatelet therapy: Aspirin 81 mg daily.  Considering DOAC once hemorrhage resolved. 3. Pain Management: Tylenol as needed 4. Mood: Provide emotional support             -antipsychotic agents: N/A 5. Neuropsych: This patient is not capable of making decisions on her own behalf. 6. Skin/Wound Care: Routine skin checks 7. Fluids/Electrolytes/Nutrition: Routine in and outs with follow-up chemistries 8.  Permissive hypertension.  Patient on no home medications per chart, but granddaughter thought on Lisinopril? 9.  New diagnosis a flutter.  Rate controlled.  Continue aspirin. 10.  Incidental finding cerebral aneurysm.  Follow-up outpatient interventional radiology 11.  Borderline enlarged right tracheal bronchial mediastinal lymph node.  Consider CT chest as outpatient. 12. Lethargy: likely secondary to Xanax: d/ced Xanax.  13 Prediabetic- is new dx- A1c 6.0- due to age, will see if team thinks should be aggressive on this. Place nursing order to provide only water- no drinks with added sugars 14. UTI: continue keflex. Start probiotic. UC with  >100,000 Ecoli. F/u sesitivities 15. Sundowning at night: likely secondary to UTI, improved: d/c Seroquel    LOS: 2 days A FACE TO FACE EVALUATION WAS PERFORMED  Mouhamad Teed 07/03/2021, 12:32 PM

## 2021-07-03 NOTE — Progress Notes (Signed)
Occupational Therapy Session Note  Patient Details  Name: Shawna Hopkins MRN: [id number] Date of Birth: September 25, 1927  Today's Date: 07/03/2021 OT Individual Time: 7673-4193 OT Individual Time Calculation (min): 23 min    Short Term Goals: Week 1:  OT Short Term Goal 1 (Week 1): Pt will don shirt with mod A. OT Short Term Goal 2 (Week 1): Pt will complete toilet transfer with mod A. OT Short Term Goal 3 (Week 1): Pt will complete grooming routine at sink with min A. OT Short Term Goal 4 (Week 1): Pt will don pants with max A.  Skilled Therapeutic Interventions/Progress Updates:    Pt resting in bed with family members present.Pt initially reluctant to participating in therapy. Pt with global aphasia but appears to follow simple one step commands. Pt sat EOB with supervision. Pt sang Romero Liner with therapist X 2 with 50% accuracy. Pt agreeable to standing X 1 but did not remain standing more then 15 secs. After sing Romero Liner, pt initiated sit>supine but comprehended command to move buttocks to [facility] before bringing BLE onto bed. Pt followed command to move buttocks to center of bed. Pt remained in bed with family present. Bed alarm activated.   Therapy Documentation Precautions:  Precautions Precautions: Fall Precaution Comments: globally aphasic Restrictions Weight Bearing Restrictions: No Pain: Pt with no s/s of pain   Therapy/Group: Individual Therapy  Rich Brave 07/03/2021, 2:27 PM

## 2021-07-03 NOTE — Progress Notes (Addendum)
Physical Therapy Session Note  Patient Details  Name: [name] MRN: [id number] Date of Birth: [birth year]  Today's Date: 07/03/2021 PT Individual Time: 0800-0900 PT Individual Time Calculation (min): 60 min   Short Term Goals: Week 1:  PT Short Term Goal 1 (Week 1): Pt will ambulate with RW >189f with min assist PT Short Term Goal 2 (Week 1): [facility] PT Short Term Goal 3 (Week 1): Pt will ascend 4 steps with BUE support and min assist   Skilled Therapeutic Interventions/Progress Updates:   Pt received supine in bed and agreeable to PT. Supine>sit transfer with supervision assist and cues for use of bed features as needed. Stand pivot to WC with RW, but poor use of AD using only 1 UE on contralateral hand grip, despite cues from PT for correct position. [facility]  Gait training without AD x 719fand min assist with cues for step width and attention to task. No change in gait pattern with instruction due to receptive aphasia. Gait training performed with RW x 7080fnd CGA. HR increased to 109 following gait and then decreased to 79 after 45sec rest break.   Stair management training with BUE support on bil rails completed with min assist using step over step for ascent and step to with decent. Noted to have mild knee instability on the last step when leading with the LLE, rather than RLE.   Pt performed 5 time sit<>stand (5xSTS): 27 sec (>15 sec indicates increased fall risk). HR increased to 120 per digital assessment. PT perform manual assessment and measured 82bpm.   PT instructed pt in TUG: 27 sec with HHA from PT and 33 sec with RW (>13.5 sec indicates increased fall risk).  Min assist from PT for balance in turn without AD and CGA with RW, but noted for require increased time to stand with RW due to poor position of BLE  Nustep sustained attention and endurance task x 6 minutes with cues for increased steps  per minute as tolerated.  Car transfer with min assist and no AD. Pt noted to use SLS on the RLE to place LLE in cat regardless of instruction from PT. Will attempt to educate on safety in in subsequent sessions.   Pt reports need for toiletting. Ambulatory transfer to bathroom with RW and min assist. Able to void bladder sitting on toilet with supervision assist for pericare and min assist for clothing management. Pt returned to room and performed stand pivot transfer to bed with RW and CGA. Sit>supine completed with supervision assist, and left supine in bed with call bell in reach and all needs met.      Therapy Documentation Precautions:  Precautions Precautions: Fall Precaution Comments: globally aphasic Restrictions Weight Bearing Restrictions: No   Pain: Pain Assessment Pain Scale: Faces Faces Pain Scale: No hurt    Therapy/Group: Individual Therapy  AusLorie Phenix/29/2022, 9:03 AM

## 2021-07-03 NOTE — Plan of Care (Signed)
°  Problem: RH Balance Goal: LTG Patient will maintain dynamic sitting balance (PT) Description: LTG:  Patient will maintain dynamic sitting balance with assistance during mobility activities (PT) Flowsheets (Taken 07/03/2021 0726) LTG: Pt will maintain dynamic sitting balance during mobility activities with:: Independent Goal: LTG Patient will maintain dynamic standing balance (PT) Description: LTG:  Patient will maintain dynamic standing balance with assistance during mobility activities (PT) Flowsheets (Taken 07/03/2021 0726) LTG: Pt will maintain dynamic standing balance during mobility activities with:: Supervision/Verbal cueing   Problem: RH Bed Mobility Goal: LTG Patient will perform bed mobility with assist (PT) Description: LTG: Patient will perform bed mobility with assistance, with/without cues (PT). Flowsheets (Taken 07/03/2021 0726) LTG: Pt will perform bed mobility with assistance level of: Independent with assistive device    Problem: RH Car Transfers Goal: LTG Patient will perform car transfers with assist (PT) Description: LTG: Patient will perform car transfers with assistance (PT). Flowsheets (Taken 07/03/2021 0726) LTG: Pt will perform car transfers with assist:: Supervision/Verbal cueing   Problem: RH Furniture Transfers Goal: LTG Patient will perform furniture transfers w/assist (OT/PT) Description: LTG: Patient will perform furniture transfers  with assistance (OT/PT). Flowsheets (Taken 07/03/2021 0726) LTG: Pt will perform furniture transfers with assist:: Supervision/Verbal cueing   Problem: RH Ambulation Goal: LTG Patient will ambulate in controlled environment (PT) Description: LTG: Patient will ambulate in a controlled environment, # of feet with assistance (PT). Flowsheets (Taken 07/03/2021 0726) LTG: Pt will ambulate in controlled environ  assist needed:: Supervision/Verbal cueing LTG: Ambulation distance in controlled environment: 134ft with LRAD Goal:  LTG Patient will ambulate in home environment (PT) Description: LTG: Patient will ambulate in home environment, # of feet with assistance (PT). Flowsheets (Taken 07/03/2021 0726) LTG: Pt will ambulate in home environ  assist needed:: Supervision/Verbal cueing LTG: Ambulation distance in home environment: 55ft with LRAD   Problem: RH Stairs Goal: LTG Patient will ambulate up and down stairs w/assist (PT) Description: LTG: Patient will ambulate up and down # of stairs with assistance (PT) Flowsheets (Taken 07/03/2021 0726) LTG: Pt will ambulate up/down stairs assist needed:: Contact Guard/Touching assist LTG: Pt will  ambulate up and down number of stairs: 2 steps without rail

## 2021-07-03 NOTE — Progress Notes (Signed)
Inpatient Rehabilitation Center Individual Statement of Services  Patient Name:  Shawna Hopkins  Date:  07/03/2021  Welcome to the Inpatient Rehabilitation Center.  Our goal is to provide you with an individualized program based on your diagnosis and situation, designed to meet your specific needs.  With this comprehensive rehabilitation program, you will be expected to participate in at least 3 hours of rehabilitation therapies Monday-Friday, with modified therapy programming on the weekends.  Your rehabilitation program will include the following services:  Physical Therapy (PT), Occupational Therapy (OT), Speech Therapy (ST), 24 hour per day rehabilitation nursing, Therapeutic Recreaction (TR), Neuropsychology, Care Coordinator, Rehabilitation Medicine, Nutrition Services, Pharmacy Services, and Other  Weekly team conferences will be held on Wednesdays to discuss your progress.  Your Inpatient Rehabilitation Care Coordinator will talk with you frequently to get your input and to update you on team discussions.  Team conferences with you and your family in attendance may also be held.  Expected length of stay: 16-18 Days  Overall anticipated outcome:  Min A- Mod A  Depending on your progress and recovery, your program may change. Your Inpatient Rehabilitation Care Coordinator will coordinate services and will keep you informed of any changes. Your Inpatient Rehabilitation Care Coordinator's name and contact numbers are listed  below.  The following services may also be recommended but are not provided by the Inpatient Rehabilitation Center:   Home Health Rehabiltiation Services Outpatient Rehabilitation Services    Arrangements will be made to provide these services after discharge if needed.  Arrangements include referral to agencies that provide these services.  Your insurance has been verified to be:  Medicare A & B Your primary doctor is:  Phineas Real [facility]   Pertinent  information will be shared with your doctor and your insurance company.  Inpatient Rehabilitation Care Coordinator:  Lavera Guise, [place] [phone] or [phone]  Information discussed with and copy given to patient by: Andria Rhein, 07/03/2021, 10:00 AM

## 2021-07-03 NOTE — Progress Notes (Signed)
Inpatient Rehabilitation Care Coordinator Assessment and Plan Patient Details  Name: LAVERGNE HILTUNEN MRN: [id number] Date of Birth: 1927/12/09  Today's Date: 07/03/2021  Hospital Problems: Principal Problem:   Left middle cerebral artery stroke [facility])  Past Medical History:  Past Medical History:  Diagnosis Date   Hypertension    Past Surgical History:  Past Surgical History:  Procedure Laterality Date   ABDOMINAL SURGERY     Galbladder   Social History:  reports that she has never smoked. She has never used smokeless tobacco. She reports that she does not currently use alcohol. She reports that she does not use drugs.  Family / Support Systems Marital Status: Widow/Widower Other Supports: [place] Biomedical scientist), Susie (Sister) Anticipated Caregiver: Son, daughter & grandson to rotate care Ability/Limitations of Caregiver: MOD A Caregiver Availability: 24/7 Family Dynamics: Support from children and grandchildren  Social History Preferred language: English Religion: [employer] - How often do you need to have someone help you when you read instructions, pamphlets, or other written material from your doctor or pharmacy?: Always Writes: Yes Employment Status: Retired Public relations account executive Issues: n/a Guardian/Conservator: [place] Troyer   Abuse/Neglect Abuse/Neglect Assessment Can Be Completed: Yes Physical Abuse: Denies Verbal Abuse: Denies Sexual Abuse: Denies Exploitation of patient/patient's resources: Denies Self-Neglect: Denies  Patient response to: Social Isolation - How often do you feel lonely or isolated from those around you?: Never  Emotional Status Recent Psychosocial Issues: n/a Psychiatric History: n/a Substance Abuse History: n/a  Patient / Family Perceptions, Expectations & Goals Pt/Family understanding of illness & functional limitations: yes Premorbid pt/family roles/activities: Patient independent. Family transports patient to  appointments, family grocery shops Anticipated changes in roles/activities/participation: Family to continue assistance inside the home, children plan to rotate Pt/family expectations/goals: Min/Mod A  Education officer, environmental Agencies: None Premorbid Home Care/DME Agencies: Other (Comment) ([employer], [employer]) Transportation available at discharge: Family transports patient to appointments Is the patient able to respond to transportation needs?: Yes In the past 12 months, has lack of transportation kept you from medical appointments or from getting medications?: No In the past 12 months, has lack of transportation kept you from meetings, work, or from getting things needed for daily living?: No  Discharge Planning Living Arrangements: Alone Support Systems: Children Type of Residence: Private residence (1 level home, 2 steps) Insurance Resources: Information systems manager, [employer] (specify county) Museum/gallery curator Resources: Family Support, Social Security Financial Screen Referred: No Living Expenses: Own Money Management: Family Does the patient have any problems obtaining your medications?: No Home Management: Independent prior Patient/Family Preliminary Plans: Patient children to assist with money and medication management Care Coordinator Anticipated Follow Up Needs: HH/OP Expected length of stay: 16-18 Days  Clinical Impression SW met with patient family (Melvin & [place]) via telephone, introduced self, explained role, provided conference updates and addressed questions & concerns. Patient grandson ([place]ali) will be the primary contact while patient is admitted to CIR. Children plan to assist patient at d/c. No additional questions or concerns.  Dyanne Iha 07/03/2021, 9:53 AM

## 2021-07-04 LAB — URINE CULTURE: Culture: >=100000 — AB

## 2021-07-04 MED ORDER — B COMPLEX-C PO TABS
1.0000 | ORAL_TABLET | Freq: Every day | ORAL | Status: DC
  Administered 2021-07-04 – 2021-07-12 (×9): 1 via ORAL
  Filled 2021-07-04 (×9): qty 1

## 2021-07-04 NOTE — Progress Notes (Signed)
Speech Language Pathology Daily Session Note  Patient Details  Name: Shawna Hopkins MRN: [id number] Date of Birth: [birth year]  Today's Date: 07/04/2021 SLP Individual Time: 0932-1000 SLP Individual Time Calculation (min): 28 min  Short Term Goals: Week 1: SLP Short Term Goal 1 (Week 1): Patient will consume current diet without overt s/sx of aspiration and mod A cues for use of swallowing compensatory strategies SLP Short Term Goal 2 (Week 1): Patient will attend to basic, famliar tasks for 3 minute intervals with max A verbal cues for redirection SLP Short Term Goal 3 (Week 1): Patient will follow basic one-step commands in 50% of opportunities with max A multimodal cues SLP Short Term Goal 4 (Week 1): Patient will respond to yes/no questions regarding basic wants/needs during 50% of occasions with max A multimodal cues SLP Short Term Goal 5 (Week 1): Patient will identify objects in field of 2 with max A verbal/visual cues SLP Short Term Goal 6 (Week 1): Patient will make vocalizations during language tasks (automatic speech) with max A multimodal cues  Skilled Therapeutic Interventions: Skilled ST treatment focused on language goals. SLP facilitated session by providing max A multimodal cues (verbal repetition, semantic cues, object function, written word, gestures) for object identification in field of 2-3 to achieve 28% accuracy. Patient pointed to field of 2 large print letters written vertically with 10% accuracy. Patient approximated /b/, unable to approximate other phonemes. Pt communicated needs regarding comfort in bed with head nod yes/no and facial expressions. SLP provided education on aphasia and verbal encouragement. Patient became tearful when discussing communication challenges. Patient was left in bed with alarm activated and immediate needs within reach at end of session. Continue per current plan of care.      Pain Pain Assessment Pain Scale: Faces Faces Pain Scale: No  hurt Critical Care Pain Observation Tool (CPOT) Facial Expression: Relaxed, neutral Body Movements: Absence of movements Muscle Tension: Relaxed Compliance with ventilator (intubated pts.): N/A Vocalization (extubated pts.): Talking in normal tone or no sound CPOT Total: 0  Therapy/Group: Individual Therapy  Tamala Ser 07/04/2021, 10:27 AM

## 2021-07-04 NOTE — Progress Notes (Signed)
Physical Therapy Session Note  Patient Details  Name: Shawna Hopkins MRN: [id number] Date of Birth: 1928/04/16  Today's Date: 07/04/2021 PT Individual Time: 9528-4132 PT Individual Time Calculation (min): 41 min   Short Term Goals: Week 1:  PT Short Term Goal 1 (Week 1): Pt will ambulate with RW >168ft with min assist PT Short Term Goal 2 (Week 1): Pt will transfer to and from [facility] with CGA and LRAD PT Short Term Goal 3 (Week 1): Pt will ascend 4 steps with BUE support and min assist  Skilled Therapeutic Interventions/Progress Updates:  Patient supine in bed on entrance to room. Patient alert and agreeable to PT session.   Patient with no pain complaint throughout session.  Therapeutic Activity: Bed Mobility: Patient performed supine <> sit with MinA requiring physical assist with bringing UB to full upright seated position on EOB and for scooting forward. Once seated EOB pt provided with Max A to don socks and shoes.  Transfers: Patient performed sit<>stand and stand pivot transfers throughout session initially with MinA and improving to CGA/ supervision following NMR. Provided verbal cues for technique throughout.  Gait Training:  Patient ambulated 49' x1 with no AD and overall CGA with intermittent MinA for balance. Pt guided in backward walking x15' with MinA and vc for increased step length and toe-to-heel progression with minimal adjustment made to step quality. Final ambulation 125' x1 using RW with improvement in step height/ length and heel-to-toe progression.  Neuromuscular Re-ed: NMR facilitated during session with focus on standing balance. Pt guided in sit<>stand training and improving use of BLE for controlled ascent/ descent. Pt also guided in Berg Balance test. Patient demonstrates increased fall risk as noted by score of  24 /56 on Berg Balance Scale.  (<36= high risk for falls, close to 100%; 37-45 significant >80%; 46-51 moderate >50%; 52-55 lower >25%)  NMR performed  for improvements in motor control and coordination, balance, sequencing, judgement, and self confidence/ efficacy in performing all aspects of mobility at highest level of independence.   Patient supine  in bed at end of session with brakes locked, bed alarm set, and all needs within reach. Through questioning, pt able to indicate that she needs to use bathroom. NT notified and provided assist.      Therapy Documentation Precautions:  Precautions Precautions: Fall Precaution Comments: globally aphasic Restrictions Weight Bearing Restrictions: No General:   Pain: Pain Assessment Pain Scale: Faces Faces Pain Scale: No hurt  Therapy/Group: Individual Therapy  Loel Dubonnet PT, DPT 07/04/2021, 10:09 AM

## 2021-07-04 NOTE — Progress Notes (Signed)
Occupational Therapy Session Note  Patient Details  Name: Shawna Hopkins MRN: [id number] Date of Birth: [birth year]  Today's Date: 07/04/2021 OT Individual Time: 6256-3893 OT Individual Time Calculation (min): 68 min    Short Term Goals: Week 1:  OT Short Term Goal 1 (Week 1): Pt will don shirt with mod A. OT Short Term Goal 2 (Week 1): Pt will complete toilet transfer with mod A. OT Short Term Goal 3 (Week 1): Pt will complete grooming routine at sink with min A. OT Short Term Goal 4 (Week 1): Pt will don pants with max A.  Skilled Therapeutic Interventions/Progress Updates:    Pt received entering bathroom with NT, no s/sx pain throughout session, agreeable to therapy. Session focus on self-care retraining, activity tolerance, functional cognition, command follow, dynamic standing balance in prep for improved ADL/IADL/func mobility performance + decreased caregiver burden. Completed toilet transfer with CGA and RW. Completed toileting tasks post continent void of bladder with CGA , heavy reliance on grab bar to power up. Washed hands at sink and completed oral care with close S and set-up of materials, min visual cues to locate soap/paper towels. Ambulated throughout session with close S to intermittent CGA to manage RW and turns.   Seated at [facility], completed to the following games to target command follow, sustained attention, and RUE coordination:  - Able to complete Single Target Game after visual demonstration with 67.57% accuracy, 2.21 reaction time, and 25 hits; demod difficulty raising RUE past shoulder height.  -Attempted Trail Making Part A and Bell Cancellation Task but requiring step-by-step visual cuing and total A to accurately complete/participate.   In kitchen, participated in various meal prep/cleaning activities to target functional cognition/command; pt able to accurately sort various utensils into organizer, mix ingredients together of jello, and wash dishes  appropriately.   Finally, able to match cards with 99% accuracy. Able to path find way back to room with only min visual cues.  Pt left with HOB at 30 degrees with bed alarm engaged, call bell in reach, and all immediate needs met.    Therapy Documentation Precautions:  Precautions Precautions: Fall Precaution Comments: globally aphasic Restrictions Weight Bearing Restrictions: No  Pain: no s/sx throughout   ADL: See Care Tool for more details.   Therapy/Group: Individual Therapy  Volanda Napoleon MS, OTR/L  07/04/2021, 6:55 AM

## 2021-07-04 NOTE — Progress Notes (Signed)
PROGRESS NOTE   Subjective/Complaints: Continues to be more alert and participatory in exam Tearful when I mention her granddaughter Aphasia severe Denies pain  ROS: Denies pain (facial scale)  Objective:   No results found. Recent Labs    07/02/21 0658  WBC 8.4  HGB 11.5*  HCT 34.3*  PLT 205   Recent Labs    07/02/21 0658 07/03/21 0515  NA 138 139  K 3.0* 3.6  CL 104 106  CO2 25 24  GLUCOSE 127* 117*  BUN 22 31*  CREATININE 1.00 0.92  CALCIUM 8.6* 8.8*    Intake/Output Summary (Last 24 hours) at 07/04/2021 1055 Last data filed at 07/04/2021 0700 Gross per 24 hour  Intake 460 ml  Output --  Net 460 ml        Physical Exam: Vital Signs Blood pressure (!) 154/106, pulse 96, temperature 98.6 F (37 C), resp. rate 17, height 5\' 5"  (1.651 m), weight 62.6 kg, SpO2 99 %. Gen: More alert and interactive HEENT: oral mucosa pink and moist, NCAT Cardio: Reg rate Chest: normal effort, normal rate of breathing Abd: soft, non-distended Ext: no edema Musculoskeletal:     Cervical back: Normal range of motion. No rigidity.     Comments: 5/5 in LUE/LLE Considering difficulty testing RUE/RLE- at least 4/5- but couldn't comply with entire exam  Skin:    General: Skin is warm and dry.  Neurological:     Mental Status: She is alert.     Comments: Patient is alert.  No acute distress.  She is aphasic.  She would not follow commands but mimic some actions.   Pt was trying to talk- couldn't make sounds- wrote legible words- go home on paper- following directions much better- still nonverbal Followed instructions ~ 60-70% of time Appears to have decreased sensation to light touch on R side per pt shaking head no-answered the same info if negative or positive answer- so think correct  Psychiatric:     Comments: Sleepy esp after exam   Assessment/Plan: 1. Functional deficits which require 3+ hours per day of  interdisciplinary therapy in a comprehensive inpatient rehab setting. Physiatrist is providing close team supervision and 24 hour management of active medical problems listed below. Physiatrist and rehab team continue to assess barriers to discharge/monitor patient progress toward functional and medical goals  Care Tool:  Bathing        Body parts bathed by helper: Face, Right arm, Left arm, Chest, Abdomen, Front perineal area, Buttocks, Right upper leg, Left upper leg, Right lower leg, Left lower leg     Bathing assist Assist Level: 2 Helpers     Upper Body Dressing/Undressing Upper body dressing   What is the patient wearing?: Pull over shirt    Upper body assist Assist Level: Total Assistance - Patient < 25%    Lower Body Dressing/Undressing Lower body dressing      What is the patient wearing?: Incontinence brief, Pants     Lower body assist Assist for lower body dressing: 2 Helpers     Toileting Toileting    Toileting assist Assist for toileting: Contact Guard/Touching assist     Transfers Chair/bed transfer  Transfers  assist  Chair/bed transfer activity did not occur: Safety/medical concerns  Chair/bed transfer assist level: Minimal Assistance - Patient > 75%     Locomotion Ambulation   Ambulation assist      Assist level: Minimal Assistance - Patient > 75% Assistive device: Walker-rolling Max distance: 20   Walk 10 feet activity   Assist     Assist level: Minimal Assistance - Patient > 75%     Walk 50 feet activity   Assist Walk 50 feet with 2 turns activity did not occur: Refused         Walk 150 feet activity   Assist Walk 150 feet activity did not occur: Refused         Walk 10 feet on uneven surface  activity   Assist Walk 10 feet on uneven surfaces activity did not occur: Refused         Wheelchair     Assist Is the patient using a wheelchair?: Yes Type of Wheelchair: Manual Wheelchair activity did not  occur: Refused         Wheelchair 50 feet with 2 turns activity    Assist    Wheelchair 50 feet with 2 turns activity did not occur: Refused       Wheelchair 150 feet activity     Assist  Wheelchair 150 feet activity did not occur: Refused       Blood pressure (!) 154/106, pulse 96, temperature 98.6 F (37 C), resp. rate 17, height 5\' 5"  (1.651 m), weight 62.6 kg, SpO2 99 %.    Medical Problem List and Plan: 1. Functional deficits left-sided weakness and aphasia secondary to left MCA infarct with hemorrhagic conversion, embolic pattern likely due to new diagnosis of a flutter             -patient may  shower             -ELOS/Goals: 16-18 days- min A PT/OT and mod A SLP  Continue CIR 2.  Impaired mobility: Continue Lovenox             -antiplatelet therapy: Aspirin 81 mg daily.  Considering DOAC once hemorrhage resolved. 3. Pain Management: Tylenol as needed 4. Mood: Provide emotional support             -antipsychotic agents: N/A 5. Neuropsych: This patient is not capable of making decisions on her own behalf. 6. Skin/Wound Care: Routine skin checks 7. Fluids/Electrolytes/Nutrition: Routine in and outs with follow-up chemistries 8.  Permissive hypertension.  Patient on no home medications per chart, but granddaughter thought on Lisinopril? Currently 6 days from stroke, consider starting lisinopril at 1 week 9.  New diagnosis a flutter.  Rate controlled.  Continue aspirin. 10.  Incidental finding cerebral aneurysm.  Follow-up outpatient interventional radiology 11.  Borderline enlarged right tracheal bronchial mediastinal lymph node.  Consider CT chest as outpatient. 12. Lethargy: likely secondary to Xanax: d/ced Xanax. Resolved. Start B/C vitamin complex to promote stroke recovery 13 Prediabetic- is new dx- A1c 6.0- due to age, will see if team thinks should be aggressive on this. Place nursing order to provide only water- no drinks with added sugars 14. UTI:  continue keflex 7 days Start probiotic. UC with >100,000 Ecoli pansensitive 15. Sundowning at night: likely secondary to UTI, improved: d/c Seroquel    LOS: 3 days A FACE TO FACE EVALUATION WAS PERFORMED  Shawna Hopkins 07/04/2021, 10:55 AM

## 2021-07-04 NOTE — Progress Notes (Signed)
Physical Therapy Session Note  Patient Details  Name: Shawna Hopkins MRN: [id number] Date of Birth: 1927-07-27  Today's Date: 07/04/2021 PT Individual Time: 1304-1400 PT Individual Time Calculation (min): 56 min   Short Term Goals: Week 1:  PT Short Term Goal 1 (Week 1): Pt will ambulate with RW >135f with min assist PT Short Term Goal 2 (Week 1): [facility] PT Short Term Goal 3 (Week 1): Pt will ascend 4 steps with BUE support and min assist   Skilled Therapeutic Interventions/Progress Updates:   Pt received supine in bed and agreeable to PT. Supine>sit transfer with supervision assist and cues for initiation of movement. [facility] and no AD. Attempted to transport pt to hall, but pt blocking door with foot with "tearful" noise. Pt questioned pt on need for toileting, which pt nodded yes Toiletting with ambulatolry transfer with RW and supervision assist. Pt able to performed all clothing management and hygiene with supervision assist.  .   [place]  Standing; Visual scanning user paced 2 x 1 min  and sitting sequence 1 -10 and A-J. Min cues for numbers and max cues for letter recognition.   Visual pursuits single with supervision Assist and multiple in category x 2 then sequence x 1. Max cues for sequencing and color distiction   Bell cancellation test. 7.50 min to complete, 5 errors, 1 distractor.   Pt returned to room and performed stand pivot transfer to bed with min assist and no AD. Sit>supine completed with increased time and supervision assist, and left supine in bed with call bell in reach and all needs met.       Therapy Documentation Precautions:  Precautions Precautions: Fall Precaution Comments: globally aphasic Restrictions Weight Bearing Restrictions: No Pain: Pain Assessment Pain Scale: Faces Faces Pain Scale: No hurt Critical Care Pain Observation  Tool (CPOT) Facial Expression: Relaxed, neutral Body Movements: Absence of movements Muscle Tension: Relaxed Compliance with ventilator (intubated pts.): N/A Vocalization (extubated pts.): Talking in normal tone or no sound CPOT Total: 0   Therapy/Group: Individual Therapy  ALorie Phenix12/30/2022, 3:57 PM

## 2021-07-04 NOTE — IPOC Note (Signed)
Overall Plan of Care [facility]) Patient Details Name: Shawna Hopkins MRN: [id number] DOB: 01-08-1928  Admitting Diagnosis: Left middle cerebral artery stroke [facility])  Hospital Problems: Principal Problem:   Left middle cerebral artery stroke [facility])     Functional Problem List: Nursing Bladder, Bowel, Medication Management, Safety, Endurance, Pain  PT Balance, Behavior, Endurance, Motor, Nutrition, Perception, Safety, Sensory, Skin Integrity  OT Balance, Sensory, Skin Integrity, Behavior, Cognition, Endurance, Motor, Perception, Safety  SLP Cognition, Nutrition  TR         Basic ADLs: OT Eating, Grooming, Bathing, Dressing, Toileting     Advanced  ADLs: OT       Transfers: PT Bed Mobility, Bed to Chair, Car, [employer], Floor  OT Tub/Shower, Technical brewer: PT Psychologist, prison and probation services, Stairs, Ambulation     Additional Impairments: OT Fuctional Use of Upper Extremity  SLP Swallowing, Communication, Social Cognition expression, comprehension Attention  TR      Anticipated Outcomes Item Anticipated Outcome  Self Feeding S  Swallowing  sup A   Basic self-care  S  Toileting  S   Bathroom Transfers S  Bowel/Bladder  manage bowel with mod I and bladder with min assist  Transfers  Supevrision assist with LRAD  Locomotion  Supevision assist with LRAD ambulatory  Communication  min A  Cognition  min A  Pain  pain at or below level 4 with prn  Safety/Judgment  maintain w cues   Therapy Plan: PT Intensity: Minimum of 1-2 x/day ,45 to 90 minutes PT Frequency: 5 out of 7 days PT Duration Estimated Length of Stay: 10-14 days OT Intensity: Minimum of 1-2 x/day, 45 to 90 minutes OT Frequency: 5 out of 7 days OT Duration/Estimated Length of Stay: 2-2.5 weeks SLP Intensity: Minumum of 1-2 x/day, 30 to 90 minutes SLP Frequency: 3 to 5 out of 7 days SLP Duration/Estimated Length of Stay: 2-2.5 weeks   Due to the current state of emergency, patients may not be  receiving their 3-hours of Medicare-mandated therapy.   Team Interventions: Nursing Interventions Bladder Management, Disease Management/Prevention, Medication Management, Discharge Planning, Pain Management, Bowel Management, Patient/Family Education  PT interventions Ambulation/gait training, Warden/ranger, Cognitive remediation/compensation, Discharge planning, Community reintegration, DME/adaptive equipment instruction, Functional mobility training, Disease management/prevention, Patient/family education, Pain management, Neuromuscular re-education, Psychosocial support, Functional electrical stimulation, Splinting/orthotics, Therapeutic Exercise, Skin care/wound management, Therapeutic Activities, Stair training, UE/LE Strength taining/ROM, UE/LE Coordination activities, Visual/perceptual remediation/compensation, Wheelchair propulsion/positioning  OT Interventions Balance/vestibular training, Disease mangement/prevention, Neuromuscular re-education, Self Care/advanced ADL retraining, Therapeutic Exercise, Wheelchair propulsion/positioning, UE/LE Strength taining/ROM, Skin care/wound managment, Pain management, DME/adaptive equipment instruction, Cognitive remediation/compensation, Community reintegration, Functional electrical stimulation, UE/LE Coordination activities, Splinting/orthotics, Patient/family education, Discharge planning, Functional mobility training, Psychosocial support, Therapeutic Activities, Visual/perceptual remediation/compensation  SLP Interventions Internal/external aids, Multimodal communication approach, Speech/Language facilitation, Cueing hierarchy, Functional tasks, Patient/family education, Therapeutic Activities  TR Interventions    SW/CM Interventions Discharge Planning, Psychosocial Support, Patient/Family Education, Disease Management/Prevention   Barriers to Discharge MD  Medical stability  Nursing Decreased caregiver support, Incontinence 1 level  2ste solo PTA; family provided groceries and transportation  PT Inaccessible home environment, Decreased caregiver support, Home environment access/layout, Medication compliance, Lack of/limited family support, Behavior    OT Inaccessible home environment, Decreased caregiver support, Behavior    SLP      SW       Team Discharge Planning: Destination: PT-Home ,OT- Home , SLP-Home Projected Follow-up: PT-Home health PT, OT-  Home health OT, 24 hour supervision/assistance, SLP-24 hour  supervision/assistance, Other (comment) (TBD) Projected Equipment Needs: PT-Rolling walker with 5" wheels, To be determined, OT- To be determined, SLP-None recommended by SLP Equipment Details: PT- , OT-  Patient/family involved in discharge planning: PT- Patient, Family member/caregiver,  OT-Patient unable/family or caregiver not available, SLP-Patient unable/family or caregive not available  MD ELOS: 16-18 days Medical Rehab Prognosis:  Excellent Assessment: Shawna Hopkins is a 87 year old woman admitted to CIR with functional deficits left-sided weakness and aphasia secondary to left MCA infarct with hemorrhagic conversion, embolic pattern likely due to new diagnosis of a flutter. Medications are being managed, and labs and vitals are being monitored regularly.     See Team Conference Notes for weekly updates to the plan of care

## 2021-07-05 MED ORDER — LISINOPRIL 5 MG PO TABS
2.5000 mg | ORAL_TABLET | Freq: Every day | ORAL | Status: DC
  Administered 2021-07-05 – 2021-07-09 (×5): 2.5 mg via ORAL
  Filled 2021-07-05 (×5): qty 1

## 2021-07-05 NOTE — Progress Notes (Signed)
Speech Language Pathology Daily Session Note  Patient Details  Name: SHYVONNE CHASTANG MRN: [id number] Date of Birth: 09/23/1927  Today's Date: 07/05/2021 SLP Individual Time: 1431-1500 SLP Individual Time Calculation (min): 29 min  Short Term Goals: Week 1: SLP Short Term Goal 1 (Week 1): Patient will consume current diet without overt s/sx of aspiration and mod A cues for use of swallowing compensatory strategies SLP Short Term Goal 2 (Week 1): Patient will attend to basic, famliar tasks for 3 minute intervals with max A verbal cues for redirection SLP Short Term Goal 3 (Week 1): Patient will follow basic one-step commands in 50% of opportunities with max A multimodal cues SLP Short Term Goal 4 (Week 1): Patient will respond to yes/no questions regarding basic wants/needs during 50% of occasions with max A multimodal cues SLP Short Term Goal 5 (Week 1): Patient will identify objects in field of 2 with max A verbal/visual cues SLP Short Term Goal 6 (Week 1): Patient will make vocalizations during language tasks (automatic speech) with max A multimodal cues  Skilled Therapeutic Interventions: Pt seen for skilled ST with focus on speech and swallow goals, pt in bed and agreeable to therapeutic tasks. Pt attempting to communicate via verbal/nonverbal means this date, demonstrating increased verbalizations ("well...", "and she said", "Debbie", "twelve" and "daughters"). Pt and SLP singing "Happy Birthday" and "Jingle Bells" with patient producing or approximating words 75% of the time. Pt unable to state biographical information despite max multimodal cues. Pt nodding head yes/shaking head no to basic yes/no questions on 75% opportunities. Pt agreeable to Dys 2 trials, independent for very small bites and slow rate. Pt with extended, slow mastications of solids, no overt s/s aspiration. Will continue to trial to determine tolerance and fatigue factor. Pt left in bed with alarm set and needs within  reach, indicating with gestures for SLP to keep the door open. Cont ST POC.   Pain Pain Assessment Pain Scale: 0-10 Pain Score: 0-No pain  Therapy/Group: Individual Therapy  Tacey Ruiz 07/05/2021, 2:55 PM

## 2021-07-05 NOTE — Progress Notes (Signed)
Occupational Therapy Session Note  Patient Details  Name: Shawna Hopkins MRN: [id number] Date of Birth: 1927-11-16  Today's Date: 07/05/2021 OT Individual Time: 3845-3646 OT Individual Time Calculation (min): 68 min + 24 min   Short Term Goals: Week 1:  OT Short Term Goal 1 (Week 1): Pt will don shirt with mod A. OT Short Term Goal 2 (Week 1): Pt will complete toilet transfer with mod A. OT Short Term Goal 3 (Week 1): Pt will complete grooming routine at sink with min A. OT Short Term Goal 4 (Week 1): Pt will don pants with max A.  Skilled Therapeutic Interventions/Progress Updates:    Session 1 [phone]): Pt received semi-reclined in bed, no s/sx pain and appears agreeable to therapy. Session focus on self-care retraining, activity tolerance, transfer retraining, functional cognition in prep for improved ADL/IADL/func mobility performance + decreased caregiver burden. Came to sitting EOB with close S and visual cues for initiation. Short ambulatory transfer > w/c at sink with RW and CGA for balance. Pt able to complete UB bathing with S seated at sink, doffed shirt with min A to pull over head and able to don new shirt with close S. Completed LB bathing with CGA to min A for balance to reach feet and able to doff pants with min A to pull down over hips and donned new pants with min A to thread RLE. Completed grooming tasks seated at sink including hair care, applying make-up, oral care with distant S. Pt able to doff/don B gripper socks with S to CGA due to reaching over, as well. Total A w/c transport to and from gym for time management and energy conservation.  Pt participated in [place] and making ornament to facilitate leisure participation, command follow, functional cognition. Pt able to copy short sentences into card appropriately when given visual cue, but unable to initiate writing own words in card. Pt excitedly pointing to her name when recognized it written down.  Pt initially requiring max visual and tactile cues to problem solve using glue, then progressing to only min verbal cues and S. Did not initiate crafting own ornament when given visual guide, but able to assist therapist and appropriately problem solve how to close.  Short ambulatory transfer > bed with min HHA. Returned to supine with S.  Pt left semi-reclined in bed with bed alarm engaged, call bell in reach, and all immediate needs met.    Session 2 [id number]): Pt received semi-reclined in bed, no s/sx pain throughout session, agreeable to therapy. Session focus on self-care retraining, activity tolerance, dynamic standing balance, safety awareness, transfer retraining in prep for improved ADL/IADL/func mobility performance + decreased caregiver burden. Came to sitting EOB with close S. Completed ambulatory toilet transfer with RW and CGA, multiple attempts for sit to stand due to posterior bias. Completed toileting tasks with close S post continent void of bladder with use of grab bars. Washed hands at sink with close S and cues to locate soap/paper towels. Ambulated down to hospital atrium with close S and intermittent CGA for RW management. Pt avoid hallway obstacles and aware to avoid collision of other people in hallway. Required seated rest break as pt visibly expressing fatigue. Required max visual cues to find way back to room, but able to recognize room as her home. Returned to S with S and able to communicate bed repositioning needs via facial expressions.  Pt left semi-reclined in bed with bed alarm engaged, call bell in reach, and  all immediate needs met.    Therapy Documentation Precautions:  Precautions Precautions: Fall Precaution Comments: globally aphasic Restrictions Weight Bearing Restrictions: No  Pain:no s/sx throughout session ADL: See Care Tool for more details.   Therapy/Group: Individual Therapy  Volanda Napoleon MS, OTR/L  07/05/2021, 6:51 AM

## 2021-07-05 NOTE — Progress Notes (Signed)
Physical Therapy Session Note  Patient Details  Name: Shawna Hopkins MRN: [id number] Date of Birth: 31-May-1928  Today's Date: 07/05/2021 PT Individual Time: 0051-1021 PT Individual Time Calculation (min): 19 min   Short Term Goals: Week 1:  PT Short Term Goal 1 (Week 1): Pt will ambulate with RW >132f with min assist PT Short Term Goal 2 (Week 1): [facility] PT Short Term Goal 3 (Week 1): Pt will ascend 4 steps with BUE support and min assist   Skilled Therapeutic Interventions/Progress Updates:   Pt received supine in bed asleep. Aroused easily to verbal stimuli. When asked if she was willing to get out of bed, pt continually shaking head. Attempted to assist pt with sit>supine transfer to EOB, but pt actively resisting. Rolling in bed from R to L with supervision assist with awareness of bed features.  PT encouraged use of restroom while in room, but pt continued to shake head no and close eyes when not engaged in conversation. Therapeuctic use of self to encourage movement and education for need for out of bed mobility without successPt left supine in bed with call bell in reach and all needs met.      Therapy Documentation Precautions:  Precautions Precautions: Fall Precaution Comments: globally aphasic Restrictions Weight Bearing Restrictions: No General: PT Amount of Missed Time (min): 41 Minutes PT Missed Treatment Reason: Patient fatigue;Patient unwilling to participate  Pain:   denies    Therapy/Group: Individual Therapy  ALorie Phenix12/31/2022, 9:38 AM

## 2021-07-05 NOTE — Progress Notes (Signed)
PROGRESS NOTE   Subjective/Complaints: No new complaints this morning Sleeping Missed therapy today due to unwillingness to participate.  +fatigue  ROS: Denies pain (facial scale), +fatigue  Objective:   No results found. No results for input(s): WBC, HGB, HCT, PLT in the last 72 hours.  Recent Labs    07/03/21 0515  NA 139  K 3.6  CL 106  CO2 24  GLUCOSE 117*  BUN 31*  CREATININE 0.92  CALCIUM 8.8*    Intake/Output Summary (Last 24 hours) at 07/05/2021 1149 Last data filed at 07/05/2021 0750 Gross per 24 hour  Intake 720 ml  Output --  Net 720 ml        Physical Exam: Vital Signs Blood pressure (!) 185/85, pulse 95, temperature 98 F (36.7 C), temperature source Oral, resp. rate 18, height 5\' 5"  (1.651 m), weight 62.6 kg, SpO2 99 %. Gen: no distress, normal appearing HEENT: oral mucosa pink and moist, NCAT Cardio: Reg rate Chest: normal effort, normal rate of breathing Abd: soft, non-distended Ext: no edema Psych: pleasant, normal affect Musculoskeletal:     Cervical back: Normal range of motion. No rigidity.     Comments: 5/5 in LUE/LLE Considering difficulty testing RUE/RLE- at least 4/5- but couldn't comply with entire exam  Skin:    General: Skin is warm and dry.  Neurological:     Mental Status: She is alert.     Comments: Patient is alert.  No acute distress.  She is aphasic.  She would not follow commands but mimic some actions.   Pt was trying to talk- couldn't make sounds- wrote legible words- go home on paper- following directions much better- still nonverbal Followed instructions ~ 60-70% of time Appears to have decreased sensation to light touch on R side per pt shaking head no-answered the same info if negative or positive answer- so think correct  Psychiatric:     Comments: Sleepy esp after exam   Assessment/Plan: 1. Functional deficits which require 3+ hours per day of  interdisciplinary therapy in a comprehensive inpatient rehab setting. Physiatrist is providing close team supervision and 24 hour management of active medical problems listed below. Physiatrist and rehab team continue to assess barriers to discharge/monitor patient progress toward functional and medical goals  Care Tool:  Bathing    Body parts bathed by patient: Right arm, Left arm, Chest, Abdomen, Right upper leg, Left upper leg, Right lower leg, Face, Left lower leg   Body parts bathed by helper: Face, Right arm, Left arm, Chest, Abdomen, Front perineal area, Buttocks, Right upper leg, Left upper leg, Right lower leg, Left lower leg     Bathing assist Assist Level: Minimal Assistance - Patient > 75%     Upper Body Dressing/Undressing Upper body dressing   What is the patient wearing?: Pull over shirt    Upper body assist Assist Level: Minimal Assistance - Patient > 75%    Lower Body Dressing/Undressing Lower body dressing      What is the patient wearing?: Pants     Lower body assist Assist for lower body dressing: Minimal Assistance - Patient > 75%      assist Assist for toileting: Contact Guard/Touching assist     Transfers Chair/bed transfer  Transfers assist  Chair/bed transfer activity did not occur: Safety/medical concerns  Chair/bed transfer assist level: Minimal Assistance - Patient > 75%     Locomotion Ambulation   Ambulation assist      Assist level: Minimal Assistance - Patient > 75% Assistive device: Walker-rolling Max distance: 20   Walk 10 feet activity   Assist     Assist level: Minimal Assistance - Patient > 75%     Walk 50 feet activity   Assist Walk 50 feet with 2 turns activity did not occur: Refused         Walk 150 feet activity   Assist Walk 150 feet activity did not occur: Refused         Walk 10 feet on uneven surface  activity   Assist Walk 10 feet on uneven surfaces  activity did not occur: Refused         Wheelchair     Assist Is the patient using a wheelchair?: Yes Type of Wheelchair: Manual Wheelchair activity did not occur: Refused         Wheelchair 50 feet with 2 turns activity    Assist    Wheelchair 50 feet with 2 turns activity did not occur: Refused       Wheelchair 150 feet activity     Assist  Wheelchair 150 feet activity did not occur: Refused       Blood pressure (!) 185/85, pulse 95, temperature 98 F (36.7 C), temperature source Oral, resp. rate 18, height 5\' 5"  (1.651 m), weight 62.6 kg, SpO2 99 %.    Medical Problem List and Plan: 1. Functional deficits left-sided weakness and aphasia secondary to left MCA infarct with hemorrhagic conversion, embolic pattern likely due to new diagnosis of a flutter             -patient may  shower             -ELOS/Goals: 16-18 days- min A PT/OT and mod A SLP  Continue CIR 2.  Impaired mobility: continue Lovenox             -antiplatelet therapy: Aspirin 81 mg daily.  Considering DOAC once hemorrhage resolved. 3. Pain Management: Tylenol as needed 4. Mood: Provide emotional support             -antipsychotic agents: N/A 5. Neuropsych: This patient is not capable of making decisions on her own behalf. 6. Skin/Wound Care: Routine skin checks 7. Fluids/Electrolytes/Nutrition: Routine in and outs with follow-up chemistries 8.  HTN:  start lisinopril 2.5mg  and titrate up as needed this week. 9.  New diagnosis a flutter.  Rate controlled.  Continue aspirin. 10.  Incidental finding cerebral aneurysm.  Follow-up outpatient interventional radiology 11.  Borderline enlarged right tracheal bronchial mediastinal lymph node.  Consider CT chest as outpatient. 12. Lethargy: likely secondary to Xanax: d/ced Xanax. Resolved. Start B/C vitamin complex to promote stroke recovery 13 Prediabetic- is new dx- A1c 6.0- due to age, will see if team thinks should be aggressive on this. Place  nursing order to provide only water- no drinks with added sugars 14. UTI: continue keflex 7 days Start probiotic. UC with >100,000 Ecoli pansensitive 15. Sundowning at night: likely secondary to UTI, improved: d/c Seroquel    LOS: 4 days A FACE TO FACE EVALUATION WAS PERFORMED  Shawna Hopkins 07/05/2021, 11:49 AM

## 2021-07-06 MED ORDER — MELATONIN 3 MG PO TABS
3.0000 mg | ORAL_TABLET | Freq: Every day | ORAL | Status: DC
  Administered 2021-07-06: 3 mg via ORAL
  Filled 2021-07-06: qty 1

## 2021-07-06 NOTE — Progress Notes (Signed)
PROGRESS NOTE   Subjective/Complaints: Aphasic, alert, follows basic commands RN reported poor sleep overnight- melatonin 3mg  started.   ROS: Denies pain (facial scale), +fatigue  Objective:   No results found. No results for input(s): WBC, HGB, HCT, PLT in the last 72 hours.  No results for input(s): NA, K, CL, CO2, GLUCOSE, BUN, CREATININE, CALCIUM in the last 72 hours.   Intake/Output Summary (Last 24 hours) at 07/06/2021 1924 Last data filed at 07/06/2021 1819 Gross per 24 hour  Intake 510 ml  Output --  Net 510 ml        Physical Exam: Vital Signs Blood pressure 132/73, pulse 95, temperature 98.5 F (36.9 C), temperature source Oral, resp. rate 18, height 5\' 5"  (1.651 m), weight 62.6 kg, SpO2 97 %. Gen: no distress, normal appearing HEENT: oral mucosa pink and moist, NCAT Cardio: Reg rate Chest: normal effort, normal rate of breathing Abd: soft, non-distended Ext: no edema Psych: pleasant, normal affect Musculoskeletal:     Cervical back: Normal range of motion. No rigidity.     Comments: 5/5 in LUE/LLE Considering difficulty testing RUE/RLE- at least 4/5- but couldn't comply with entire exam  Skin:    General: Skin is warm and dry.  Neurological:     Mental Status: She is alert.     Comments: Patient is alert.  No acute distress.  She is aphasic.  She would not follow commands but mimic some actions.   Pt was trying to talk- couldn't make sounds- wrote legible words- go home on paper- following directions much better- still nonverbal Followed instructions ~ 60-70% of time Appears to have decreased sensation to light touch on R side per pt shaking head no-answered the same info if negative or positive answer- so think correct  Psychiatric:     Comments: Sleepy esp after exam   Assessment/Plan: 1. Functional deficits which require 3+ hours per day of interdisciplinary therapy in a comprehensive inpatient  rehab setting. Physiatrist is providing close team supervision and 24 hour management of active medical problems listed below. Physiatrist and rehab team continue to assess barriers to discharge/monitor patient progress toward functional and medical goals  Care Tool:  Bathing    Body parts bathed by patient: Right arm, Left arm, Chest, Abdomen, Right upper leg, Left upper leg, Right lower leg, Face, Left lower leg   Body parts bathed by helper: Face, Right arm, Left arm, Chest, Abdomen, Front perineal area, Buttocks, Right upper leg, Left upper leg, Right lower leg, Left lower leg     Bathing assist Assist Level: Minimal Assistance - Patient > 75%     Upper Body Dressing/Undressing Upper body dressing   What is the patient wearing?: Pull over shirt    Upper body assist Assist Level: Minimal Assistance - Patient > 75%    Lower Body Dressing/Undressing Lower body dressing      What is the patient wearing?: Pants     Lower body assist Assist for lower body dressing: Minimal Assistance - Patient > 75%     Toileting Toileting    Toileting assist Assist for toileting: Supervision/Verbal cueing     Transfers Chair/bed transfer  Transfers assist  Chair/bed  transfer activity did not occur: Safety/medical concerns  Chair/bed transfer assist level: Minimal Assistance - Patient > 75%     Locomotion Ambulation   Ambulation assist      Assist level: Minimal Assistance - Patient > 75% Assistive device: Walker-rolling Max distance: 20   Walk 10 feet activity   Assist     Assist level: Minimal Assistance - Patient > 75%     Walk 50 feet activity   Assist Walk 50 feet with 2 turns activity did not occur: Refused         Walk 150 feet activity   Assist Walk 150 feet activity did not occur: Refused         Walk 10 feet on uneven surface  activity   Assist Walk 10 feet on uneven surfaces activity did not occur: Refused          Wheelchair     Assist Is the patient using a wheelchair?: Yes Type of Wheelchair: Manual Wheelchair activity did not occur: Refused         Wheelchair 50 feet with 2 turns activity    Assist    Wheelchair 50 feet with 2 turns activity did not occur: Refused       Wheelchair 150 feet activity     Assist  Wheelchair 150 feet activity did not occur: Refused       Blood pressure 132/73, pulse 95, temperature 98.5 F (36.9 C), temperature source Oral, resp. rate 18, height 5\' 5"  (1.651 m), weight 62.6 kg, SpO2 97 %.    Medical Problem List and Plan: 1. Functional deficits left-sided weakness and aphasia secondary to left MCA infarct with hemorrhagic conversion, embolic pattern likely due to new diagnosis of a flutter             -patient may  shower             -ELOS/Goals: 16-18 days- min A PT/OT and mod A SLP  Continue CIR 2.  Impaired mobility: continue Lovenox             -antiplatelet therapy: Aspirin 81 mg daily.  Considering DOAC once hemorrhage resolved. 3. Pain Management: Tylenol as needed 4. Mood: Provide emotional support             -antipsychotic agents: N/A 5. Neuropsych: This patient is not capable of making decisions on her own behalf. 6. Skin/Wound Care: Routine skin checks 7. Fluids/Electrolytes/Nutrition: Routine in and outs with follow-up chemistries 8.  HTN:  continue lisinopril 2.5mg  and titrate up as needed this week. 9.  New diagnosis a flutter.  Rate controlled.  Continue aspirin. 10.  Incidental finding cerebral aneurysm.  Follow-up outpatient interventional radiology 11.  Borderline enlarged right tracheal bronchial mediastinal lymph node.  Consider CT chest as outpatient. 12. Lethargy: likely secondary to Xanax: d/ced Xanax. Resolved. Start B/C vitamin complex to promote stroke recovery 13 Prediabetic- is new dx- A1c 6.0- due to age, will see if team thinks should be aggressive on this. Place nursing order to provide only water- no  drinks with added sugars 14. UTI: continue keflex 7 days Start probiotic. UC with >100,000 Ecoli pansensitive 15. Sundowning at night: likely secondary to UTI, improved: d/c Seroquel 16. Insomnia: start melatonin 3mg  HS    LOS: 5 days A FACE TO FACE EVALUATION WAS PERFORMED  Clide Deutscher [place] Dalporto 07/06/2021, 7:24 PM

## 2021-07-07 MED ORDER — METOPROLOL TARTRATE 50 MG PO TABS
50.0000 mg | ORAL_TABLET | Freq: Two times a day (BID) | ORAL | Status: DC
  Administered 2021-07-07 – 2021-07-12 (×10): 50 mg via ORAL
  Filled 2021-07-07 (×10): qty 1

## 2021-07-07 MED ORDER — QUETIAPINE FUMARATE 25 MG PO TABS
25.0000 mg | ORAL_TABLET | Freq: Every day | ORAL | Status: DC
  Administered 2021-07-07 – 2021-07-11 (×5): 25 mg via ORAL
  Filled 2021-07-07 (×5): qty 1

## 2021-07-07 NOTE — Progress Notes (Signed)
Physical Therapy Session Note  Patient Details  Name: Shawna Hopkins MRN: [id number] Date of Birth: 1928-04-12  Today's Date: 07/07/2021 PT Individual Time: 0901-1000 PT Individual Time Calculation (min): 59 min   Short Term Goals: Week 1:  PT Short Term Goal 1 (Week 1): Pt will ambulate with RW >173ft with min assist PT Short Term Goal 2 (Week 1): Pt will transfer to and from [facility] with CGA and LRAD PT Short Term Goal 3 (Week 1): Pt will ascend 4 steps with BUE support and min assist   Skilled Therapeutic Interventions/Progress Updates:  Patient supine in bed on entrance to room completing session with SLP. Patient alert and agreeable to PT session.   Patient with no pain complaint throughout session.  Therapeutic Activity: Bed Mobility: Patient performed supine <> sit with supervision and attempting to scoot toward opening of bedrail at end of bed. VC/ tc required for safety/ technique. Mod A for donning socks and pt able to don shoes with setup. Transfers: Patient performed sit<>stand and stand pivot transfers throughout session with close supervision. Pt consistently reaching out for RW to assist with rise to stand requiring vc/ tc for hand placement to perform push-to-stand technique as well as safe progression to walker hand grips. Toilet transfer performed with CGA. Supervision provided for doffing/ donning pants to maintain balance.   Gait Training:  Patient ambulated 301 ft using RW with CGA in order to complete 6MWT. Demonstrated slow pace with improving step length bilaterally. Provided vc/ tc for attn to task and directionality. 10 meter walk test completed with pt completing in 43min 12 sec for walking speed of 0.139 m/sec.   Neuromuscular Re-ed: NMR facilitated during session with focus on standing balance, coordination, and proprioception. Pt guided in toe touches to cones placed anteriorly. Pt demos difficulty following verbal instructions and attempting initially to pick up  cones with hands. Requires consistent cueing with vc/ tc for toe touches. Requires BUE support to RW to complete. Unable to follow instructions for decreasing [place] support to unilateral support.  NMR performed for improvements in motor control and coordination, balance, sequencing, judgement, and self confidence/ efficacy in performing all aspects of mobility at highest level of independence.   Patient seated upright in recliner at end of session with brakes locked, seat pad alarm set, and all needs within reach. Dtr setting pt up to curl her hair.      Therapy Documentation Precautions:  Precautions Precautions: Fall Precaution Comments: globally aphasic Restrictions Weight Bearing Restrictions: No General:   Pain: Pain Assessment Pain Scale: 0-10 Faces Pain Scale: No hurt  Therapy/Group: Individual Therapy  Alger Simons PT, DPT 07/07/2021, 10:04 AM

## 2021-07-07 NOTE — Progress Notes (Signed)
Occupational Therapy Session Note  Patient Details  Name: Shawna Hopkins MRN: [id number] Date of Birth: 1927/07/19  Today's Date: 07/07/2021 OT Individual Time: 3007-6226 & 3335-4562 OT Individual Time Calculation (min): 27 min & 38 min   Short Term Goals: Week 1:  OT Short Term Goal 1 (Week 1): Pt will don shirt with mod A. OT Short Term Goal 2 (Week 1): Pt will complete toilet transfer with mod A. OT Short Term Goal 3 (Week 1): Pt will complete grooming routine at sink with min A. OT Short Term Goal 4 (Week 1): Pt will don pants with max A.  Skilled Therapeutic Interventions/Progress Updates:  Session 1 Skilled OT intervention completed with focus on activity tolerance, spatial awareness/cognitive strategies during informal writing assessment. Pt received seated in recliner, with family in room, with pt initially nodding "no" at therapist during therapist introduction, however family encouraging pt to participate with pt nodding "yes". Pt reaching for note-pad handing to therapist with therapist assessing pt's communication skills via writing. Pt able to copy her name, and able to copy the sentence "are you tired?" (Per pt yawning), however pt unable to select "yes" or "no" as a response vs just copying therapist's note. Pt verbally trying to express throughout session, and demonstrated slight frustration when unable to express but no agitation noted this session. Pt able to copy simple square with correct orientation and spatial set up, however increased difficulty with more complex shapes, as pt often not re-producing correct size/lines of space in each object. Pt with decreased arousal during session, occasionally nodding off, with therapist offering blanket to pt and pt nodding "yes". Pt blowing a kiss at therapist when exiting room. Pt left seated in recliner, with chair alarm on, call bell in reach, and all other needs met at end of session.  Session 2 Skilled OT intervention completed  with focus on toileting, functional transfers, sequencing within card game, and activity tolerance. Pt received seated in recliner, asleep, but easily woken. Pt non-verbally pointing towards bathroom, with therapist confirming with pt if she needed to use restroom with pt head nodding "yes." Pt sit > stand with CGA using RW, then short ambulatory transfer to [facility] over toilet in bathroom with min A to orient to the commode as pt attempted to leave RW behind/completely sideways in stance with tactile and directional pointing cues needed for comprehension of commands. Pt with facial expression of discomfort, however no void/BM present in commode with pt self-initiating wiping. Pt sit > stand with CGA using, then donned pants with CGA for balance. Short ambulatory transfer to recliner in room with CGA while using RW. Encouraged out of room activities, but pt shaking head "no" and agreeable to task in recliner when offered. While seated, pt engaged in card activity using "go fish" cards, with pt able place cards in correct pile when matching with increased time. Pt able to correctly orient card, when handed upside down card for accuracy with matching. Pt with min difficulty with sequencing task of placing cards in specific order. Pt with decreased arousal during session, falling asleep often, with pt easily startled with light tactile input on arm to waken. Asked pt to point to "blue card" with pt with confusion, however able to correctly point to the "fish" vs "seahorse" pictured card. Pt opted to stay in recliner, with chair alarm on and call bell within reach at end of session.    Therapy Documentation Precautions:  Precautions Precautions: Fall Precaution Comments: globally aphasic Restrictions Weight  Bearing Restrictions: No  Pain: No expressed or visual sign of pain   Therapy/Group: Individual Therapy  Renner Sebald E Yanelli Zapanta 07/07/2021, 7:52 AM

## 2021-07-07 NOTE — Progress Notes (Signed)
Speech Language Pathology Daily Session Note  Patient Details  Name: Shawna Hopkins MRN: [id number] Date of Birth: [birth year]  Today's Date: 07/07/2021 SLP Individual Time: 0830-0900 and 1446-1510 SLP Individual Time Calculation (min): 30 min and 24 minutes (20 minutes missed time due to fatigue)  Short Term Goals: Week 1: SLP Short Term Goal 1 (Week 1): Patient will consume current diet without overt s/sx of aspiration and mod A cues for use of swallowing compensatory strategies SLP Short Term Goal 2 (Week 1): Patient will attend to basic, famliar tasks for 3 minute intervals with max A verbal cues for redirection SLP Short Term Goal 3 (Week 1): Patient will follow basic one-step commands in 50% of opportunities with max A multimodal cues SLP Short Term Goal 4 (Week 1): Patient will respond to yes/no questions regarding basic wants/needs during 50% of occasions with max A multimodal cues SLP Short Term Goal 5 (Week 1): Patient will identify objects in field of 2 with max A verbal/visual cues SLP Short Term Goal 6 (Week 1): Patient will make vocalizations during language tasks (automatic speech) with max A multimodal cues  Skilled Therapeutic Interventions:  Session 1: Skilled ST treatment focused on communication goals. Patient exhibited improved spontaneous speech this date with improved ability to initiate verbal output. She responded to some open ended questions with single responses and attempts for phrase length. Pt continues to exhibit non-fluent speech with word finding difficulty, paraphasias, and perseveration. Patient completed automatic speech tasks with min-to-mod A to achieve 100% accuracy with counting 1-10 and days of the week. She perseverated on "ten" and counted when attempting to verbalize the months of the year. Patient was able to verbally repeat her first/last name with max A verbal/visual cues, as well as short phrase "my name is Shawna Hopkins." Patient was left in bed  with alarm activated and immediate needs within reach at end of session. Continue per current plan of care.    Session 2: Patient was sleeping on arrival and required moderate cues to rouse. Once awake, patient appeared drowsy and required occasional cueing to keep her eyes open. Pt agreeable to consume dys 2 PO trials. Patient exhibited mildly prolonged yet effective mastication, complete oral clearance, and no overt s/s of aspiration. Pt also consumed single and sequential sips of thin liquids by straw without s/s of aspiration. Recommend diet advancement to dys 2 textures, thin liquids. Following PO trials patient exhibited decreased alertness and participation. Session concluded 20 minutes early for this reason. Patient was left in bed with alarm activated and immediate needs within reach at end of session. Continue per current plan of care.  Pain Pain Assessment Pain Scale: 0-10 Faces Pain Scale: No hurt PAINAD (Pain Assessment in Advanced Dementia) Breathing: normal Negative Vocalization: none Facial Expression: smiling or inexpressive Body Language: relaxed Consolability: no need to console PAINAD Score: 0  Therapy/Group: Individual Therapy  Shawna Hopkins 07/07/2021, 10:02 AM

## 2021-07-07 NOTE — Progress Notes (Signed)
Pt OOB, refusing going back to bed. Staff next to her.

## 2021-07-07 NOTE — Progress Notes (Signed)
Pt alert, increase confusion at bedtime. Pt still awake, sitting on recliner, refusing to go back to bed. Pt packed all her belongings, unable to re-orient to time and place. Staff sitting next to her, unable to put chair alarm or waist alarm, gets agitated and trying to hit. No acute distress.

## 2021-07-07 NOTE — Progress Notes (Signed)
PROGRESS NOTE   Subjective/Complaints:  Sundowning started ~9p last noc, poor sleep (fell asleep ~5am)    ROS: Denies pain (facial scale), +fatigue  Objective:   No results found. No results for input(s): WBC, HGB, HCT, PLT in the last 72 hours.  No results for input(s): NA, K, CL, CO2, GLUCOSE, BUN, CREATININE, CALCIUM in the last 72 hours.   Intake/Output Summary (Last 24 hours) at 07/07/2021 [id number] Last data filed at 07/06/2021 1819 Gross per 24 hour  Intake 150 ml  Output --  Net 150 ml         Physical Exam: Vital Signs Blood pressure (!) 170/115, pulse (!) 108, temperature 97.8 F (36.6 C), temperature source Oral, resp. rate 18, height 5\' 5"  (1.651 m), weight 62.6 kg, SpO2 99 %.  General: No acute distress Mood and affect are appropriate Heart: mild tachy, irreg irreg  no rubs murmurs or extra sounds Lungs: Clear to auscultation, breathing unlabored, no rales or wheezes Abdomen: Positive bowel sounds, soft nontender to palpation, nondistended Extremities: No clubbing, cyanosis, or edema Skin: No evidence of breakdown, no evidence of rash   Musculoskeletal:     Cervical back: Normal range of motion. No rigidity.     Comments: 5/5 in LUE/LLE Considering difficulty testing RUE/RLE- at least 4/5- but couldn't comply with entire exam  Skin:    General: Skin is warm and dry.  Neurological:     Mental Status: She is alert.     Comments: Patient is alert.  No acute distress.  She is aphasic.  She would not follow commands but mimic some actions.    Psychiatric:     Comments: Sleepy esp after exam   Assessment/Plan: 1. Functional deficits which require 3+ hours per day of interdisciplinary therapy in a comprehensive inpatient rehab setting. Physiatrist is providing close team supervision and 24 hour management of active medical problems listed below. Physiatrist and rehab team continue to assess barriers to  discharge/monitor patient progress toward functional and medical goals  Care Tool:  Bathing    Body parts bathed by patient: Right arm, Left arm, Chest, Abdomen, Right upper leg, Left upper leg, Right lower leg, Face, Left lower leg   Body parts bathed by helper: Face, Right arm, Left arm, Chest, Abdomen, Front perineal area, Buttocks, Right upper leg, Left upper leg, Right lower leg, Left lower leg     Bathing assist Assist Level: Minimal Assistance - Patient > 75%     Upper Body Dressing/Undressing Upper body dressing   What is the patient wearing?: Pull over shirt    Upper body assist Assist Level: Minimal Assistance - Patient > 75%    Lower Body Dressing/Undressing Lower body dressing      What is the patient wearing?: Pants     Lower body assist Assist for lower body dressing: Minimal Assistance - Patient > 75%     Toileting Toileting    Toileting assist Assist for toileting: Supervision/Verbal cueing     Transfers Chair/bed transfer  Transfers assist  Chair/bed transfer activity did not occur: Safety/medical concerns  Chair/bed transfer assist level: Minimal Assistance - Patient > 75%     Locomotion Ambulation  Ambulation assist      Assist level: Minimal Assistance - Patient > 75% Assistive device: Walker-rolling Max distance: 20   Walk 10 feet activity   Assist     Assist level: Minimal Assistance - Patient > 75%     Walk 50 feet activity   Assist Walk 50 feet with 2 turns activity did not occur: Refused         Walk 150 feet activity   Assist Walk 150 feet activity did not occur: Refused         Walk 10 feet on uneven surface  activity   Assist Walk 10 feet on uneven surfaces activity did not occur: Refused         Wheelchair     Assist Is the patient using a wheelchair?: Yes Type of Wheelchair: Manual Wheelchair activity did not occur: Refused         Wheelchair 50 feet with 2 turns  activity    Assist    Wheelchair 50 feet with 2 turns activity did not occur: Refused       Wheelchair 150 feet activity     Assist  Wheelchair 150 feet activity did not occur: Refused       Blood pressure (!) 170/115, pulse (!) 108, temperature 97.8 F (36.6 C), temperature source Oral, resp. rate 18, height 5\' 5"  (1.651 m), weight 62.6 kg, SpO2 99 %.    Medical Problem List and Plan: 1. Functional deficits left-sided weakness and aphasia secondary to left MCA infarct with hemorrhagic conversion, embolic pattern likely due to new diagnosis of a flutter             -patient may  shower             -ELOS/Goals: 16-18 days- min A PT/OT and mod A SLP  Continue CIR 2.  Impaired mobility: continue Lovenox             -antiplatelet therapy: Aspirin 81 mg daily.  Considering DOAC once hemorrhage resolved. 3. Pain Management: Tylenol as needed 4. Mood: Provide emotional support             -antipsychotic agents: N/A 5. Neuropsych: This patient is not capable of making decisions on her own behalf. 6. Skin/Wound Care: Routine skin checks 7. Fluids/Electrolytes/Nutrition: Routine in and outs with follow-up chemistries 8.  HTN:  continue lisinopril 2.5mg  and titrate up as needed this week. Vitals:   07/06/21 1951 07/07/21 0314  BP: (!) 149/81 (!) 170/115  Pulse: (!) 104 (!) 108  Resp: 16 18  Temp: 98.3 F (36.8 C) 97.8 F (36.6 C)  SpO2: 99% 99%  HR up as is BP will increase  BB  9.  New diagnosis a flutter.  Rate controlled.  Continue aspirin. 10.  Incidental finding cerebral aneurysm.  Follow-up outpatient interventional radiology 11.  Borderline enlarged right tracheal bronchial mediastinal lymph node.  Consider CT chest as outpatient. 12. Lethargy: likely secondary to Xanax: d/ced Xanax. Resolved. Start B/C vitamin complex to promote stroke recovery 13 Prediabetic- is new dx- A1c 6.0- due to age, will see if team thinks should be aggressive on this. Place nursing order  to provide only water- no drinks with added sugars 14. UTI: continue keflex 7 days Start probiotic. UC with >100,000 Ecoli pansensitive 15. Sundowning at night: persists despite tx of UTI, did better with seroquel will resume qhs     LOS: 6 days A FACE TO FACE EVALUATION WAS PERFORMED  Charlett Blake 07/07/2021, 8:12  AM

## 2021-07-07 NOTE — Progress Notes (Signed)
Pt went back to bed , sleeping at this time.

## 2021-07-08 NOTE — Progress Notes (Signed)
Occupational Therapy Session Note  Patient Details  Name: Shawna Hopkins MRN: [id number] Date of Birth: 1928-03-20  Today's Date: 07/08/2021 OT Individual Time: 1030-1054 OT Individual Time Calculation (min): 24 min    Short Term Goals: Week 1:  OT Short Term Goal 1 (Week 1): Pt will don shirt with mod A. OT Short Term Goal 2 (Week 1): Pt will complete toilet transfer with mod A. OT Short Term Goal 3 (Week 1): Pt will complete grooming routine at sink with min A. OT Short Term Goal 4 (Week 1): Pt will don pants with max A.  Skilled Therapeutic Interventions/Progress Updates:    Pt resting in bed upon arrival. Pt initially waved, in a welcoming manner, to OTA but was resistant to attempts to sit EOB. With continuous encouragement and tactile cues, pt sat EOB with supervion. Sit<>stand X 4 with CGA. Pt followed simple commands with approx 50% accuracy during session. Pt returned to supine and remained in bed with bed alarm activated. All needs within reach.  Therapy Documentation Precautions:  Precautions Precautions: Fall Precaution Comments: globally aphasic Restrictions Weight Bearing Restrictions: No  Pain: Pain Assessment Pain Scale: Faces Pain Score: 0-No pain     Therapy/Group: Individual Therapy  Rich Brave 07/08/2021, 11:24 AM

## 2021-07-08 NOTE — Progress Notes (Addendum)
Physical Therapy Session Note  Patient Details  Name: Shawna Hopkins MRN: [id number] Date of Birth: [birth year]  Today's Date: 07/08/2021 PT Individual Time: 1115-1200 PT Individual Time Calculation (min): 45 min   Short Term Goals: Week 1:  PT Short Term Goal 1 (Week 1): Pt will ambulate with RW >132ft with min assist PT Short Term Goal 2 (Week 1): Pt will transfer to and from [facility] with CGA and LRAD PT Short Term Goal 3 (Week 1): Pt will ascend 4 steps with BUE support and min assist  Skilled Therapeutic Interventions/Progress Updates:  Patient supine in bed on entrance to room. Patient alert and agreeable to PT session. LPN in room and providing pt with laxative as she has not had BM in 4 days. Specific medication causes pt to cough for 25 minutes. Cannot rule out stomach irritation from medication causing coughing as pt also spitting out small pieces of food throughout into tissues. Pt observed for choking and assisted to toilet once coughing subsides.   Patient with no pain complaint throughout session, just irritation from coughing.   Therapeutic Activity: Bed Mobility: Patient performed supine <> sit with supervision. No vc required for technique. Transfers: Patient performed sit<>stand and stand pivot transfers throughout session with CGA for balance/ safety. Provided verbal/ visual cues for hand placement/ progression. Toilet transfer performed with close supervision. Pt continent of urine. Clothing mgmt and pericare also performed at supervision level of assist.   Gait Training:  Patient ambulated 150 ft using RW with CGA. Demonstrated continued improvement in R step height/ length and quality of stepping. Provided vc/ tc for maintaining proximity to RW, upright posture, and level gaze.  Patient seated upright  in recliner at end of session with brakes locked, seat pad alarm set, and all needs within reach. Tray table in front of pt in prep for lunch tray arrival. NT notified as to  pt's position.      Therapy Documentation Precautions:  Precautions Precautions: Fall Precaution Comments: globally aphasic Restrictions Weight Bearing Restrictions: No General:   Pain: Pain Assessment Pain Scale: Faces Pain Score: 0-No pain  Therapy/Group: Individual Therapy  Loel Dubonnet PT, DPT 07/08/2021, 12:49 PM

## 2021-07-08 NOTE — Progress Notes (Signed)
PROGRESS NOTE   Subjective/Complaints:  Remains aphasic   ROS: cannot obtain due to aphasia   Objective:   No results found. No results for input(s): WBC, HGB, HCT, PLT in the last 72 hours.  No results for input(s): NA, K, CL, CO2, GLUCOSE, BUN, CREATININE, CALCIUM in the last 72 hours.   Intake/Output Summary (Last 24 hours) at 07/08/2021 0723 Last data filed at 07/07/2021 1323 Gross per 24 hour  Intake 180 ml  Output --  Net 180 ml         Physical Exam: Vital Signs Blood pressure (!) 161/91, pulse 83, temperature 98 F (36.7 C), resp. rate 19, height 5\' 5"  (1.651 m), weight 62.6 kg, SpO2 99 %.   General: No acute distress Mood and affect are appropriate Heart: IRRegular rate and rhythm no rubs murmurs or extra sounds Lungs: Clear to auscultation, breathing unlabored, no rales or wheezes Abdomen: Positive bowel sounds, soft nontender to palpation, nondistended  Musculoskeletal:     Cervical back: Normal range of motion. No rigidity.     Comments: 5/5 in LUE/LLE Considering difficulty testing RUE/RLE- at least 4/5- but couldn't comply with entire exam  Skin:    General: Skin is warm and dry.  Neurological:     Mental Status: She is alert.     Comments: Patient is alert.  No acute distress.  She is aphasic.  She would not follow commands but mimic some actions.    Psychiatric:     Comments: Sleepy esp after exam   Assessment/Plan: 1. Functional deficits which require 3+ hours per day of interdisciplinary therapy in a comprehensive inpatient rehab setting. Physiatrist is providing close team supervision and 24 hour management of active medical problems listed below. Physiatrist and rehab team continue to assess barriers to discharge/monitor patient progress toward functional and medical goals  Care Tool:  Bathing    Body parts bathed by patient: Right arm, Left arm, Chest, Abdomen, Right upper leg,  Left upper leg, Right lower leg, Face, Left lower leg   Body parts bathed by helper: Face, Right arm, Left arm, Chest, Abdomen, Front perineal area, Buttocks, Right upper leg, Left upper leg, Right lower leg, Left lower leg     Bathing assist Assist Level: Minimal Assistance - Patient > 75%     Upper Body Dressing/Undressing Upper body dressing   What is the patient wearing?: Pull over shirt    Upper body assist Assist Level: Minimal Assistance - Patient > 75%    Lower Body Dressing/Undressing Lower body dressing      What is the patient wearing?: Pants     Lower body assist Assist for lower body dressing: Minimal Assistance - Patient > 75%     Toileting Toileting    Toileting assist Assist for toileting: Contact Guard/Touching assist     Transfers Chair/bed transfer  Transfers assist  Chair/bed transfer activity did not occur: Safety/medical concerns  Chair/bed transfer assist level: Minimal Assistance - Patient > 75%     Locomotion Ambulation   Ambulation assist      Assist level: Minimal Assistance - Patient > 75% Assistive device: Walker-rolling Max distance: 20   Walk 10 feet activity  Assist     Assist level: Minimal Assistance - Patient > 75%     Walk 50 feet activity   Assist Walk 50 feet with 2 turns activity did not occur: Refused         Walk 150 feet activity   Assist Walk 150 feet activity did not occur: Refused         Walk 10 feet on uneven surface  activity   Assist Walk 10 feet on uneven surfaces activity did not occur: Refused         Wheelchair     Assist Is the patient using a wheelchair?: Yes Type of Wheelchair: Manual Wheelchair activity did not occur: Refused         Wheelchair 50 feet with 2 turns activity    Assist    Wheelchair 50 feet with 2 turns activity did not occur: Refused       Wheelchair 150 feet activity     Assist  Wheelchair 150 feet activity did not occur:  Refused       Blood pressure (!) 161/91, pulse 83, temperature 98 F (36.7 C), resp. rate 19, height 5\' 5"  (1.651 m), weight 62.6 kg, SpO2 99 %.    Medical Problem List and Plan: 1. Functional deficits left-sided weakness and aphasia secondary to left MCA infarct with hemorrhagic conversion, embolic pattern likely due to new diagnosis of a flutter             -patient may  shower             -ELOS/Goals: 16-18 days- min A PT/OT and mod A SLP  Continue CIR 2.  Impaired mobility: continue Lovenox             -antiplatelet therapy: Aspirin 81 mg daily.  Considering DOAC once hemorrhage resolved. 3. Pain Management: Tylenol as needed 4. Mood: Provide emotional support             -antipsychotic agents: N/A 5. Neuropsych: This patient is not capable of making decisions on her own behalf. 6. Skin/Wound Care: Routine skin checks 7. Fluids/Electrolytes/Nutrition: Routine in and outs with follow-up chemistries 8.  HTN:  continue lisinopril 2.5mg  and titrate up as needed this week. Vitals:   07/07/21 1933 07/08/21 0443  BP: (!) 141/71 (!) 161/91  Pulse: 85 83  Resp: 18 19  Temp: 98 F (36.7 C)   SpO2: 97% 99%  HR improving , may need further titration to control BP as well   9.  New diagnosis a flutter.  Rate controlled.  Continue aspirin. 10.  Incidental finding cerebral aneurysm.  Follow-up outpatient interventional radiology 11.  Borderline enlarged right tracheal bronchial mediastinal lymph node.  Consider CT chest as outpatient. 12. Lethargy: likely secondary to Xanax: d/ced Xanax. Resolved. Start B/C vitamin complex to promote stroke recovery 13 Prediabetic- is new dx- A1c 6.0- due to age, will see if team thinks should be aggressive on this. Place nursing order to provide only water- no drinks with added sugars 14. UTI: continue keflex 7 days Start probiotic. UC with >100,000 Ecoli pansensitive 15. Sundowning at night: persists despite tx of UTI, did better with seroquel will  resume qhs- monitor for am somnolence      LOS: 7 days A FACE TO FACE EVALUATION WAS PERFORMED  09/05/21 07/08/2021, 7:23 AM

## 2021-07-08 NOTE — Progress Notes (Signed)
Pt slept through the night.

## 2021-07-08 NOTE — Progress Notes (Signed)
Occupational Therapy Session Note  Patient Details  Name: Shawna Hopkins MRN: [id number] Date of Birth: 1927-07-31  Today's Date: 07/08/2021 OT Individual Time: 2876-8115 OT Individual Time Calculation (min): 70 min    Short Term Goals: Week 1:  OT Short Term Goal 1 (Week 1): Pt will don shirt with mod A. OT Short Term Goal 2 (Week 1): Pt will complete toilet transfer with mod A. OT Short Term Goal 3 (Week 1): Pt will complete grooming routine at sink with min A. OT Short Term Goal 4 (Week 1): Pt will don pants with max A.  Skilled Therapeutic Interventions/Progress Updates:    Pt with request to go to the bathroom with therapist came in as she was pointing to the bathroom with nursing present initially.  She was able to transition to sitting with min guard assist and then donn her slip on shoes.  She ambulated with min assist hand held to the toilet with min assist as well for clothing management.  After successful toileting event, she was able to perform toilet hygiene and clothing management with min guard sit to stand.  Min guard assist was then needed for ambulation out to the sink with use of the RW for support, in order to wash her hands.  She was able to stand at the sink with min guard assist and complete washing her hands and brushing her teeth with only min instructional cueing for turning off the water.  She returned to the EOB to rest prior to ambulation out of the room.  She then completed functional mobility down to the ortho gym where she worked with the [employer] for cognitive processing and RUE functional use.  She was able to complete 2 intervals of Visual Scanning for 1 minute each.  She was then able to demonstrate reaction time of 2.2 seconds with an average of 87%.  Transitioned to number sequencing in visual scanning, where she exhibited greater difficulty.  She needed an average of 10 seconds to find each number in a sequence of 1-10, with mod demonstrational cueing to start at  number 1 on the first set.  She then needed an average of 17 seconds on the second interval to locate the numbers in sequence.  Next, had her transfer over to the therapy mat with min assist for sit to stand and then min guard for mobility to the therapy mat with the RW.  In sitting had her work on trying to correctly identify colors of bean bags that therapist would ask her to hand him.  She was correct on 1/5 when asked.  This was repeated using smaller wooden shapes with therapist still only asking for the correct color piece stated.  Again she was only 1/6.  Finished session with ambulation back to the room with min guard assist using the RW for support.  She transferred back to the bed with min guard to complete session.  Call button and phone in reach with safety alarm in place.    Therapy Documentation Precautions:  Precautions Precautions: Fall Precaution Comments: globally aphasic Restrictions Weight Bearing Restrictions: No  Pain: Pain Assessment Pain Scale: Faces Pain Score: 0-No pain ADL: See Care Tool Section for some details of mobility and selfcare  Therapy/Group: Individual Therapy  Chelle Cayton OTR/L 07/08/2021, 3:59 PM

## 2021-07-08 NOTE — Plan of Care (Signed)
°  Problem: RH SAFETY Goal: RH STG ADHERE TO SAFETY PRECAUTIONS W/ASSISTANCE/DEVICE Description: STG Adhere to Safety Precautions With cues Assistance/Device. Outcome: Not Progressing  Patient is still very impulsive and ignores safety measures.

## 2021-07-08 NOTE — Progress Notes (Signed)
Physical Therapy Session Note  Patient Details  Name: Shawna Hopkins MRN: [id number] Date of Birth: [birth year]  Today's Date: 07/08/2021 PT Individual Time: 0800-0910 PT Individual Time Calculation (min): 70 min   Short Term Goals: Week 1:  PT Short Term Goal 1 (Week 1): Pt will ambulate with RW >159ft with min assist PT Short Term Goal 2 (Week 1): Pt will transfer to and from [facility] with CGA and LRAD PT Short Term Goal 3 (Week 1): Pt will ascend 4 steps with BUE support and min assist  Skilled Therapeutic Interventions/Progress Updates:    pt received in bed and agreeable to therapy. No complaint of pain. Pt noted to be aphasic, communicating largely with yes/no questions, simple instructions, and visual demonstration.  Bed mobility with supervision with HOB elevated. Pt indicated need to urinate, ambulatory transfer to toilet with CGA. Supervision for 3/3 toileting tasks. Pt then ambulated to recliner and retrieved clothing. Pt dressed with supervision assist, including donning socks and shoes. Pt ambulated to ortho gym with CGA and RW. Session focused on NMR for improved standing balance and functional mobility. Pt initially retrieved horseshoes from lower rail of RW and handed to therapist at ~chest height. Then directed in retrieving horseshoes from around room for visual scanning and functional mobilities. Attempted standing balance task on wedge, pt unable to maintain balance. Transitioned to ambulating on ramp for improved balance strategies during functional mobility. At this time, pt became drowsy and had difficulty following directions and began to need cues to watch where she was going and not run into obstacles. Pt returned to room and to bed in the same manner as above, and remained in bed, pt was left with all needs in reach and alarm active.   Therapy Documentation Precautions:  Precautions Precautions: Fall Precaution Comments: globally aphasic Restrictions Weight Bearing  Restrictions: No     Therapy/Group: Individual Therapy  Juluis Rainier 07/08/2021, 12:32 PM

## 2021-07-09 NOTE — Progress Notes (Signed)
Physical Therapy Session Note  Patient Details  Name: SHYIA CORSER MRN: [id number] Date of Birth: 29-Sep-1927  Today's Date: 07/09/2021 PT Individual Time: 1345-1405 PT Individual Time Calculation (min): 20 min  and Today's Date: 07/09/2021 PT Missed Time: 25 Minutes Missed Time Reason: Patient unwilling to participate;Patient fatigue  Short Term Goals: Week 1:  PT Short Term Goal 1 (Week 1): Pt will ambulate with RW >129ft with min assist PT Short Term Goal 2 (Week 1): Pt will transfer to and from [facility] with CGA and LRAD PT Short Term Goal 3 (Week 1): Pt will ascend 4 steps with BUE support and min assist  Skilled Therapeutic Interventions/Progress Updates:    Patient received sitting up in bed, appearing tired. She was agreeable to PT assist to bathroom. Patient ambulating without AD and CGA to bathroom. Continent void, set up + supervision for clothing management and perihygiene in standing. Patient ambulating to sink for hand hygiene with CGA. Once standing at the sink, patient appearing even more tired, and gestured as though she was nauseous- patient nodding her head "yes" when PT asked clarifying question. Patient also indicated that she had pain, but could not specify where. PT offering to assist patient back to bed. Bed alarm on, call light within reach, RN aware of patients reports.   Therapy Documentation Precautions:  Precautions Precautions: Fall Precaution Comments: globally aphasic Restrictions Weight Bearing Restrictions: No   Therapy/Group: Individual Therapy  Karoline Caldwell, PT, DPT, CBIS  07/09/2021, 12:40 PM

## 2021-07-09 NOTE — Progress Notes (Signed)
Patient ID: Shawna Hopkins, female   DOB: [birth year], 88 y.o.   MRN: [id number]  Rolling Walker, BSC and TTB ordered through [employer].   [place], [place] [phone]

## 2021-07-09 NOTE — Progress Notes (Signed)
Patient ID: Shawna Hopkins, female   DOB: 1927/11/22, 88 y.o.   MRN: [id number]  Family education scheduled on Friday 1/6 9-12

## 2021-07-09 NOTE — Progress Notes (Signed)
Physical Therapy Session Note  Patient Details  Name: Shawna Hopkins MRN: [id number] Date of Birth: 13-Dec-1927  Today's Date: 07/09/2021 PT Individual Time: 1015-1046 PT Individual Time Calculation (min): 31 min   Short Term Goals: Week 1:  PT Short Term Goal 1 (Week 1): Pt will ambulate with RW >128ft with min assist PT Short Term Goal 2 (Week 1): Pt will transfer to and from [facility] with CGA and LRAD PT Short Term Goal 3 (Week 1): Pt will ascend 4 steps with BUE support and min assist  Skilled Therapeutic Interventions/Progress Updates:    Pt in bed on arrival, appearing mildly distressed. Pt expressed urge to toilet. Supine<>sit with HOB elevated with supervision. Pt stood and ambulated to toilet with HHA for time. Supervision for balance for 3/3 toileting tasks. Pt then ambulated to sink and performed hand hygiene in standing with min cueing. Pt sat in w/c and continued to express distress, but unable to articulate. Pt expressed desire to return to bed and did so in the same manner as above. Pt initially denied pain but later indicated abdominal pain. Pt indicated she feels the need to have a BM but could not do so. Pt noted to be TTP over BIL lower quadrants of abdomen. Pt became emotional d/t aphasia and being unable to express needs. Therapist provided emotional support and encouragement. Pt directed in diaphragmatic breathing for increased parasympathetic response and to lower levels of distress. Pt noted to appear calmer after intervention. Pt remained in bed after session, denying further intervention. Pt was left with all needs in reach and alarm active. Consulted with pt's nurse who took over care at this time. Pt missed 30 minutes of scheduled PT d/t pain. Will attempt to make up as able.  Therapy Documentation Precautions:  Precautions Precautions: Fall Precaution Comments: globally aphasic Restrictions Weight Bearing Restrictions: No General: PT Amount of Missed Time (min): 29  Minutes PT Missed Treatment Reason: Pain     Therapy/Group: Individual Therapy  Mickel Fuchs 07/09/2021, 10:51 AM

## 2021-07-09 NOTE — Progress Notes (Signed)
PROGRESS NOTE   Subjective/Complaints:  Remains aphasic  ROS: cannot obtain due to aphasia   Objective:   No results found. No results for input(s): WBC, HGB, HCT, PLT in the last 72 hours.  No results for input(s): NA, K, CL, CO2, GLUCOSE, BUN, CREATININE, CALCIUM in the last 72 hours.   Intake/Output Summary (Last 24 hours) at 07/09/2021 0913 Last data filed at 07/08/2021 1300 Gross per 24 hour  Intake 60 ml  Output --  Net 60 ml         Physical Exam: Vital Signs Blood pressure (!) 183/97, pulse 91, temperature (!) 97.5 F (36.4 C), temperature source Oral, resp. rate 18, height 5' 5"  (1.651 m), weight 62.6 kg, SpO2 98 %.    General: No acute distress Mood and affect are appropriate Heart: Regular rate and rhythm no rubs murmurs or extra sounds Lungs: Clear to auscultation, breathing unlabored, no rales or wheezes Abdomen: Positive bowel sounds, soft nontender to palpation, nondistended Extremities: No clubbing, cyanosis, or edema Skin: No evidence of breakdown, no evidence of rash    Musculoskeletal:     Cervical back: Normal range of motion. No rigidity.     Comments: 5/5 in LUE/LLE Considering difficulty testing RUE/RLE- at least 4/5- but couldn't comply with entire exam  Skin:    General: Skin is warm and dry.  Neurological:     Mental Status: She is alert.     Comments: Patient is alert.  No acute distress.  She is aphasic.  She would not follow commands but mimic some actions.    Psychiatric:     Comments: Sleepy esp after exam   Assessment/Plan: 1. Functional deficits which require 3+ hours per day of interdisciplinary therapy in a comprehensive inpatient rehab setting. Physiatrist is providing close team supervision and 24 hour management of active medical problems listed below. Physiatrist and rehab team continue to assess barriers to discharge/monitor patient progress toward functional and  medical goals  Care Tool:  Bathing    Body parts bathed by patient: Right arm, Left arm, Chest, Abdomen, Right upper leg, Left upper leg, Face   Body parts bathed by helper: Face, Right arm, Left arm, Chest, Abdomen, Front perineal area, Buttocks, Right upper leg, Left upper leg, Right lower leg, Left lower leg     Bathing assist Assist Level: Contact Guard/Touching assist     Upper Body Dressing/Undressing Upper body dressing   What is the patient wearing?: Pull over shirt    Upper body assist Assist Level: Supervision/Verbal cueing    Lower Body Dressing/Undressing Lower body dressing      What is the patient wearing?: Pants     Lower body assist Assist for lower body dressing: Minimal Assistance - Patient > 75%     Toileting Toileting    Toileting assist Assist for toileting: Supervision/Verbal cueing     Transfers Chair/bed transfer  Transfers assist  Chair/bed transfer activity did not occur: Safety/medical concerns  Chair/bed transfer assist level: Minimal Assistance - Patient > 75%     Locomotion Ambulation   Ambulation assist      Assist level: Minimal Assistance - Patient > 75% Assistive device: Walker-rolling Max distance:  20   Walk 10 feet activity   Assist     Assist level: Minimal Assistance - Patient > 75%     Walk 50 feet activity   Assist Walk 50 feet with 2 turns activity did not occur: Refused         Walk 150 feet activity   Assist Walk 150 feet activity did not occur: Refused         Walk 10 feet on uneven surface  activity   Assist Walk 10 feet on uneven surfaces activity did not occur: Refused         Wheelchair     Assist Is the patient using a wheelchair?: Yes Type of Wheelchair: Manual Wheelchair activity did not occur: Refused         Wheelchair 50 feet with 2 turns activity    Assist    Wheelchair 50 feet with 2 turns activity did not occur: Refused       Wheelchair 150  feet activity     Assist  Wheelchair 150 feet activity did not occur: Refused       Blood pressure (!) 183/97, pulse 91, temperature (!) 97.5 F (36.4 C), temperature source Oral, resp. rate 18, height 5' 5"  (1.651 m), weight 62.6 kg, SpO2 98 %.    Medical Problem List and Plan: 1. Functional deficits left-sided weakness and aphasia secondary to left MCA infarct with hemorrhagic conversion, embolic pattern likely due to new diagnosis of a flutter             -patient may  shower             -ELOS/Goals: 16-18 days- min A PT/OT and mod A SLP  Continue CIR, Team conference today please see physician documentation under team conference tab, met with team  to discuss problems,progress, and goals. Formulized individual treatment plan based on medical history, underlying problem and comorbidities.  2.  Impaired mobility: continue Lovenox             -antiplatelet therapy: Aspirin 81 mg daily.  Considering DOAC once hemorrhage resolved. 3. Pain Management: Tylenol as needed 4. Mood: Provide emotional support             -antipsychotic agents: N/A 5. Neuropsych: This patient is not capable of making decisions on her own behalf. 6. Skin/Wound Care: Routine skin checks 7. Fluids/Electrolytes/Nutrition: Routine in and outs with follow-up chemistries 8.  HTN:  continue lisinopril 2.24m and titrate up as needed this week. Vitals:   07/08/21 2008 07/09/21 0520  BP: (!) 155/74 (!) 183/97  Pulse: 78 91  Resp:  18  Temp: 98.4 F (36.9 C) (!) 97.5 F (36.4 C)  SpO2: 100% 98%  HR improving , increase lisinopril to 519m  9.  New diagnosis a flutter.  Rate controlled.  Continue aspirin. 10.  Incidental finding cerebral aneurysm.  Follow-up outpatient interventional radiology 11.  Borderline enlarged right tracheal bronchial mediastinal lymph node.  Consider CT chest as outpatient. 12. Lethargy: likely secondary to Xanax: d/ced Xanax. Resolved. Start B/C vitamin complex to promote stroke  recovery 13 Prediabetic- is new dx- A1c 6.0- due to age, will see if team thinks should be aggressive on this. Place nursing order to provide only water- no drinks with added sugars 14. UTI: continue keflex 7 days Start probiotic. UC with >100,000 Ecoli pansensitive 15. Sundowning at night: persists despite tx of UTI, did better with seroquel will resume qhs- no am somnolence      LOS:  8 days A FACE TO FACE EVALUATION WAS PERFORMED  Charlett Blake 07/09/2021, 9:13 AM

## 2021-07-09 NOTE — Progress Notes (Signed)
Patient ID: Shawna Hopkins, female   DOB: Apr 22, 1928, 88 y.o.   MRN: [id number]  Team Conference Report to Patient/Family  Team Conference discussion was reviewed with the patient and caregiver, including goals, any changes in plan of care and target discharge date.  Patient and caregiver express understanding and are in agreement.  The patient has a target discharge date of 07/12/21.   SW spoke with patient grandson ([place]). Provided conference updates and recommendations.[place] reports he will need to follow up with his family on the 24/7 recommendation that was agreed upon at admission to ensure they can handle it. [place] also reports that he would call back today with a confirmation of family education. SW will follow up with team.  Andria Rhein 07/09/2021, 11:51 AM

## 2021-07-09 NOTE — Patient Care Conference (Signed)
Inpatient RehabilitationTeam Conference and Plan of Care Update Date: 07/09/2021   Time: 10:01 AM    Patient Name: Shawna Hopkins      Medical Record Number: [id number]  Date of Birth: Jun 06, 1928 Sex: Female         Room/Bed: 4M04C/4M04C-01 Payor Info: Payor: MEDICARE / Plan: MEDICARE PART A AND B / Product Type: *No Product type* /    Admit Date/Time:  07/01/2021  2:40 PM  Primary Diagnosis:  Left middle cerebral artery stroke [facility])  Hospital Problems: Principal Problem:   Left middle cerebral artery stroke [facility])    Expected Discharge Date: Expected Discharge Date: 07/12/21  Team Members Present: Physician leading conference: Dr. Alysia Penna Social Worker Present: Erlene Quan, BSW Nurse Present: Dorien Chihuahua, RN PT Present: Page Spiro, PT OT Present: Providence Lanius, OT SLP Present: Sherren Kerns, SLP PPS Coordinator present : Gunnar Fusi, SLP     Current Status/Progress Goal Weekly Team Focus  Bowel/Bladder   continent of bowel and bladder, LBM 07/08/21  maintain continence  toileting PRN   Swallow/Nutrition/ Hydration   dys 2, thin liquids - min A  sup A  tolerance of dys 2 diet/thin liquids. Dys 3 trials when appropriate   ADL's   min A for UB dressing/ LB dressing / LB bathing at sink, S for UB bathing, grooming, toileting, CGA for ambulatory toilet transfers with RW  S ADL, bathroom transfers, 24/7 S  self-care/balanace/transfer retraining, functional cognition, pt/family education, DME/AE education   Mobility   Bed mobility = supervision; Transfers = supervision to minA; Gait = CGA/ MinA; pt's largest barrier is global aphasia and inability to understand aspects of safety awareness.  Bed mobility at IND, Transfers at supervision, Gait at supervision level with LRAD, Stairs with CGA (will need to ascend/ descend two steps w/o rail available)  R hemibody NMR, strengthening and activity tolerance, continued repetition for safety awareness, continued  attempt for improved speech within session, standing balance, improved LOA with transfers, stairs, gait, initiate family education   Communication   mod A receptive, max A expressive  min A  multimodal communication, verbal repetition, automatic speech tasks, following commands, yes/no responses, object naming   Safety/Cognition/ Behavioral Observations            Pain   no complaints of pain  remain pain free  assess qshift   Skin   skin is intact  maintain skin integrity  assess Qshift     Discharge Planning:  Discharging home with son, daughter, grandson to rotate.   Team Discussion: Patient with global aphasia and sundowning ost Left MCA CVA. Also note poor safety awareness and limitations with comprehension and spontaneous language. Has trouble with structured tasks and communication.  Patient on target to meet rehab goals: Currently needs min assist for lower body care and toileting, and supervision for upper body care; bathes at the sink. Requires CGA for transfers but can be min assist due to poor safety awareness. Requires max - total assist for communication. Goals for discharge set for supervision overall  *See Care Plan and progress notes for long and short-term goals.   Revisions to Treatment Plan:  Downgraded SLP goals to min assist  Teaching Needs: Safety, medication management, secondary risk management, transfers and toileting, etc.   Current Barriers to Discharge: Decreased caregiver support  Possible Resolutions to Barriers: Family education with son     Medical Summary Current Status: sundowning improved with seroquel, BP uncontrolled , UTI being treated  Barriers to Discharge:  Medical stability   Possible Resolutions to [employer] Focus: BP med adjustment , monitor respiratory status as we advance diet   Continued Need for Acute Rehabilitation Level of Care: The patient requires daily medical management by a physician with specialized training in  physical medicine and rehabilitation for the following reasons: Direction of a multidisciplinary physical rehabilitation program to maximize functional independence : Yes Medical management of patient stability for increased activity during participation in an intensive rehabilitation regime.: Yes Analysis of laboratory values and/or radiology reports with any subsequent need for medication adjustment and/or medical intervention. : Yes   I attest that I was present, lead the team conference, and concur with the assessment and plan of the team.   Dorien Chihuahua B 07/09/2021, 12:18 PM

## 2021-07-09 NOTE — Progress Notes (Signed)
Patient ID: Shawna Hopkins, female   DOB: 02-08-1928, 88 y.o.   MRN: [id number]  Sw made attempt to reach patient grandson ([place]). Unable to leave VM due to mailbox being full. SW will continue to attempt.  [place], [place] [phone]

## 2021-07-09 NOTE — Progress Notes (Signed)
Occupational Therapy Weekly Progress Note  Patient Details  Name: Shawna Hopkins MRN: [id number] Date of Birth: [birth year]  Beginning of progress report period: July 02, 2021 End of progress report period: July 09, 2021  Today's Date: 07/09/2021 OT Individual Time: 2751-7001 OT Individual Time Calculation (min): 55 min    Patient has met 4 of 4 short term goals.  Pt has made good progress this week towards LTG. Pt has demonstrated improved alertness, gross/FMC, dynamic standing balance and postural control, awareness to presently complete UB dressing at S to min A level, seated grooming with S, LB bathing and dressing at CGA to min A level, and ambulatory toilet transfers and toileting tasks at CGA level with the RW. Pt cont to be primarily limited by global aphasia impairing safety and IADL performance. Anticipate 25/7 S and no physical assist required upon DC.   Patient continues to demonstrate the following deficits: muscle weakness, decreased cardiorespiratoy endurance, impaired timing and sequencing, unbalanced muscle activation, decreased coordination, and decreased motor planning, decreased attention to right, decreased initiation, decreased attention, decreased awareness, decreased problem solving, decreased safety awareness, decreased memory, and delayed processing, and decreased standing balance, decreased postural control, and decreased balance strategies and therefore will continue to benefit from skilled OT intervention to enhance overall performance with BADL, iADL, and Reduce care partner burden.  Patient progressing toward long term goals..  Continue plan of care.  OT Short Term Goals Week 1:  OT Short Term Goal 1 (Week 1): Pt will don shirt with mod A. OT Short Term Goal 1 - Progress (Week 1): Met OT Short Term Goal 2 (Week 1): Pt will complete toilet transfer with mod A. OT Short Term Goal 2 - Progress (Week 1): Met OT Short Term Goal 3 (Week 1): Pt will complete  grooming routine at sink with min A. OT Short Term Goal 3 - Progress (Week 1): Met OT Short Term Goal 4 (Week 1): Pt will don pants with max A. OT Short Term Goal 4 - Progress (Week 1): Met Week 2:  OT Short Term Goal 1 (Week 2): STG = LTG 2/2 ELOS  Skilled Therapeutic Interventions/Progress Updates:    Pt received semi-reclined in bed finishing breakfast, no s/sx pain throughout session and appears agreeable to therapy. Session focus on self-care retraining, activity tolerance, transfer retraining, functional cognition in prep for improved ADL/IADL/func mobility performance + decreased caregiver burden. Pt able to self-fed breakfast with only set-up A. Came to sitting EOB with close S and completed ambulatory toilet transfer with RW and CGA for intermittent RW management. Completed toileting tasks post continent void of bladder with close S and use of 3in1/grab bars.   Stand at sink completed hand hygiene with S and assist to turn on water/locate soap/towels. Completed UB bathing and dressing seated with S, required min A to thread RLE when donning pants. Required max A to utilize curling iron due to difficulty manipulating clip, but otherwise demonstrated fair safety awareness around hot iron.  Ambulated to and from gym with close S and RW. Required total A to copy egg color design puzzle, but able to point to 3/3 colors accurately when instructed.   Pt left semi-reclined in bed with bed alarm engaged, call bell in reach, and all immediate needs met.    Therapy Documentation Precautions:  Precautions Precautions: Fall Precaution Comments: globally aphasic Restrictions Weight Bearing Restrictions: No  Pain: no s/sx throughout   ADL: See Care Tool for more details.   Therapy/Group: Individual Therapy  Volanda Napoleon MS, OTR/L  07/09/2021, 6:50 AM

## 2021-07-09 NOTE — Plan of Care (Signed)
°  Problem: RH Dressing Goal: LTG Patient will perform lower body dressing w/assist (OT) Description: LTG: Patient will perform lower body dressing with assist, with/without cues in positioning using equipment (OT) Flowsheets (Taken 07/09/2021 1213) LTG: Pt will perform lower body dressing with assistance level of: (downgraded due to slower than anticipated progress) Minimal Assistance - Patient > 75% Note: downgraded due to slower than anticipated progress

## 2021-07-09 NOTE — Plan of Care (Signed)
°  Problem: RH Comprehension Communication Goal: LTG Patient will comprehend basic/complex auditory (SLP) Description: LTG: Patient will comprehend basic/complex auditory information with cues (SLP). Flowsheets (Taken 07/09/2021 1047) LTG: Patient will comprehend auditory information with cueing (SLP): Moderate Assistance - Patient 50 - 74% Note: Goal downgraded due to slow progress   Problem: RH Expression Communication Goal: LTG Patient will express needs/wants via multi-modal(SLP) Description: LTG:  Patient will express needs/wants via multi-modal communication (gestures/written, etc) with cues (SLP) Flowsheets (Taken 07/09/2021 1047) LTG: Patient will express needs/wants via multimodal communication (gestures/written, etc) with cueing (SLP):  Moderate Assistance - Patient 50 - 74%  Maximal Assistance - Patient 25 - 49% Note: Goal downgraded due to slow progress

## 2021-07-09 NOTE — Progress Notes (Addendum)
Speech Language Pathology Weekly Progress and Session Note  Patient Details  Name: Shawna Hopkins MRN: [id number] Date of Birth: [birth year]  Beginning of progress report period: July 02, 2021 End of progress report period: July 09, 2021  Today's Date: 07/09/2021 SLP Individual Time: 0737-1062 SLP Individual Time Calculation (min): 15 min and Today's Date: 07/09/2021 SLP Missed Time: 30 Minutes Missed Time Reason: Patient fatigue  Short Term Goals: Week 1: SLP Short Term Goal 1 (Week 1): Patient will consume current diet without overt s/sx of aspiration and mod A cues for use of swallowing compensatory strategies SLP Short Term Goal 1 - Progress (Week 1): Met SLP Short Term Goal 2 (Week 1): Patient will attend to basic, famliar tasks for 3 minute intervals with max A verbal cues for redirection SLP Short Term Goal 2 - Progress (Week 1): Met SLP Short Term Goal 3 (Week 1): Patient will follow basic one-step commands in 50% of opportunities with max A multimodal cues SLP Short Term Goal 3 - Progress (Week 1): Met SLP Short Term Goal 4 (Week 1): Patient will respond to yes/no questions regarding basic wants/needs during 50% of occasions with max A multimodal cues SLP Short Term Goal 4 - Progress (Week 1): Met SLP Short Term Goal 5 (Week 1): Patient will identify objects in field of 2 with max A verbal/visual cues SLP Short Term Goal 5 - Progress (Week 1): Not met SLP Short Term Goal 6 (Week 1): Patient will make vocalizations during language tasks (automatic speech) with max A multimodal cues SLP Short Term Goal 6 - Progress (Week 1): Met  New Short Term Goals: Week 2: SLP Short Term Goal 1 (Week 2): STG=LTG due to ELOS  Weekly Progress Updates: Patient has made slow gains and has met 5 of 6 STGs this reporting period. Currently, patient demonstrates improved communication via multimodal means, basic comprehension, and spontaneous speech requiring overall mod-to-max A for  comprehension, and max A for expression. Patient primarily communicates through non-verbal means at this time and most consistent with nodding her head to basic yes/no questions to identify needs. Patient is currently consuming dysphagia 2 textures, thin liquids. Diet progression has been slow due to patient fatigue. Patient and family education is ongoing. Long term goals were downgraded to mod-to-max A for communication due to slow progress. Patient would benefit from continued skilled SLP intervention to maximize communication and swallow functioning and overall functional independence prior to discharge.   Intensity: Minumum of 1-2 x/day, 30 to 90 minutes Frequency: 3 to 5 out of 7 days Duration/Length of Stay: 1/7 pending family education Treatment/Interventions: Internal/external aids;Multimodal communication approach;Speech/Language facilitation;Cueing hierarchy;Functional tasks;Patient/family education;Therapeutic Activities  Daily Session Skilled Therapeutic Interventions: Skilled ST treatment focused on language goals. Patient was sleeping on arrival and roused to mild verbal stimuli. After acknowledging SLP patient repetitively shook her head "no" and displayed a frowning face/eyebrows and kept her eyes closed. Through answering yes/no questions via head nods and mod A verbal repetition, SLP gathered that patient was not feeling well. She denied pain and was unable to point to part of her body that felt uncomfortable; denied headache or stomach ache. Pt unable to successfully utilize picture based communication during this encounter. Patient appeared resistant toward SLP and when asked "are you able to participate in therapy right now?" Patient nodded her head "no". SLP followed up and asked if patient wanted to rest to which she shook her head "yes". Patient missed 30 minutes of skilled SLP intervention. Notified nurse of  patient not feeling well. Patient was left in bed with alarm activated and  immediate needs within reach at end of session. Continue per current plan of care.     General    Pain Pain Assessment Pain Scale: Faces Faces Pain Scale: Hurts a little bit PAINAD (Pain Assessment in Advanced Dementia) Breathing: normal Negative Vocalization: occasional moan/groan, low speech, negative/disapproving quality Facial Expression: sad, frightened, frown Body Language: relaxed Consolability: no need to console PAINAD Score: 2 Critical Care Pain Observation Tool (CPOT) Facial Expression: Relaxed, neutral Body Movements: Absence of movements Muscle Tension: Relaxed Compliance with ventilator (intubated pts.): N/A Vocalization (extubated pts.): N/A CPOT Total: 0  Therapy/Group: Individual Therapy  Patty Sermons 07/09/2021, 12:22 PM

## 2021-07-10 MED ORDER — LISINOPRIL 5 MG PO TABS
5.0000 mg | ORAL_TABLET | Freq: Every day | ORAL | Status: DC
  Administered 2021-07-10: 5 mg via ORAL
  Filled 2021-07-10 (×2): qty 1

## 2021-07-10 NOTE — Progress Notes (Addendum)
Speech Language Pathology Discharge Summary  Patient Details  Name: Shawna Hopkins MRN: [id number] Date of Birth: Nov 19, 1927  Today's Date: 07/11/2021 SLP Individual Time: 0905-1000 SLP Individual Time Calculation (min): 55 min  Skilled Therapeutic Interventions: Skilled ST treatment focused on communication, swallowing goals, and family education. Patient was accompanied by her son, daughter, and grandson. SLP provided extensive education on expressive/receptive language deficits, communication strategies, swallowing, diet recommendations, and recommendations for follow up speech therapy in home health setting, as well as 24 hour supervision. SLP provided handouts to reinforce education. Family expressed they were in the process of figuring out 24 hour supervision on a consistent basis and are prepared to take patient home tomorrow. All questions were addressed. ST to sign off in anticipation of d/c tomorrow. Patient was left in bed with alarm activated and immediate needs within reach at end of session.   Patient has met 5 of 5 long term goals.  Patient to discharge at overall Mod;Max level.   Reasons goals not met: None - all goals met   Clinical Impression/Discharge Summary: Patient has made slow but functional gains and has met 5 of 5 long-term goals this admission due to improved communication through multimodal means, receptive language skills, and oral swallow function. Patient primarily communicates non-verbally at this time with head nods, gestures, and facial expressions. Patient has made improvement with singing, automatic speech, verbal approximations, and verbally repeating at word level with max A. Patient is able to follow concrete single commands in natural contexts and during ADLs, as well as respond to basic yes/no questions with head nods with ~75% accuracy. Patient is able to point to field of 2 written words and object identification with >29% accuracy. Patient is still unable  to initiate written responses, but demonstrating improvement with copy and re-writing words. Patient is currently consuming a dysphagia 3 diet and thin liquids with sup A cues for implementing safe swallowing compensatory strategies. Patient and family education is complete and patient to discharge at overall mod-to-max A level. Patient's care partner is independent to provide the necessary physical and cognitive assistance at discharge. 24 hour supervision is recommended due to language/cognitive deficits for optimal safety. Patient would benefit from continued SLP services in home health setting to maximize communication and swallow function and decrease caregiver burden.  Care Partner:  Caregiver Able to Provide Assistance: Yes  Type of Caregiver Assistance: Cognitive;Physical  Recommendation:  24 hour supervision/assistance;Home Health SLP  Rationale for SLP Follow Up: Maximize functional communication;Maximize swallowing safety;Reduce caregiver burden;Maximize cognitive function and independence   Equipment: None   Reasons for discharge: Discharged from hospital;Treatment goals met   Patient/Family Agrees with Progress Made and Goals Achieved: Yes    Patty Sermons 07/11/2021, 12:39 PM

## 2021-07-10 NOTE — Progress Notes (Signed)
Physical Therapy Weekly Progress Note  Patient Details  Name: Shawna Hopkins MRN: [id number] Date of Birth: [birth year]  Beginning of progress report period: July 02, 2021 End of progress report period: July 10, 2021  Today's Date: 07/10/2021 PT Individual Time: 1100-1155 PT Individual Time Calculation (min): 55 min   Patient has met 3 of 3 short term goals. Pt making appropriate progress towards goals. Overall, she requires supervision for bed mobility, CGA for transfers with RW, and CGA for ambulating 166f with RW. She also can navigate up/down x4 steps with CGA while using 2 hand rails. She is primarily limited by decreased activity tolerance, posterior bias in standing with decreased dynamic standing balance, and cognitive/communication deficits limiting her ability to express wants/needs. Anticipate 24/7 S/A will be necessary at DC due to these impairments. Family education is scheduled prior to DC to ensure safe transition home.  Patient continues to demonstrate the following deficits muscle weakness, decreased cardiorespiratoy endurance, decreased motor planning, decreased motor planning, decreased problem solving and decreased safety awareness, and decreased standing balance, hemiplegia, and decreased balance strategies and therefore will continue to benefit from skilled PT intervention to increase functional independence with mobility.  Patient progressing toward long term goals..  Continue plan of care.  PT Short Term Goals Week 1:  PT Short Term Goal 1 (Week 1): Pt will ambulate with RW >1067fwith min assist PT Short Term Goal 1 - Progress (Week 1): Met PT Short Term Goal 2 (Week 1): [facility] Short Term Goal 2 - Progress (Week 1): Met PT Short Term Goal 3 (Week 1): Pt will ascend 4 steps with BUE support and min assist PT Short Term Goal 3 - Progress (Week 1): Met Week 2:  PT Short Term Goal 1 (Week 2): STG = LTG due to  ELOS  Skilled Therapeutic Interventions/Progress Updates:     Pt presents sleeping in bed, awakens to voice and is agreeable to PT tx. Expressive > receptive aphasia impacting language and communication during session. Pt completed supine<>sit with supervision with HOB slightly elevated. Sit<>stand to RW with CGA and ambulating to ortho rehab gym, ~10070fwith CGA and RW. In rehab gym, worked on sit<>stand transfers, static standing balance, and dynamic standing balance. 1x5 sit<>stands with CGA and no AD with moderate posterior bias noted. Completed standing ball toss with SPT with PT guarding with minA - VC for correcting posterior lean but limited correction. Provided declined wedge to promote forward weight shift, completed static standing with BUE support and minA while incorporating eyes closed, head turns, and shoulder turns. Pt requiring frequent, brief rest breaks b/w tasks due to fatigue. Ambulated to main rehab gym with CGA and RW with noted fatigue due to decreased gait speed. Seated rest break prior to engaging in stair training. She navigated up/down 4 + 4 steps with CGA and 2 hand rails with self selected reciprocal stepping. Pt returned to her room in w/c at end of session and assisted to bed with HHA and minA transfer. Completed bed mobility with supervision and pt concluded session supine in bed with all needs in reach and bed alarm on. Attempted various methods of communicating with pt (yes/no, pointing, field of 2) but unable to consistently respond appropriately.   Therapy Documentation Precautions:  Precautions Precautions: Fall Precaution Comments: globally aphasic Restrictions Weight Bearing Restrictions: No General:    Therapy/Group: Individual Therapy  ChrAlger Simons5/2023, 7:57 AM

## 2021-07-10 NOTE — Progress Notes (Signed)
Occupational Therapy Discharge Summary  Patient Details  Name: Shawna Hopkins MRN: [id number] Date of Birth: 09/02/1927   Patient has met 57 of 11 long term goals due to improved activity tolerance, improved balance, postural control, ability to compensate for deficits, improved attention, improved awareness, and improved coordination.  Patient to discharge at overall  S level for BADL/bathroom transfers with use of RW .  Patient's care partner  intends to arrange 24/7 assistance  to provide the necessary physical and cognitive assistance at discharge.   Pt to DC at the below ADL/bathroom transfer performance level. Assist levels represent pt's most consistent performance when alert/participatory. Pt continues to be primarily limited by receptive and expressive language deficits impairing safety awareness and increasing potential falls risk. Family education completed on 07/11/21 with caregivers demonstrating good recall of safety precautions in setting of global aphasia and verbalized understanding of need for 24/7 S. Pt and caregivers will benefit from continued [facility] OT to continue to facilitate improved caregiver education, functional mobility, and occupational performance.   Reasons goals not met: NA  Recommendation:  Patient will benefit from ongoing skilled OT services in home health setting to continue to advance functional skills in the area of BADL, iADL, and Reduce care partner burden.  Equipment: 3in1, TTB  Reasons for discharge: treatment goals met and discharge from hospital  Patient/family agrees with progress made and goals achieved: Yes  OT Discharge Precautions/Restrictions  Precautions Precautions: Fall Precaution Comments: globally aphasic Restrictions Weight Bearing Restrictions: No  ADL ADL Eating: Set up Where Assessed-Eating: Wheelchair, Bed level Grooming: Supervision/safety Where Assessed-Grooming: Standing at sink Upper Body Bathing:  Supervision/safety Where Assessed-Upper Body Bathing: Sitting at sink Lower Body Bathing: Supervision/safety Where Assessed-Lower Body Bathing: Sitting at sink, Standing at sink Upper Body Dressing: Supervision/safety Where Assessed-Upper Body Dressing: Sitting at sink Lower Body Dressing: Supervision/safety Where Assessed-Lower Body Dressing: Sitting at sink, Standing at sink Toileting: Supervision/safety Where Assessed-Toileting: Toilet, Recruitment consultant Transfer: Close supervision Toilet Transfer Method: Counselling psychologist: Bedside commode, Grab bars, Raised toilet seat Tub/Shower Transfer: Close supervison Clinical cytogeneticist Method: Magazine features editor: Not assessed Vision Baseline Vision/History: 1 Wears glasses Patient Visual Report: Other (comment) (pt unable to state due to expressive/receptive language deficits) Vision Assessment?: Vision impaired- to be further tested in functional context Additional Comments: At times appeared to be having difficulty seeing items/objects in her R visual field, req min visual cues to locate items, but grossly [facility] for ADL. Perception  Perception: Impaired Inattention/Neglect: Does not attend to right visual field Praxis Praxis: Impaired Praxis Impairment Details: Perseveration Cognition Overall Cognitive Status: Impaired/Different from baseline Arousal/Alertness: Awake/alert Orientation Level: Oriented to person (difficult to assess due to aphasia) Year:  (unable to respond due to global aphasia) Month:  (unable to respond due to global aphasia) Day of Week: Other (Comment) (unable to respond due to global aphasia) Attention: Sustained Sustained Attention: Impaired Memory:  (difficulty assessing due to global aphasia) Immediate Memory Recall:  (unable to respond due to global aphasia) Memory Recall Sock:  (unable to respond due to global aphasia) Memory Recall Blue:  (unable to respond due to  global aphasia) Memory Recall Bed:  (unable to respond due to global aphasia) Awareness: Impaired Awareness Impairment: Intellectual impairment Problem Solving: Impaired Problem Solving Impairment: Verbal basic;Functional basic Safety/Judgment: Impaired Comments: unable to fully assess due to global aphasia/lethargy Sensation Sensation Light Touch: Appears Intact (unable to fully assess due to global aphasia) Hot/Cold: Appears Intact Proprioception: Appears Intact Stereognosis: Appears Intact Coordination  Gross Motor Movements are Fluid and Coordinated: No (limited by generalized deconditioning and reliance on BUE support for dynamic tasks) Fine Motor Movements are Fluid and Coordinated: Yes Motor  Motor Motor: Other (comment) Motor - Discharge Observations: limited by generalized deconditioning and reliance on BUE support for dynamic tasks Mobility  Bed Mobility Bed Mobility: Supine to Sit;Sit to Supine Supine to Sit: Supervision/Verbal cueing Sit to Supine: Supervision/Verbal cueing Transfers Sit to Stand: Supervision/Verbal cueing Stand to Sit: Supervision/Verbal cueing  Trunk/Postural Assessment  Cervical Assessment Cervical Assessment: Exceptions to [facility] (forward head) Thoracic Assessment Thoracic Assessment: Exceptions to [facility] (rounded shoulders) Lumbar Assessment Lumbar Assessment: Exceptions to [facility] (posterior pelvic tilt) Postural Control Postural Control: Deficits on evaluation (heavy reliance on BUE support with dynamic standing tasks)  Balance Balance Balance Assessed: Yes Static Sitting Balance Static Sitting - Balance Support: Feet supported Static Sitting - Level of Assistance: 6: Modified independent (Device/Increase time) Dynamic Sitting Balance Dynamic Sitting - Balance Support: Feet unsupported;No upper extremity supported Dynamic Sitting - Level of Assistance: 5: Stand by assistance Static Standing Balance Static Standing - Balance Support: Bilateral  upper extremity supported Static Standing - Level of Assistance: 5: Stand by assistance Dynamic Standing Balance Dynamic Standing - Balance Support: Bilateral upper extremity supported Dynamic Standing - Level of Assistance: 5: Stand by assistance Extremity/Trunk Assessment RUE Assessment RUE Assessment: Within Functional Limits LUE Assessment LUE Assessment: Within Functional Limits   Volanda Napoleon MS, OTR/L  07/10/2021, 7:18 AM

## 2021-07-10 NOTE — Progress Notes (Signed)
Patient ID: Shawna Hopkins, female   DOB: 11-06-1927, 88 y.o.   MRN: [id number]  Patient [name] referral sent to [facility]

## 2021-07-10 NOTE — Progress Notes (Addendum)
Speech Language Pathology Daily Session Note  Patient Details  Name: Shawna Hopkins MRN: [id number] Date of Birth: 10/26/1927  Today's Date: 07/10/2021 SLP Individual Time: 0900-1000 and 1210-1222 SLP Individual Time Calculation (min): 60 min and 12 minutes  Short Term Goals: Week 2: SLP Short Term Goal 1 (Week 2): STG=LTG due to ELOS  Skilled Therapeutic Interventions:  Session 1: Skilled ST treatment focused on swallowing and communication goals. Patient was sleeping soundly on arrival and required moderate cueing to rouse. When asked, patient indicated that she needed to use the bathroom and ambulated using RW and CGA. Patient was continent of urine. Pt consistently followed 1-step commands with mod A verbal and visual cues (flush toilet, walk to sink to wash hands, brush hair, brush teeth). Pt performed oral care at sink with sup A verbal cues and exhibited difficulty sequencing on occasion. Patient ambulated back to bed using RW for remainder of session. Pt accepted dys 3 PO trials where she exhibited slightly prolonged yet effective mastication and trace oral residue cleared effectively with liquid rinse. Pt known to take very small bites. No overt s/s of aspiration noted. Recommend dys 3 trial tray to determine appropriateness for diet advancement. Pt nodded her head yes in agreement.   Pt continues to communicate primarily through non verbal means consisting mainly of head nods and gestures. Yes/no responses were approximately 75% accurate with basic biographical and environmental questions. Pt approximated a few words with singing (happy birthday). She counted 1-10 with 30% accuracy and max A multimodal cues. She verbally repeated her first and last name during 50% of occasions with max A verbal/visual cues and intoning. She approximated single syllable words with difficulty. Pt pointed to field of 2 items with mod A verbal cues (semantic/function cues) to achieve 80% accuracy. Patient was  left in bed with alarm activated and immediate needs within reach. Pt quick to return back to sleep at end of session. Continue per current plan of care.    Session 2: 1210-1222: Patient received trial tray. Similar to trials earlier today, she consumed dysphagia 3 textures with mildly prolonged yet effective mastication, trace-to-complete  oral clearance, and no overt s/s of aspiration. Patient independently implemented lingual sweep to clear residuals from buccal cavity. Recommend diet advancement to dys 3 textures, thin liquids as tolerated. Continue to crush medications. Continue intermittent supervision during meals.     Pain Pain Assessment Pain Scale: Faces Faces Pain Scale: No hurt PAINAD (Pain Assessment in Advanced Dementia) Breathing: normal Negative Vocalization: none Facial Expression: smiling or inexpressive Body Language: relaxed Consolability: no need to console PAINAD Score: 0  Therapy/Group: Individual Therapy  Tamala Ser 07/10/2021, 9:17 AM

## 2021-07-10 NOTE — Progress Notes (Signed)
Occupational Therapy Session Note  Patient Details  Name: Shawna Hopkins MRN: [id number] Date of Birth: 01/30/1928  Today's Date: 07/10/2021 OT Individual Time: 8032-1224 OT Individual Time Calculation (min): 53 min + 24 min   Short Term Goals: Week 1:  OT Short Term Goal 1 (Week 1): Pt will don shirt with mod A. OT Short Term Goal 1 - Progress (Week 1): Met OT Short Term Goal 2 (Week 1): Pt will complete toilet transfer with mod A. OT Short Term Goal 2 - Progress (Week 1): Met OT Short Term Goal 3 (Week 1): Pt will complete grooming routine at sink with min A. OT Short Term Goal 3 - Progress (Week 1): Met OT Short Term Goal 4 (Week 1): Pt will don pants with max A. OT Short Term Goal 4 - Progress (Week 1): Met Week 2:  OT Short Term Goal 1 (Week 2): STG = LTG 2/2 ELOS  Skilled Therapeutic Interventions/Progress Updates:    Session 1 (8250-0370): Pt received asleep in bed, awakened with max tactile and verbal cuing and then agreeable to therapy. Session focus on self-care retraining, activity tolerance, transfer retraining in prep for improved ADL/IADL/func mobility performance + decreased caregiver burden. Came to sitting EOB with close S and agreeable to get OOB to eat breakfast. Stand-pivot > w/c with min HHA. Set-up A required for breakfast items due to poor initiation, but pt able to self-feed with distant S. Noted to fall asleep occasionally and declining additional food. MD in/out morning assessment.   Donned bra/shirt with S while seated, able to don pants this date with S, as well. Demod good error awareness after donning pants backwards and utilizing pockets as reference for orientation. Completed ambulatory toilet transfer with close S and RW and completed toileting tasks post continent void of bladder with use of grab bars. Standing at sink, able to complete hand hygiene and oral care with S and only min visual cuing to locate all necessary items on sink. Ambulatory transfer back  to bed with CGA due to poor RW use, returned to supine with S and noted to fall asleep.  Pt left with HOB at 30 degrees with bed alarm engaged, call bell in reach, and all immediate needs met.    Session 2 [phone]): Pt received semi-reclined in bed, no s/sx pain, agreeable to therapy. Session focus on self-care retraining, activity tolerance, transfer retraining in prep for improved ADL/IADL/func mobility performance + decreased caregiver burden. Completed ambulatory toilet transfer with close S and RW and completed toileting tasks post continent void of bladder with use of grab bars. Standing at sink, able to complete hand hygiene and hair care with S and only min visual cuing to locate all necessary items on sink. Donned/doffed B shoes with close S. Ambulated throughout session with RW and close S, appropriate RW use throughout. Required visual cuing at each step to pathfind way to ADL apartment and own room. Pt able to complete TTB transfer after visual demonstration with min Vcs and close S for balance. Additionally complete recliner transfer with CGA to power up from low surface.  Pt left with HOB at 30 degrees with bed alarm engaged, call bell in reach, and all immediate needs met.    Therapy Documentation Precautions:  Precautions Precautions: Fall Precaution Comments: globally aphasic Restrictions Weight Bearing Restrictions: No  Pain: see session notes   ADL: See Care Tool for more details.   Therapy/Group: Individual Therapy  Volanda Napoleon MS, OTR/L  07/10/2021, 6:49 AM

## 2021-07-10 NOTE — Progress Notes (Signed)
PROGRESS NOTE   Subjective/Complaints:  Remains aphasic , working with OT, planning family training in am  ROS: cannot obtain due to aphasia   Objective:   No results found. No results for input(s): WBC, HGB, HCT, PLT in the last 72 hours.  No results for input(s): NA, K, CL, CO2, GLUCOSE, BUN, CREATININE, CALCIUM in the last 72 hours.  No intake or output data in the 24 hours ending 07/10/21 0730       Physical Exam: Vital Signs Blood pressure (!) 180/88, pulse 64, temperature (!) 97.5 F (36.4 C), temperature source Oral, resp. rate 16, height 5\' 5"  (1.651 m), weight 62.6 kg, SpO2 97 %.    General: No acute distress Mood and affect are appropriate Heart: Regular rate and rhythm no rubs murmurs or extra sounds Lungs: Clear to auscultation, breathing unlabored, no rales or wheezes Abdomen: Positive bowel sounds, soft nontender to palpation, nondistended Extremities: No clubbing, cyanosis, or edema Skin: No evidence of breakdown, no evidence of rash    Musculoskeletal:     Cervical back: Normal range of motion. No rigidity.     Comments: 5/5 in LUE/LLE Considering difficulty testing RUE/RLE- at least 4/5- but couldn't comply with entire exam  Skin:    General: Skin is warm and dry.  Neurological:     Mental Status: She is alert.     Comments: Patient is alert.  No acute distress.  She is aphasic.  She would not follow commands but mimic some actions.    Psychiatric:     Comments: Sleepy esp after exam   Assessment/Plan: 1. Functional deficits which require 3+ hours per day of interdisciplinary therapy in a comprehensive inpatient rehab setting. Physiatrist is providing close team supervision and 24 hour management of active medical problems listed below. Physiatrist and rehab team continue to assess barriers to discharge/monitor patient progress toward functional and medical goals  Care Tool:  Bathing     Body parts bathed by patient: Right arm, Left arm, Chest, Abdomen, Right upper leg, Left upper leg, Face   Body parts bathed by helper: Face, Right arm, Left arm, Chest, Abdomen, Front perineal area, Buttocks, Right upper leg, Left upper leg, Right lower leg, Left lower leg     Bathing assist Assist Level: Contact Guard/Touching assist     Upper Body Dressing/Undressing Upper body dressing   What is the patient wearing?: Pull over shirt    Upper body assist Assist Level: Supervision/Verbal cueing    Lower Body Dressing/Undressing Lower body dressing      What is the patient wearing?: Pants     Lower body assist Assist for lower body dressing: Minimal Assistance - Patient > 75%     Toileting Toileting    Toileting assist Assist for toileting: Supervision/Verbal cueing     Transfers Chair/bed transfer  Transfers assist  Chair/bed transfer activity did not occur: Safety/medical concerns  Chair/bed transfer assist level: Minimal Assistance - Patient > 75%     Locomotion Ambulation   Ambulation assist      Assist level: Minimal Assistance - Patient > 75% Assistive device: Walker-rolling Max distance: 20   Walk 10 feet activity   Assist  Assist level: Minimal Assistance - Patient > 75%     Walk 50 feet activity   Assist Walk 50 feet with 2 turns activity did not occur: Refused         Walk 150 feet activity   Assist Walk 150 feet activity did not occur: Refused         Walk 10 feet on uneven surface  activity   Assist Walk 10 feet on uneven surfaces activity did not occur: Refused         Wheelchair     Assist Is the patient using a wheelchair?: Yes Type of Wheelchair: Manual Wheelchair activity did not occur: Refused         Wheelchair 50 feet with 2 turns activity    Assist    Wheelchair 50 feet with 2 turns activity did not occur: Refused       Wheelchair 150 feet activity     Assist  Wheelchair  150 feet activity did not occur: Refused       Blood pressure (!) 180/88, pulse 64, temperature (!) 97.5 F (36.4 C), temperature source Oral, resp. rate 16, height 5\' 5"  (1.651 m), weight 62.6 kg, SpO2 97 %.    Medical Problem List and Plan: 1. Functional deficits left-sided weakness and aphasia secondary to left MCA infarct with hemorrhagic conversion, embolic pattern likely due to new diagnosis of a flutter             -patient may  shower             -ELOS/Goals: 16-18 days- min A PT/OT and mod A SLP  Continue CIR,  2.  Impaired mobility: continue Lovenox             -antiplatelet therapy: Aspirin 81 mg daily.  Considering DOAC once hemorrhage resolved. 3. Pain Management: Tylenol as needed 4. Mood: Provide emotional support             -antipsychotic agents: N/A 5. Neuropsych: This patient is not capable of making decisions on her own behalf. 6. Skin/Wound Care: Routine skin checks 7. Fluids/Electrolytes/Nutrition: Routine in and outs with follow-up chemistries 8.  HTN:  continue lisinopril 2.5mg  and titrate up as needed this week. Vitals:   07/09/21 1912 07/10/21 0351  BP: (!) 172/95 (!) 180/88  Pulse: 66 64  Resp: 18 16  Temp: 98 F (36.7 C) (!) 97.5 F (36.4 C)  SpO2: 98% 97%  HR improving , systolic BP elevated increase lisinopril to 5mg  on 1/5  9.  New diagnosis a flutter.  Rate controlled Lopressor 50mg  BID .  Continue aspirin. 10.  Incidental finding cerebral aneurysm.  Follow-up outpatient interventional radiology 11.  Borderline enlarged right tracheal bronchial mediastinal lymph node.  Consider CT chest as outpatient. 12. Lethargy: likely secondary to Xanax: d/ced Xanax. Resolved. Start B/C vitamin complex to promote stroke recovery 13 Prediabetic- is new dx- A1c 6.0- due to age, will see if team thinks should be aggressive on this. Place nursing order to provide only water- no drinks with added sugars 14. UTI: continue keflex 7 days- Completed  Start probiotic.  UC with >100,000 Ecoli pansensitive 15. Sundowning at night: persists despite tx of UTI, did better with seroquel will resume qhs- no am somnolence      LOS: 9 days A FACE TO FACE EVALUATION WAS PERFORMED  Charlett Blake 07/10/2021, 7:30 AM

## 2021-07-11 ENCOUNTER — Inpatient Hospital Stay (HOSPITAL_COMMUNITY): Payer: Medicare Other

## 2021-07-11 ENCOUNTER — Other Ambulatory Visit (HOSPITAL_COMMUNITY): Payer: Self-pay

## 2021-07-11 MED ORDER — LISINOPRIL 10 MG PO TABS
10.0000 mg | ORAL_TABLET | Freq: Every day | ORAL | 0 refills | Status: AC
Start: 2021-07-12 — End: ?
  Filled 2021-07-11: qty 30, 30d supply, fill #0

## 2021-07-11 MED ORDER — SACCHAROMYCES BOULARDII 250 MG PO CAPS
250.0000 mg | ORAL_CAPSULE | Freq: Two times a day (BID) | ORAL | 0 refills | Status: AC
  Filled 2021-07-11: qty 60, 30d supply, fill #0

## 2021-07-11 MED ORDER — QUETIAPINE FUMARATE 25 MG PO TABS
25.0000 mg | ORAL_TABLET | Freq: Every day | ORAL | 0 refills | Status: AC
Start: 2021-07-11 — End: ?
  Filled 2021-07-11: qty 30, 30d supply, fill #0

## 2021-07-11 MED ORDER — ASPIRIN 81 MG PO TBEC: 81.0000 mg | DELAYED_RELEASE_TABLET | Freq: Every day | ORAL | 11 refills | Status: DC

## 2021-07-11 MED ORDER — LISINOPRIL 10 MG PO TABS
10.0000 mg | ORAL_TABLET | Freq: Every day | ORAL | Status: DC
  Administered 2021-07-11 – 2021-07-12 (×2): 10 mg via ORAL
  Filled 2021-07-11: qty 1

## 2021-07-11 MED ORDER — B COMPLEX-C PO TABS
1.0000 | ORAL_TABLET | Freq: Every day | ORAL | 0 refills | Status: AC
  Filled 2021-07-11: qty 30, 30d supply, fill #0

## 2021-07-11 MED ORDER — SENNOSIDES-DOCUSATE SODIUM 8.6-50 MG PO TABS
1.0000 | ORAL_TABLET | Freq: Two times a day (BID) | ORAL | 0 refills | Status: AC
Start: 2021-07-11 — End: ?
  Filled 2021-07-11: qty 60, 30d supply, fill #0

## 2021-07-11 MED ORDER — APIXABAN 2.5 MG PO TABS
2.5000 mg | ORAL_TABLET | Freq: Two times a day (BID) | ORAL | Status: DC
  Administered 2021-07-11 – 2021-07-12 (×2): 2.5 mg via ORAL
  Filled 2021-07-11 (×2): qty 1

## 2021-07-11 MED ORDER — PANTOPRAZOLE SODIUM 40 MG PO TBEC
40.0000 mg | DELAYED_RELEASE_TABLET | Freq: Every day | ORAL | 0 refills | Status: AC
Start: 2021-07-12 — End: ?
  Filled 2021-07-11: qty 30, 30d supply, fill #0

## 2021-07-11 MED ORDER — APIXABAN 2.5 MG PO TABS
2.5000 mg | ORAL_TABLET | Freq: Two times a day (BID) | ORAL | 0 refills | Status: AC
  Filled 2021-07-11: qty 60, 30d supply, fill #0

## 2021-07-11 MED ORDER — METOPROLOL TARTRATE 50 MG PO TABS
50.0000 mg | ORAL_TABLET | Freq: Two times a day (BID) | ORAL | 0 refills | Status: AC
Start: 2021-07-11 — End: ?
  Filled 2021-07-11: qty 60, 30d supply, fill #0

## 2021-07-11 NOTE — Plan of Care (Signed)
Problem: RH Balance Goal: LTG: Patient will maintain dynamic sitting balance (OT) Description: LTG:  Patient will maintain dynamic sitting balance with assistance during activities of daily living (OT) Outcome: Completed/Met Goal: LTG Patient will maintain dynamic standing with ADLs (OT) Description: LTG:  Patient will maintain dynamic standing balance with assist during activities of daily living (OT)  Outcome: Completed/Met   Problem: Sit to Stand Goal: LTG:  Patient will perform sit to stand in prep for activites of daily living with assistance level (OT) Description: LTG:  Patient will perform sit to stand in prep for activites of daily living with assistance level (OT) Outcome: Completed/Met   Problem: RH Eating Goal: LTG Patient will perform eating w/assist, cues/equip (OT) Description: LTG: Patient will perform eating with assist, with/without cues using equipment (OT) Outcome: Completed/Met   Problem: RH Grooming Goal: LTG Patient will perform grooming w/assist,cues/equip (OT) Description: LTG: Patient will perform grooming with assist, with/without cues using equipment (OT) Outcome: Completed/Met   Problem: RH Bathing Goal: LTG Patient will bathe all body parts with assist levels (OT) Description: LTG: Patient will bathe all body parts with assist levels (OT) Outcome: Completed/Met   Problem: RH Dressing Goal: LTG Patient will perform upper body dressing (OT) Description: LTG Patient will perform upper body dressing with assist, with/without cues (OT). Outcome: Completed/Met Goal: LTG Patient will perform lower body dressing w/assist (OT) Description: LTG: Patient will perform lower body dressing with assist, with/without cues in positioning using equipment (OT) Outcome: Completed/Met   Problem: RH Toileting Goal: LTG Patient will perform toileting task (3/3 steps) with assistance level (OT) Description: LTG: Patient will perform toileting task (3/3 steps) with  assistance level (OT)  Outcome: Completed/Met   Problem: RH Functional Use of Upper Extremity Goal: LTG Patient will use RT/LT upper extremity as a (OT) Description: LTG: Patient will use right/left upper extremity as a stabilizer/gross assist/diminished/nondominant/dominant level with assist, with/without cues during functional activity (OT) Outcome: Completed/Met   Problem: RH Awareness Goal: LTG: Patient will demonstrate awareness during functional activites type of (OT) Description: LTG: Patient will demonstrate awareness during functional activites type of (OT) Outcome: Completed/Met   Problem: RH Furniture Transfers Goal: LTG Patient will perform furniture transfers w/assist (OT/PT) Description: LTG: Patient will perform furniture transfers  with assistance (OT/PT). Outcome: Completed/Met

## 2021-07-11 NOTE — Progress Notes (Signed)
PROGRESS NOTE   Subjective/Complaints:  Remains aphasic , reviewed PT note at goal level for mobility but family ed pending for today  ROS: cannot obtain due to aphasia   Objective:   No results found. No results for input(s): WBC, HGB, HCT, PLT in the last 72 hours.  No results for input(s): NA, K, CL, CO2, GLUCOSE, BUN, CREATININE, CALCIUM in the last 72 hours.   Intake/Output Summary (Last 24 hours) at 07/11/2021 0733 Last data filed at 07/11/2021 0104 Gross per 24 hour  Intake 480 ml  Output --  Net 480 ml         Physical Exam: Vital Signs Blood pressure (!) 160/96, pulse 81, temperature 97.9 F (36.6 C), resp. rate 18, height 5\' 5"  (1.651 m), weight 62.6 kg, SpO2 96 %.    General: No acute distress Mood and affect are appropriate Heart: Regular rate and rhythm no rubs murmurs or extra sounds Lungs: Clear to auscultation, breathing unlabored, no rales or wheezes Abdomen: Positive bowel sounds, soft nontender to palpation, nondistended Extremities: No clubbing, cyanosis, or edema Skin: No evidence of breakdown, no evidence of rash  Musculoskeletal:     Cervical back: Normal range of motion. No rigidity.     Comments: 5/5 in LUE/LLE Considering difficulty testing RUE/RLE- at least 4/5- but couldn't comply with entire exam  Skin:    General: Skin is warm and dry.  Neurological:     Mental Status: She is alert.     Comments: Patient is alert.  No acute distress.  She is aphasic.  She would not follow commands but mimic some actions.    Psychiatric:     Comments: Sleepy esp after exam   Assessment/Plan: 1. Functional deficits which require 3+ hours per day of interdisciplinary therapy in a comprehensive inpatient rehab setting. Physiatrist is providing close team supervision and 24 hour management of active medical problems listed below. Physiatrist and rehab team continue to assess barriers to  discharge/monitor patient progress toward functional and medical goals  Care Tool:  Bathing    Body parts bathed by patient: Right arm, Left arm, Chest, Abdomen, Right upper leg, Left upper leg, Face   Body parts bathed by helper: Face, Right arm, Left arm, Chest, Abdomen, Front perineal area, Buttocks, Right upper leg, Left upper leg, Right lower leg, Left lower leg     Bathing assist Assist Level: Contact Guard/Touching assist     Upper Body Dressing/Undressing Upper body dressing   What is the patient wearing?: Pull over shirt    Upper body assist Assist Level: Supervision/Verbal cueing    Lower Body Dressing/Undressing Lower body dressing      What is the patient wearing?: Pants     Lower body assist Assist for lower body dressing: Supervision/Verbal cueing     Toileting Toileting    Toileting assist Assist for toileting: Supervision/Verbal cueing     Transfers Chair/bed transfer  Transfers assist  Chair/bed transfer activity did not occur: Safety/medical concerns  Chair/bed transfer assist level: Minimal Assistance - Patient > 75%     Locomotion Ambulation   Ambulation assist      Assist level: Minimal Assistance - Patient > 75%  Assistive device: Walker-rolling Max distance: 20   Walk 10 feet activity   Assist     Assist level: Minimal Assistance - Patient > 75%     Walk 50 feet activity   Assist Walk 50 feet with 2 turns activity did not occur: Refused         Walk 150 feet activity   Assist Walk 150 feet activity did not occur: Refused         Walk 10 feet on uneven surface  activity   Assist Walk 10 feet on uneven surfaces activity did not occur: Refused         Wheelchair     Assist Is the patient using a wheelchair?: Yes Type of Wheelchair: Manual Wheelchair activity did not occur: Refused         Wheelchair 50 feet with 2 turns activity    Assist    Wheelchair 50 feet with 2 turns activity did  not occur: Refused       Wheelchair 150 feet activity     Assist  Wheelchair 150 feet activity did not occur: Refused       Blood pressure (!) 160/96, pulse 81, temperature 97.9 F (36.6 C), resp. rate 18, height 5\' 5"  (1.651 m), weight 62.6 kg, SpO2 96 %.    Medical Problem List and Plan: 1. Functional deficits left-sided weakness and aphasia secondary to left MCA infarct with hemorrhagic conversion, embolic pattern likely due to new diagnosis of a flutter             -patient may  shower             -ELOS/Goals: 1/7 24/7 sup from family   Continue CIR,  2.  Impaired mobility: continue Lovenox             -antiplatelet therapy: Aspirin 81 mg daily.  Considering DOAC once hemorrhage resolved. 3. Pain Management: Tylenol as needed 4. Mood: Provide emotional support             -antipsychotic agents: N/A 5. Neuropsych: This patient is not capable of making decisions on her own behalf. 6. Skin/Wound Care: Routine skin checks 7. Fluids/Electrolytes/Nutrition: Routine in and outs with follow-up chemistries 8.  HTN:  metoprolol and lisinopril  Vitals:   07/10/21 1927 07/11/21 0544  BP: (!) 151/97 (!) 160/96  Pulse: 86 81  Resp: 18 18  Temp: 98.1 F (36.7 C) 97.9 F (36.6 C)  SpO2: 96% 96%  HR improving , systolic BP elevated increase lisinopril to 10mg  on 1/6, will need OP followup   9.  New diagnosis a flutter.  Rate controlled Lopressor 50mg  BID .  Continue aspirin. 10.  Incidental finding cerebral aneurysm.  Follow-up outpatient interventional radiology 11.  Borderline enlarged right tracheal bronchial mediastinal lymph node.  Consider CT chest as outpatient. 12. Lethargy: likely secondary to Xanax: d/ced Xanax. Resolved. Start B/C vitamin complex to promote stroke recovery 13 Prediabetic- is new dx- A1c 6.0- due to age, will see if team thinks should be aggressive on this. Place nursing order to provide only water- no drinks with added sugars 14. UTI: continue keflex 7  days- Completed  Start probiotic. UC with >100,000 Ecoli pansensitive 15. Sundowning at night: persists despite tx of UTI, did better with seroquel will resume qhs- no am somnolence      LOS: 10 days A FACE TO FACE EVALUATION WAS PERFORMED  Shawna Hopkins 07/11/2021, 7:33 AM

## 2021-07-11 NOTE — Progress Notes (Signed)
Inpatient Rehabilitation Care Coordinator Discharge Note   Patient Details  Name: Shawna Hopkins MRN: [id number] Date of Birth: 12-15-1927   Discharge location: Home  Length of Stay: 11 Days  Discharge activity level: Min/Mod  Home/community participation: Family  Patient response YT:[id number] Literacy - How often do you need to have someone help you when you read instructions, pamphlets, or other written material from your doctor or pharmacy?: Always  Patient response XN:ATFTDD Isolation - How often do you feel lonely or isolated from those around you?: Patient unable to respond  Services provided included: SW, Pharmacy, TR, CM, RN, SLP, PT, OT, RD, MD  Financial Services:  Financial Services Utilized: Medicare    Choices offered to/list presented to: [place] and family  Follow-up services arranged:  Home Health Home Health Agency: [facility]         Patient response to transportation need: Is the patient able to respond to transportation needs?: Yes In the past 12 months, has lack of transportation kept you from medical appointments or from getting medications?: No In the past 12 months, has lack of transportation kept you from meetings, work, or from getting things needed for daily living?: No    Comments (or additional information):  Patient/Family verbalized understanding of follow-up arrangements:  Yes  Individual responsible for coordination of the follow-up plan: Chas [phone]  Confirmed correct DME delivered: Andria Rhein 07/11/2021    Andria Rhein

## 2021-07-11 NOTE — Progress Notes (Addendum)
Patient ID: Shawna Hopkins, female   DOB: 06-Oct-1927, 88 y.o.   MRN: [id number]  [facility] referral sent to [facility]  SW waiting on response as of 07/11/2021

## 2021-07-11 NOTE — Progress Notes (Signed)
ANTICOAGULATION CONSULT NOTE - Initial Consult  Pharmacy Consult for Apixaban Indication: atrial fibrillation  No Known Allergies  Patient Measurements: Height: 5\' 5"  (165.1 cm) Weight: 62.6 kg (138 lb 0.1 oz) IBW/kg (Calculated) : 57  Vital Signs: Temp: 97.9 F (36.6 C) (01/06 1444) Temp Source: Oral (01/06 1444) BP: 143/75 (01/06 1444) Pulse Rate: 86 (01/06 1444)  Labs: No results for input(s): HGB, HCT, PLT, APTT, LABPROT, INR, HEPARINUNFRC, HEPRLOWMOCWT, CREATININE, CKTOTAL, CKMB, TROPONINIHS in the last 72 hours.  Estimated Creatinine Clearance: 35.8 mL/min (by C-G formula based on SCr of 0.92 mg/dL).   Medical History: Past Medical History:  Diagnosis Date   Hypertension     Medications:  Scheduled:   apixaban  2.5 mg Oral BID   B-complex with vitamin C  1 tablet Oral Daily   lisinopril  10 mg Oral Daily   metoprolol tartrate  50 mg Oral BID   pantoprazole  40 mg Oral Daily   QUEtiapine  25 mg Oral QHS   saccharomyces boulardii  250 mg Oral BID   senna-docusate  1 tablet Oral BID    Assessment: 88 years of age female admitted 12/24 with CVA and transforming hemorrhage now with resolution on repeat CT and ok from Dr. 1/25 (Neurology) to start DOAC. Pharmacy consult for Apixaban.   Age >80, wt >60kg but close, SCr <1.5.   Goal of Therapy:  Monitor platelets by anticoagulation protocol: Yes   Plan:  Prior day shift pharmacist and Pearlean Brownie, PA discussed earlier- decision was made for lower dose of Apixaban 2.5 mg po BID due to high risk for bleed although technically qualifies for 5 mg po BID. See prior progress note from Delle Reining, Delle Reining.  Will monitor with team.    [place], PharmD, BCPS, BCCCP Clinical Pharmacist Please refer to [facility] for [facility] Pharmacy numbers 07/11/2021,5:27 PM

## 2021-07-11 NOTE — Progress Notes (Signed)
Occupational Therapy Session Note  Patient Details  Name: Shawna Hopkins MRN: [id number] Date of Birth: 1928/06/12  Today's Date: 07/11/2021 OT Individual Time: 9449-6759 OT Individual Time Calculation (min): 53 min + 30 missed min due to CT   Short Term Goals: Week 1:  OT Short Term Goal 1 (Week 1): Pt will don shirt with mod A. OT Short Term Goal 1 - Progress (Week 1): Met OT Short Term Goal 2 (Week 1): Pt will complete toilet transfer with mod A. OT Short Term Goal 2 - Progress (Week 1): Met OT Short Term Goal 3 (Week 1): Pt will complete grooming routine at sink with min A. OT Short Term Goal 3 - Progress (Week 1): Met OT Short Term Goal 4 (Week 1): Pt will don pants with max A. OT Short Term Goal 4 - Progress (Week 1): Met Week 2:  OT Short Term Goal 1 (Week 2): STG = LTG 2/2 ELOS  Skilled Therapeutic Interventions/Progress Updates:    Session 1 (1638-4665): Pt received semi-reclined in bed with sons/daughter present for family education, no s/sx pain but expressed fatigue but, agreeable to therapy with encouragement. Session focus on self-care retraining, activity tolerance, family education, transfer retraining in prep for improved ADL/IADL/func mobility performance + decreased caregiver burden. Pt already dressed for the day. Reviewed pt ADL performance at overall S level with the RW with pt only occasionally need min cues to locate ADL items on her R side and set-up of environment. Came to sitting EOB and able to don B shoes with close S. Completed ambulatory toilet transfer and toileting tasks with close S and RW post continent void of bladder. Completed hand hygiene and hair care at sink with only one visual cue to locate soap on R.  Ambulated to and from ADL apartment with close S and RW. Able to path find with min visual cuing.  Completed TTB transfer with min A to initiate BLE over side of tub.   Family reports pt likes to cook oatmeal every morning for breakfast. Presented  with necessary items to make oatmeal, but pt requiring overall max A step-by-step visual instructions to initiate each step. Pt able to recognize she needed to add more water, but perseverating on "reading" measuring cup numbers. Required total A to utilize microwave, as well.  Emphasized need for 24/7 S to assist with IADL, med management, and for overall safety. Oriented to stroke book in room and common signs/sx stroke. Family verbalized understanding of recommendation, but will need to arrange 24/7 assist.  Pt left with [facility] over 30 degrees with family present with bed alarm engaged, call bell in reach, and all immediate needs met.    Session 2 attempt: Attempted to see pt at scheduled OT time, pt out of room for CT scan.     Therapy Documentation Precautions:  Precautions Precautions: Fall Precaution Comments: globally aphasic Restrictions Weight Bearing Restrictions: No  Pain: no s/sx throughout   ADL: See Care Tool for more details.  Therapy/Group: Individual Therapy  Volanda Napoleon MS, OTR/L  07/11/2021, 6:48 AM

## 2021-07-11 NOTE — Progress Notes (Signed)
CT head showed resolution of hemorrhage--per Dr. Pearlean Brownie ok to start DOAC. Discussed with Pharmacist--due to age and wt with patient being at boderline for decreased dose to err on caution with Eliquis 2.5 mg bid. Family informed.

## 2021-07-11 NOTE — Progress Notes (Signed)
Inpatient Rehabilitation Discharge Medication Review by a Pharmacist  A complete drug regimen review was completed for this patient to identify any potential clinically significant medication issues.  High Risk Drug Classes Is patient taking? Indication by Medication  Antipsychotic Yes Seroquel for sundowning  Anticoagulant No   Antibiotic No   Opioid No   Antiplatelet Yes Aspirin for CVA ppx  Hypoglycemics/insulin No   Vasoactive Medication Yes Metoprolol, lisinopril for Afib, BP  Chemotherapy No   Other Yes Protonix for GERD     Type of Medication Issue Identified Description of Issue Recommendation(s)  Drug Interaction(s) (clinically significant)     Duplicate Therapy     Allergy     No Medication Administration End Date     Incorrect Dose     Additional Drug Therapy Needed     Significant med changes from prior encounter (inform family/care partners about these prior to discharge).    Other       Clinically significant medication issues were identified that warrant physician communication and completion of prescribed/recommended actions by midnight of the next day:  No   Pharmacist comments: None  Time spent performing this drug regimen review (minutes):  20 minutes   Elwin Sleight 07/11/2021 8:48 AM

## 2021-07-11 NOTE — Progress Notes (Signed)
Patient ID: Shawna Hopkins, female   DOB: 1927/09/02, 88 y.o.   MRN: [id number]  [facility] referral provided to patient family.

## 2021-07-11 NOTE — Progress Notes (Signed)
Patient ID: Shawna Hopkins, female   DOB: 1927/07/15, 88 y.o.   MRN: [id number]  [facility] declined by [facility] due to staffing. HH list provided to family in patient room. Sw informed family again about 24/7 supervision recommendation.  [place], [place] [phone]

## 2021-07-11 NOTE — Plan of Care (Signed)
Problem: RH Swallowing °Goal: LTG Patient will consume least restrictive diet using compensatory strategies with assistance (SLP) °Description: LTG:  Patient will consume least restrictive diet using compensatory strategies with assistance (SLP) °Outcome: Completed/Met °Goal: LTG Pt will demonstrate functional change in swallow as evidenced by bedside/clinical objective assessment (SLP) °Description: LTG: Patient will demonstrate functional change in swallow as evidenced by bedside/clinical objective assessment (SLP) °Outcome: Completed/Met °  °Problem: RH Comprehension Communication °Goal: LTG Patient will comprehend basic/complex auditory (SLP) °Description: LTG: Patient will comprehend basic/complex auditory information with cues (SLP). °Outcome: Completed/Met °  °Problem: RH Expression Communication °Goal: LTG Patient will express needs/wants via multi-modal(SLP) °Description: LTG:  Patient will express needs/wants via multi-modal communication (gestures/written, etc) with cues (SLP) °Outcome: Completed/Met °  °Problem: RH Attention °Goal: LTG Patient will demonstrate this level of attention during functional activites (SLP) °Description: LTG:  Patient will will demonstrate this level of attention during functional activites (SLP) °Outcome: Completed/Met °  °

## 2021-07-11 NOTE — Progress Notes (Signed)
Physical Therapy Discharge Summary  Patient Details  Name: Shawna Hopkins MRN: [id number] Date of Birth: 1928/05/13  Today's Date: 07/11/2021 PT Individual Time: 1000-1045   45 min    Patient has met 7 of 7 long term goals due to improved activity tolerance, improved balance, improved postural control, increased strength, increased range of motion, ability to compensate for deficits, functional use of  right lower extremity, and improved attention.  Patient to discharge at an ambulatory level Supervision  with RW for in home mobility .   Patient's care partner is independent to provide the necessary physical and cognitive assistance at discharge.  Reasons goals not met: All PT goals met   Recommendation:  Patient will benefit from ongoing skilled PT services in home health setting to continue to advance safe functional mobility, address ongoing impairments in balance, safety, awareness, coordination, and minimize fall risk.  Equipment: No equipment provided  Reasons for discharge: treatment goals met and discharge from hospital  Patient/family agrees with progress made and goals achieved: Yes  PT treatment  Pt received sitting EOB and agreeable to PT. Family present for education. Sit<>stand transfers with RW and supervision assist throughout session. Gait training performed with RW throughout session and supervision assist for safety with cues for awareness of AD position for safety in turns x 118f, 125fand [id number] for need for supervision assist to allow safe access to all parts of home. Family reporting that pt will probably note have 24 hour supervision, so they are attempting to find care through aid service. Step management training to ascend 2 steps onto porch and into house without rail. Min assisyt and HHA or with QC x 4 with assist form PT and x 2 with assist form grandson. Pt reports need for toiletting. Able to complete with distant supervision assist from PT for  safety. Car transfer training with RW and Supervision assist for safety with min cues for sit>pivot technique. Patient returned to room and left sitting EOB with call bell in reach and all needs met.       PT Discharge    Precautions/Restrictions Fall  Pain Pain Assessment Pain Scale: Faces Faces Pain Scale: No hurt Pain Interference Pain Interference Pain Effect on Sleep: 8. Unable to answer Pain Interference with Therapy Activities: 8. Unable to answer Pain Interference with Day-to-Day Activities: 8. Unable to answer Vision/Perception    Mild R inattention Cognition Overall Cognitive Status: Impaired/Different from baseline Arousal/Alertness: Awake/alert Year:  (unable to respond due to global aphasia) Month:  (unable to respond due to global aphasia) Day of Week: Other (Comment) (unable to respond due to global aphasia) Attention: Sustained Sustained Attention: Impaired Memory:  (unable to assess due to global aphasia) Awareness: Impaired Awareness Impairment: Intellectual impairment Problem Solving: Impaired Problem Solving Impairment: Verbal basic;Functional basic Safety/Judgment: Impaired Sensation Sensation Light Touch: Appears Intact (unable to fully assess due to global aphasia) Hot/Cold: Appears Intact Proprioception: Appears Intact Stereognosis: Appears Intact Coordination Gross Motor Movements are Fluid and Coordinated: No (limited by generalized deconditioning and reliance on BUE support for dynamic tasks) Fine Motor Movements are Fluid and Coordinated: Yes Motor  Motor Motor: Other (comment) Motor - Discharge Observations: limited by generalized deconditioning and reliance on BUE support for dynamic tasks  Mobility Bed Mobility Bed Mobility: Supine to Sit;Sit to Supine Rolling Right: Independent Rolling Left: Independent Supine to Sit: Independent Sit to Supine: Independent Transfers Transfers: Sit to Stand;Stand to Sit;Stand Pivot Transfers Sit  to Stand: Supervision/Verbal cueing Stand to Sit: Supervision/Verbal  cueing Stand Pivot Transfers: Supervision/Verbal cueing Transfer (Assistive device): Rolling walker Locomotion  Gait Ambulation: Yes Gait Assistance: Supervision/Verbal cueing Gait Distance (Feet): 150 Feet Assistive device: Rolling walker Gait Assistance Details: Verbal cues for safe use of DME/AE Gait Gait: Yes Gait Pattern: Ataxic Stairs / Additional Locomotion Stairs: Yes Stairs Assistance: Supervision/Verbal cueing Stair Management Technique: Two rails Number of Stairs: 12 Height of Stairs: 6 Wheelchair Mobility Wheelchair Mobility: No  Trunk/Postural Assessment  Cervical Assessment Cervical Assessment: Exceptions to [facility] (forward head) Thoracic Assessment Thoracic Assessment: Exceptions to [facility] (rounded shoulders) Lumbar Assessment Lumbar Assessment: Exceptions to [facility] (posterior pelvic tilt) Postural Control Postural Control: Deficits on evaluation (heavy reliance on BUE support with dynamic standing tasks)  Balance Static Sitting Balance Static Sitting - Level of Assistance: 6: Modified independent (Device/Increase time) Dynamic Sitting Balance Dynamic Sitting - Level of Assistance: 5: Stand by assistance Static Standing Balance Static Standing - Level of Assistance: 5: Stand by assistance Dynamic Standing Balance Dynamic Standing - Balance Support: Bilateral upper extremity supported Dynamic Standing - Level of Assistance: 5: Stand by assistance Extremity Assessment      RLE Assessment RLE Assessment: Exceptions to [facility] General Strength Comments: grossly 4+/5 through functional movement LLE Assessment LLE Assessment: Within Functional Limits General Strength Comments: grossly 4+/5 through funcitonal movement    Lorie Phenix 07/12/2021, 7:47 AM

## 2021-07-12 NOTE — Progress Notes (Signed)
Patient being D/C today to personal home. Grandson arrived to take patient home. [place] educated on medication administration. Grandson understands the importance of administering Eliquis. [name] states his father is on Eliquis and will administer medication as ordered. [name] also educated on the importance of 24 hours a day care. [name] states he believes she will be just fine when she gets back to her own house. Encouraged grandson to have care at all times. Educated on bedside commode, walker and bathtub chair. No questions or concerns st this time. Patient leaving with grandson in personal car.

## 2021-07-12 NOTE — Discharge Summary (Signed)
Physician Discharge Summary  Patient ID: Shawna Hopkins MRN: [id number] DOB/AGE: [birth year] 86 y.o.  Admit date: 07/01/2021 Discharge date: 07/12/2021  Discharge Diagnoses:  Principal Problem:   Left middle cerebral artery stroke [facility]) DVT prophylaxis Hypertension New diagnosis atrial flutter Incidental finding cerebral aneurysm Borderline enlarged right tracheobronchial mediastinal lymph node E. coli UTI Sundowning Prediabetes  Discharged Condition: Stable  Significant Diagnostic Studies: CT HEAD WO CONTRAST (5MM)  Result Date: 07/11/2021 CLINICAL DATA:  Follow-up of left MCA territory infarct. EXAM: CT HEAD WITHOUT CONTRAST TECHNIQUE: Contiguous axial images were obtained from the base of the skull through the vertex without intravenous contrast. COMPARISON:  06/29/2021 FINDINGS: Brain: Hypodensity in the anterior left MCA territory, including the left frontal operculum, left insula, and mid left frontal lobe, consistent with expected evolution of previously noted left MCA infarct. Resolution of previously noted petechial hemorrhage. No acute hemorrhage, mass, mass effect, or midline shift. No new area of hypodensity to suggest acute infarct. Periventricular white matter changes, likely the sequela of chronic small vessel ischemic disease. No hydrocephalus or extra-axial collection. Vascular: No hyperdense vessel. Atherosclerotic calcifications in the intracranial carotid and vertebral arteries. Skull: No acute osseous abnormality. Sinuses/Orbits: Status post right lens replacement. Otherwise negative. Other: The mastoids are well aerated. IMPRESSION: 1. Hypodensity in the anterior left MCA territory, consistent with expected evolution of a previously noted left MCA territory infarct. Resolution of previously noted petechial hemorrhage. 2. No acute intracranial process. Electronically Signed   By: Merilyn Baba M.D.   On: 07/11/2021 14:22   CT HEAD WO CONTRAST (5MM)  Result Date:  06/29/2021 CLINICAL DATA:  88 year old female with left MCA M2/proximal M3 occlusion and MCA territory infarct. Left ICA 6 mm aneurysm. EXAM: CT HEAD WITHOUT CONTRAST TECHNIQUE: Contiguous axial images were obtained from the base of the skull through the vertex without intravenous contrast. COMPARISON:  Brain MRI, head CT 0251 hours today. CTA head and neck 06/28/2021. FINDINGS: Brain: No significant change an petechial hemorrhage in the left middle MCA territory, now most conspicuous on series 2, image 17. Regional cytotoxic edema corresponding to abnormal diffusion. No significant midline shift or intracranial mass effect. Stable gray-white matter differentiation elsewhere. No extra-axial hemorrhage. Vascular: Calcified atherosclerosis at the skull base. Distal left ICA 6 mm aneurysm occult by plain CT. Skull: No acute osseous abnormality identified. Sinuses/Orbits: Visualized paranasal sinuses and mastoids are clear. Other: No acute orbit or scalp soft tissue finding. IMPRESSION: 1. No significant change since 0251 hours in the left middle MCA territory petechial hemorrhage and cytotoxic edema. No significant mass effect. 2. No new intracranial abnormality. Distal left ICA 6 mm aneurysm occult by plain CT. Electronically Signed   By: Genevie Ann M.D.   On: 06/29/2021 10:35   CT HEAD WO CONTRAST  Result Date: 06/29/2021 CLINICAL DATA:  Stroke follow-up EXAM: CT HEAD WITHOUT CONTRAST TECHNIQUE: Contiguous axial images were obtained from the base of the skull through the vertex without intravenous contrast. COMPARISON:  06/28/2021 FINDINGS: Brain: There is small volume petechial hemorrhage of the left frontal lobe. No mass effect. There is generalized atrophy without lobar predilection. Hypodensity of the white matter is most commonly associated with chronic microvascular disease. Vascular: No abnormal hyperdensity of the major intracranial arteries or dural venous sinuses. No intracranial atherosclerosis. Skull:  The visualized skull base, calvarium and extracranial soft tissues are normal. Sinuses/Orbits: No fluid levels or advanced mucosal thickening of the visualized paranasal sinuses. No mastoid or middle ear effusion. The orbits are normal. IMPRESSION: 1. Small  volume petechial hemorrhage of the left frontal lobe without mass effect. 2. Hypodensity of the white matter is most commonly associated with chronic microvascular disease. Electronically Signed   By: Ulyses Jarred M.D.   On: 06/29/2021 03:07   MR BRAIN WO CONTRAST  Result Date: 06/28/2021 CLINICAL DATA:  Neuro deficit, acute, stroke suspected. EXAM: MRI HEAD WITHOUT CONTRAST TECHNIQUE: Multiplanar, multiecho pulse sequences of the brain and surrounding structures were obtained without intravenous contrast. COMPARISON:  Noncontrast head CT and CT angiogram head/neck 06/28/2021. FINDINGS: Brain: Mild generalized cerebral and cerebellar atrophy. Multiple acute cortical/subcortical infarcts within the mid-to-posterior left frontal lobe, left frontal operculum and left insula. The largest infarct measures 2.8 x 2.6 cm x 4.4 cm. There is gyriform SWI signal loss associated with this largest infarct compatible with petechial hemorrhage/hemorrhagic conversion. There is only mild local mass effect. A few small acute cortically-based infarcts are also present within the left parietal and temporal lobes. Background advanced patchy and confluent T2 FLAIR hyperintense signal abnormality within the cerebral white matter, nonspecific but compatible with chronic small vessel ischemic disease. Minimal chronic small-vessel ischemic changes are also present within the pons. Chronic infarcts within the bilateral basal ganglia. Punctate chronic microhemorrhages within the medial left parietooccipital lobes and left thalamus. Small chronic infarcts within the bilateral cerebellar hemispheres. No evidence of an intracranial mass. No extra-axial fluid collection. No midline shift.  Vascular: Curvilinear SWI signal loss at the posterior aspect of the left sylvian fissure, likely reflecting endoluminal thrombus within a distal M2/proximal M3 left MCA vessel, as described on the CTA performed earlier today. Flow voids otherwise preserved within the proximal large arterial vessels. Skull and upper cervical spine: No focal suspicious marrow lesion. Incompletely assessed cervical spondylosis. Sinuses/Orbits: Visualized orbits show no acute finding. Right lens replacement. Mild mucosal thickening within the bilateral ethmoid sinuses. Impression #1 was called by telephone at the time of interpretation on 06/28/2021 at 8:14 pm to provider Dr. Lorrin Goodell, who verbally acknowledged these results. IMPRESSION: Multiple acute cortical/subcortical left MCA territory infarcts within the left frontal, parietal and temporal lobes. The largest infarct within the mid-to-posterior left frontal lobe, left frontal operculum and left insula measures 2.8 x 2.6 x 4.4 cm. SWI signal loss is present at site of this dominant infarct, compatible with petechial hemorrhage/hemorrhagic conversion. There is only mild local mass effect. Background advanced chronic small vessel changes within the cerebral white matter. Mild chronic small vessel ischemic changes are also present within the pons. Chronic lacunar infarcts within the bilateral basal ganglia. Small chronic infarcts within the bilateral cerebellar hemispheres. Mild generalized parenchymal atrophy. Electronically Signed   By: Kellie Simmering D.O.   On: 06/28/2021 20:18   DG CHEST PORT 1 VIEW  Result Date: 06/30/2021 CLINICAL DATA:  Fever, stroke EXAM: PORTABLE CHEST 1 VIEW COMPARISON:  06/28/2021 FINDINGS: The heart size and mediastinal contours are within normal limits. Both lungs are clear. The visualized skeletal structures are unremarkable. IMPRESSION: No acute abnormality of the lungs in AP portable projection. Electronically Signed   By: Delanna Ahmadi M.D.    On: 06/30/2021 10:01   DG Chest Portable 1 View  Result Date: 06/28/2021 CLINICAL DATA:  New onset atrial fibrillation.  Acute stroke. EXAM: PORTABLE CHEST 1 VIEW COMPARISON:  02/29/2020 FINDINGS: Heart size upper limits of normal. Calcification and tortuosity of the aorta. Mildly prominent interstitial markings in the mid and lower lungs, improved since the study of February 29, 2020 where there was quite likely acute pneumonia. Today's markings could be chronic  or indicate mild active inflammatory disease. No consolidation or collapse. No effusion. Bony structures unremarkable. IMPRESSION: No consolidation, collapse, frank edema or effusion. Slightly prominent markings at the lung bases which may be residua of previous pneumonia. Electronically Signed   By: Nelson Chimes M.D.   On: 06/28/2021 16:28   ECHOCARDIOGRAM COMPLETE  Result Date: 06/29/2021    ECHOCARDIOGRAM REPORT   Patient Name:   Shawna Hopkins Date of Exam: 06/29/2021 Medical Rec #:  [id number]         Height:       65.0 in Accession #:    [id number]        Weight:       143.1 lb Date of Birth:  [birth year]         BSA:          1.716 m Patient Age:    22 years          BP:           129/64 mmHg Patient Gender: F                 HR:           77 bpm. Exam Location:  Inpatient Procedure: 2D Echo, Cardiac Doppler and Color Doppler Indications:    Stroke  History:        Patient has prior history of Echocardiogram examinations. Risk                 Factors:Hypertension.  Sonographer:    Clayton Lefort RDCS (AE) Referring Phys: [id number] [place]  1. Left ventricular ejection fraction, by estimation, is 60 to 65%. The left ventricle has normal function. The left ventricle has no regional wall motion abnormalities. There is moderate concentric left ventricular hypertrophy. Left ventricular diastolic function could not be evaluated.  2. Right ventricular systolic function is normal. The right ventricular size is normal. There is normal  pulmonary artery systolic pressure.  3. Left atrial size was moderately dilated.  4. Right atrial size was moderately dilated.  5. The mitral valve is normal in structure. Trivial mitral valve regurgitation. No evidence of mitral stenosis.  6. Tricuspid valve regurgitation is mild to moderate.  7. The aortic valve is tricuspid. There is mild calcification of the aortic valve. Aortic valve regurgitation is not visualized. Aortic valve sclerosis/calcification is present, without any evidence of aortic stenosis.  8. The inferior vena cava is normal in size with greater than 50% respiratory variability, suggesting right atrial pressure of 3 mmHg. FINDINGS  Left Ventricle: Left ventricular ejection fraction, by estimation, is 60 to 65%. The left ventricle has normal function. The left ventricle has no regional wall motion abnormalities. The left ventricular internal cavity size was normal in size. There is  moderate concentric left ventricular hypertrophy. Left ventricular diastolic function could not be evaluated due to atrial fibrillation. Left ventricular diastolic function could not be evaluated. Right Ventricle: The right ventricular size is normal. No increase in right ventricular wall thickness. Right ventricular systolic function is normal. There is normal pulmonary artery systolic pressure. The tricuspid regurgitant velocity is 2.44 m/s, and  with an assumed right atrial pressure of 8 mmHg, the estimated right ventricular systolic pressure is [id number] mmHg. Left Atrium: Left atrial size was moderately dilated. Right Atrium: Right atrial size was moderately dilated. Pericardium: There is no evidence of pericardial effusion. Mitral Valve: The mitral valve is normal in structure. There is mild calcification of the mitral valve leaflet(s). Trivial  mitral valve regurgitation. No evidence of mitral valve stenosis. MV peak gradient, 7.1 mmHg. The mean mitral valve gradient is 2.0  mmHg. Tricuspid Valve: The tricuspid valve  is normal in structure. Tricuspid valve regurgitation is mild to moderate. No evidence of tricuspid stenosis. Aortic Valve: The aortic valve is tricuspid. There is mild calcification of the aortic valve. Aortic valve regurgitation is not visualized. Aortic valve sclerosis/calcification is present, without any evidence of aortic stenosis. Aortic valve mean gradient measures 3.0 mmHg. Aortic valve peak gradient measures 5.0 mmHg. Aortic valve area, by VTI measures 1.77 cm. Pulmonic Valve: The pulmonic valve was not well visualized. Pulmonic valve regurgitation is not visualized. No evidence of pulmonic stenosis. Aorta: The aortic root is normal in size and structure. Venous: The inferior vena cava is normal in size with greater than 50% respiratory variability, suggesting right atrial pressure of 3 mmHg. IAS/Shunts: No atrial level shunt detected by color flow Doppler.  LEFT VENTRICLE PLAX 2D LVIDd:         3.40 cm   Diastology LVIDs:         2.30 cm   LV e' medial:    7.36 cm/s LV PW:         1.40 cm   LV E/e' medial:  16.7 LV IVS:        1.50 cm   LV e' lateral:   9.11 cm/s LVOT diam:     2.00 cm   LV E/e' lateral: 13.5 LV SV:         41 LV SV Index:   24 LVOT Area:     3.14 cm  RIGHT VENTRICLE             IVC RV Basal diam:  3.30 cm     IVC diam: 1.50 cm RV S prime:     11.90 cm/s TAPSE (M-mode): 1.2 cm LEFT ATRIUM           Index        RIGHT ATRIUM           Index LA diam:      2.90 cm 1.69 cm/m   RA Area:     15.50 cm LA Vol (A2C): 30.4 ml 17.72 ml/m  RA Volume:   39.00 ml  22.73 ml/m LA Vol (A4C): 56.6 ml 32.99 ml/m  AORTIC VALVE AV Area (Vmax):    1.97 cm AV Area (Vmean):   1.61 cm AV Area (VTI):     1.77 cm AV Vmax:           112.00 cm/s AV Vmean:          89.700 cm/s AV VTI:            0.229 m AV Peak Grad:      5.0 mmHg AV Mean Grad:      3.0 mmHg LVOT Vmax:         70.30 cm/s LVOT Vmean:        46.000 cm/s LVOT VTI:          0.129 m LVOT/AV VTI ratio: 0.56  AORTA Ao Root diam: 2.70 cm Ao Asc  diam:  2.90 cm MITRAL VALVE                TRICUSPID VALVE MV Area (PHT): 3.08 cm     TR Peak grad:   23.8 mmHg MV Area VTI:   1.05 cm     TR Vmax:        244.00 cm/s  MV Peak grad:  7.1 mmHg MV Mean grad:  2.0 mmHg     SHUNTS MV Vmax:       1.33 m/s     Systemic VTI:  0.13 m MV Vmean:      59.5 cm/s    Systemic Diam: 2.00 cm MV Decel Time: 246 msec MV E velocity: 123.00 cm/s MV A velocity: 30.40 cm/s MV E/A ratio:  4.05 Glori Bickers MD Electronically signed by Glori Bickers MD Signature Date/Time: 06/29/2021/4:21:57 PM    Final    CT HEAD CODE STROKE WO CONTRAST  Result Date: 06/28/2021 CLINICAL DATA:  Code stroke. Provided history: Neuro deficit, acute, stroke suspected; right-sided weakness. Additional history provided: Aphasia, right-sided weakness. EXAM: CT HEAD WITHOUT CONTRAST TECHNIQUE: Contiguous axial images were obtained from the base of the skull through the vertex without intravenous contrast. COMPARISON:  No pertinent prior exams available for comparison. FINDINGS: Brain: Mild generalized cerebral and cerebellar atrophy. Abnormal cortical/subcortical hypodensity with loss of gray-white differentiation within portions of the left frontal operculum and left insula, compatible with acute left MCA territory infarct. No significant mass effect at this time. No evidence of hemorrhagic conversion. Background advanced patchy and confluent hypoattenuation within the cerebral white matter, nonspecific but compatible with chronic small vessel ischemic disease. Small chronic infarcts within the basal ganglia and likely within the left cerebellar hemisphere. No extra-axial fluid collection. No evidence of an intracranial mass. No midline shift. Vascular: Asymmetric hyperdensity of a proximal M2 left MCA vessel suspicious for endoluminal thrombus at this site (for instance as seen on series 3, image 13). Atherosclerotic calcifications. Skull: Normal. Negative for fracture or focal lesion.  Sinuses/Orbits: Leftward gaze. Trace mucosal thickening within the bilateral ethmoid air cells. ASPECTS [facility] Stroke Program Early CT Score) - Ganglionic level infarction (caudate, lentiform nuclei, internal capsule, insula, M1-M3 cortex): 5 - Supraganglionic infarction (M4-M6 cortex): 2 Total score (0-10 with 10 being normal): 7 These results were called by telephone at the time of interpretation on 06/28/2021 at 3:38 pm to provider Dr. Quinn Axe, who verbally acknowledged these results. IMPRESSION: Acute left MCA territory cortical/subcortical infarct within the left frontal operculum and left insula. ASPECTS is 7. No evidence of hemorrhagic conversion. No significant mass effect at this time. Background advanced chronic small vessel ischemic changes within the cerebral white matter. Chronic infarcts within the bilateral basal ganglia and likely left cerebellar hemisphere. Mild generalized parenchymal atrophy. Electronically Signed   By: Kellie Simmering D.O.   On: 06/28/2021 15:41   CT ANGIO HEAD NECK W WO CM W PERF (CODE STROKE)  Result Date: 06/28/2021 CLINICAL DATA:  Neuro deficit, acute, stroke suspected. Additional history provided: Aphasia, right-sided weakness. EXAM: CT ANGIOGRAPHY HEAD AND NECK CT PERFUSION BRAIN TECHNIQUE: Multidetector CT imaging of the head and neck was performed using the standard protocol during bolus administration of intravenous contrast. Multiplanar CT image reconstructions and MIPs were obtained to evaluate the vascular anatomy. Carotid stenosis measurements (when applicable) are obtained utilizing NASCET criteria, using the distal internal carotid diameter as the denominator. Multiphase CT imaging of the brain was performed following IV bolus contrast injection. Subsequent parametric perfusion maps were calculated using RAPID software. CONTRAST:  148mL OMNIPAQUE IOHEXOL 350 MG/ML SOLN COMPARISON:  Noncontrast head CT performed earlier today 06/28/2021. FINDINGS: CTA NECK  FINDINGS Aortic arch: Common origin of the innominate and left common carotid arteries. Atherosclerotic plaque within the visualized aortic arch and proximal major branch vessels of the neck. No hemodynamically significant innominate or proximal subclavian artery stenosis. Right  carotid system: CCA and ICA patent within the neck without stenosis or significant atherosclerotic disease. Tortuosity of the cervical ICA. Left carotid system: CCA and ICA patent within the neck without hemodynamically significant stenosis (50% or greater). Minimal atherosclerotic plaque at the CCA origin and within the proximal ICA. Tortuosity of the cervical ICA. Vertebral arteries: Vertebral arteries codominant and patent within the neck. Mild nonstenotic atherosclerotic plaque at the origin of the right vertebral artery. Skeleton: Trace C2-C3 grade 1 retrolisthesis. Trace C7-T1 grade 1 anterolisthesis. Cervical spondylosis. C3-C4 and C4-C5 ankylosis. No acute bony abnormality or aggressive osseous lesion. Other neck: Subcentimeter nodules within the right thyroid lobe, not meeting consensus criteria for ultrasound follow-up based on size. No cervical lymphadenopathy. Upper chest: No consolidation within the imaged lung apices. Borderline enlarged right tracheobronchial lymph node measuring 11 mm in short axis (series 9, image 2). Review of the MIP images confirms the above findings CTA HEAD FINDINGS Anterior circulation: The intracranial internal carotid arteries are patent. Nonstenotic atherosclerotic plaque within both vessels. 6 x 6 mm inferomedially projecting saccular aneurysm arising from the distal cavernous/paraclinoid left ICA (for instance as seen on series 9, image 248) (series 12, image 116). Separate 2 mm inferiorly projecting vascular protrusion arising from the paraclinoid left ICA more laterally, which may reflect an infundibulum or additional aneurysm (for instance as seen on series 9, image 251) (series 12, image 120).  The M1 middle cerebral arteries are patent. No right M2 proximal branch occlusion or high-grade proximal stenosis is identified. There is occlusion of a distal M2/proximal M3 left MCA vessel (series 14, image 31) (series 10, image 56). The anterior cerebral arteries are patent. Posterior circulation: The intracranial vertebral arteries are patent. The basilar artery is patent. The posterior cerebral arteries are patent. A right posterior communicating artery is present. The left posterior communicating artery is diminutive or absent. Venous sinuses: Within the limitations of contrast timing, no convincing thrombus. Anatomic variants: As described. Review of the MIP images confirms the above findings CT Brain Perfusion Findings: CBF (<30%) Volume: 63mL Perfusion (Tmax>6.0s) volume: 24mL (within the posterior left MCA vascular territory). Mismatch Volume: 54mL Infarction Location:None identified Emergent findings discussed with Dr. Quinn Axe at 4 p.m. on 06/28/2021 by telephone. IMPRESSION: CTA neck: 1. The common carotid, internal carotid and vertebral arteries are patent within the neck without significant stenosis. Mild atherosclerotic disease within the major arterial vessels of the neck, as described. 2.  Aortic Atherosclerosis (ICD10-I70.0). 3. Partially imaged borderline enlarged right tracheobronchial mediastinal lymph node, nonspecific. Non-emergent dedicated CT imaging of the chest may be obtained for further evaluation, as clinically warranted. CTA head: 1. Occlusion of a distal M2/proximal M3 left MCA vessel. 2. Non-stenotic atherosclerotic plaque within the intracranial internal carotid arteries, bilaterally. 3. 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 4. Adjacent 2 mm aneurysm versus infundibulum arising from the paraclinoid left ICA. CT perfusion head: The perfusion software identifies a 10 mL region of critically hypoperfused parenchyma within the  posterior left MCA vascular territory ( Tmax>6 seconds threshold). The perfusion software identifies no core infarct. However, there are clear changes of acute infarction on the concurrently performed noncontrast head CT. The perfusion software reports a mismatch volume of 10 mL. Electronically Signed   By: Kellie Simmering D.O.   On: 06/28/2021 16:14    Labs:  Basic Metabolic Panel: No results for input(s): NA, K, CL, CO2, GLUCOSE, BUN, CREATININE, CALCIUM, MG, PHOS in the last 168 hours.  CBC: No  results for input(s): WBC, NEUTROABS, HGB, HCT, MCV, PLT in the last 168 hours.  CBG: No results for input(s): GLUCAP in the last 168 hours.  Family history.  Positive for hypertension as well as hyperlipidemia.  Denies any colon cancer esophageal cancer or rectal cancer  Brief HPI:   Shawna Hopkins is a 88 y.o. right-handed female with history of hypertension as well as GERD.  Per chart review patient lives alone.  Daughter reports independent prior to admission.  Family does her grocery shopping and takes her to the necessary appointments.  Presented to [facility]/28/2021 with right side weakness and global aphasia.  CT/MRI showed multiple acute cortical subcortical left MCA territory infarcts within the left frontal parietal and temporal lobes.  Largest infarct within the mid to posterior left frontal lobe, left frontal operculum and left insula measuring 2.8 x 2.6 x 4.4 cm.  SWI signal loss present at site of dominant infarct compatible with petechial hemorrhage/hemorrhagic conversion.  Only mild mass-effect noted.  CT angiogram head and neck occlusion of distal M2 proximal M3 left MCA vessel.  Patient did not receive tPA.  EKG did show new onset atrial fibrillation with rate controlled.  She was transferred to [facility].  Echocardiogram with ejection fraction of 60 to 65% no wall motion abnormalities.  Follow-up cranial CT scan 06/29/2021 showed no significant change.  No new intracranial  abnormality.  Incidental finding of 6 x 6 mm aneurysm projecting from the distal cavernous paraclinoid left ICA discussed with interventional radiology follow-up outpatient.  She was cleared to begin aspirin for DVT prophylaxis consider DOAC once hemorrhage resolved with follow-up cranial CT scan.  She was cleared to begin Lovenox for DVT prophylaxis 06/30/2021.  Therapy evaluations completed due to patient's right side weakness and aphasia was admitted for a comprehensive rehab program.   Hospital Course: Shawna Hopkins was admitted to rehab 07/01/2021 for inpatient therapies to consist of PT, ST and OT at least three hours five days a week. Past admission physiatrist, therapy team and rehab RN have worked together to provide customized collaborative inpatient rehab.  Pertain to patient's left MCA infarct hemorrhagic conversion embolic pattern likely due to new diagnosis atrial flutter remained stable.  Initially maintained on aspirin therapy discussed with neurology services transitioned to Eliquis after follow-up cranial CT scan completed showing hypodensity anterior left MCA territory consistent with expected evolution previously noted left MCA territory infarct.  Resolution of previously noted petechial hemorrhage.  Blood pressure controlled on metoprolol as well as lisinopril will need outpatient follow-up.  New diagnosis atrial flutter continue beta-blocker no chest pain or shortness of breath.  Incidental finding cerebral aneurysm follow-up outpatient interventional radiology after discussing with neurology services.  Borderline enlarged right tracheobronchial mediastinal lymph node consider CT of chest as outpatient.  Prediabetic new diagnosis A1c 6.0 diet changes were made with no added sugars.  Hospital course UTI E. coli completing course of Keflex.  Sundowning at night placed on Seroquel monitoring for any somnolence.   Blood pressures were monitored on TID basis and controlled      Rehab  course: During patient's stay in rehab weekly team conferences were held to monitor patient's progress, set goals and discuss barriers to discharge. At admission, patient required moderate assist 30 feet 2 person hand-held assist moderate assist stand pivot transfers  Physical exam.  Blood pressure 154/109 pulse 96 temperature 97.7 respirations 18 oxygen saturations 95% room air Constitutional.  No acute distress HEENT Head.  Normocephalic and atraumatic Eyes.  Pupils round and reactive to light no discharge.nystagmus Neck.  Supple nontender no JVD without thyromegaly Cardiac regular rate rhythm any extra sounds or murmur heard Abdomen.  Soft nontender positive bowel sounds without rebound Respiratory effort normal no respiratory distress without wheeze Musculoskeletal.  Normal range of motion Comments.  5/5 left upper left lower extremity Right upper as well as right lower extremity at least 4/5 patient had difficulty complying with exam Neurologic.  Alert aphasic she would not follow commands but mimics some actions.  He/She  has had improvement in activity tolerance, balance, postural control as well as ability to compensate for deficits. He/She has had improvement in functional use RUE/LUE  and RLE/LLE as well as improvement in awareness working with improved activity tolerance improved balance improved postural control increase strength.  Sit to stand transfers rolling walker supervision assist 2 sessions.  Gait training performed rolling walker throughout session supervision assist for safety with cues ambulating 150 feet.  ADL session focused on self-care retraining activity tolerance family education.  Completed amatory toilet transfers and toileting task close supervision rolling walker.  Ambulates to and from ADL apartment close supervision rolling walker.  Speech therapy follow-up education provided with family on expressive receptive language deficits communication strategies.  Full  family teaching completed plan discharged home.       Disposition: Discharged home    Diet: Soft  Special Instructions: No driving smoking or alcohol  Medications at discharge 1.  Eliquis 2.5 mg p.o. twice daily 2.  B complex with vitamin C tablet daily 3.  Lisinopril 10 mg p.o. daily 4.  Metoprolol 50 mg p.o. twice daily 5.  Protonix 40 mg p.o. daily 6.  Seroquel 25 mg p.o. nightly 7.  Florastor 250 mg p.o. twice daily 8.  Senokot S1 tablet p.o. twice daily  30-35 minutes were spent completing discharge summary and discharge planning  Discharge Instructions     Ambulatory referral to Interventional Radiology   Complete by: As directed    Follow up on aneurysm   Ambulatory referral to Neurology   Complete by: As directed    An appointment is requested in approximately 4 weeks left MCA infarction   Ambulatory referral to Physical Medicine Rehab   Complete by: As directed    Hospital follow up      Allergies as of 07/12/2021   No Known Allergies      Medication List     STOP taking these medications    aspirin 81 MG EC tablet       TAKE these medications    apixaban 2.5 MG Tabs tablet Commonly known as: ELIQUIS Take 1 tablet (2.5 mg total) by mouth 2 (two) times daily.   B-complex with vitamin C tablet Take 1 tablet by mouth daily.   lisinopril 10 MG tablet Commonly known as: ZESTRIL Take 1 tablet (10 mg total) by mouth daily.   metoprolol tartrate 50 MG tablet Commonly known as: LOPRESSOR Take 1 tablet (50 mg total) by mouth 2 (two) times daily. What changed:  medication strength how much to take   pantoprazole 40 MG tablet Commonly known as: PROTONIX Take 1 tablet (40 mg total) by mouth daily.   QUEtiapine 25 MG tablet Commonly known as: SEROQUEL Take 1 tablet (25 mg total) by mouth at bedtime.   saccharomyces boulardii 250 MG capsule Commonly known as: FLORASTOR Take 1 capsule (250 mg total) by mouth 2 (two) times daily.    Senexon-S 8.6-50 MG tablet Generic drug: senna-docusate Take 1  tablet by mouth 2 (two) times daily.        Follow-up Information     Kirsteins, Luanna Salk, MD Follow up.   Specialty: Physical Medicine and Rehabilitation Why: Office to call for appointment Contact information: [place] [address] [phone]         Luanne Bras, MD .   Specialties: Interventional Radiology, Radiology Why: call for follow up appointment Contact information: [place] [address] [phone]         [facility] Follow up.   Why: office will call you with follow up appointment Contact information: [address]     [address] [place] [phone] [place], [facility]. Call.   Specialty: General Practice Contact information: [place] [address] [phone]                 Signed: Lavon Paganini [place] 07/12/2021, 11:09 AM

## 2021-07-12 NOTE — Plan of Care (Signed)
Problem: RH Balance Goal: LTG Patient will maintain dynamic standing balance (PT) Description: LTG:  Patient will maintain dynamic standing balance with assistance during mobility activities (PT) Outcome: Completed/Met   Problem: RH Bed Mobility Goal: LTG Patient will perform bed mobility with assist (PT) Description: LTG: Patient will perform bed mobility with assistance, with/without cues (PT). Outcome: Completed/Met   Problem: RH Car Transfers Goal: LTG Patient will perform car transfers with assist (PT) Description: LTG: Patient will perform car transfers with assistance (PT). Outcome: Completed/Met   Problem: RH Ambulation Goal: LTG Patient will ambulate in controlled environment (PT) Description: LTG: Patient will ambulate in a controlled environment, # of feet with assistance (PT). Outcome: Completed/Met Goal: LTG Patient will ambulate in home environment (PT) Description: LTG: Patient will ambulate in home environment, # of feet with assistance (PT). Outcome: Completed/Met   Problem: RH Stairs Goal: LTG Patient will ambulate up and down stairs w/assist (PT) Description: LTG: Patient will ambulate up and down # of stairs with assistance (PT) Outcome: Completed/Met

## 2021-07-12 NOTE — Progress Notes (Signed)
Patient woke up from sleep and pointing on the clock. Pt informed that it is still early and will go home later. Patient walked in the hallway and later redirected back to room. Patient adamant to lay back in bed and was resisting staff. Grandson contacted and spoke with patient. Patient agreed to lay down and music played. RN  stayed at bedside and patient remained calm.

## 2021-07-12 NOTE — Progress Notes (Signed)
Inpatient Rehabilitation Discharge Medication Review by a Pharmacist - Follow Up  A complete drug regimen review was completed for this patient to identify any potential clinically significant medication issues.  High Risk Drug Classes Is patient taking? Indication by Medication  Antipsychotic Yes Quetiapine for sundowning  Anticoagulant Yes Apixaban for Afib  Antibiotic No   Opioid No   Antiplatelet No   Hypoglycemics/insulin No   Vasoactive Medication Yes Metoprolol for Afib, HTN Lisinopril for HTN  Chemotherapy No   Other Yes Pantoprazole for GERD     Type of Medication Issue Identified Description of Issue Recommendation(s)  Drug Interaction(s) (clinically significant)     Duplicate Therapy     Allergy     No Medication Administration End Date     Incorrect Dose     Additional Drug Therapy Needed     Significant med changes from prior encounter (inform family/care partners about these prior to discharge).    Other       Clinically significant medication issues were identified that warrant physician communication and completion of prescribed/recommended actions by midnight of the next day:  No   Pharmacist comments: None  Time spent performing this drug regimen review (minutes):  20 minutes   Jerrilyn Cairo, PharmD PGY1 Pharmacy Resident 07/12/2021 7:16 AM   Please check AMION for all [facility] Pharmacy phone numbers After 10:00 PM, call Main Pharmacy [phone]

## 2021-07-14 ENCOUNTER — Encounter: Payer: Self-pay | Admitting: Physical Medicine & Rehabilitation

## 2021-07-17 ENCOUNTER — Telehealth: Payer: Self-pay

## 2021-07-17 NOTE — Telephone Encounter (Signed)
Sherine, OT from [facility] called requesting HHOT 1wk2, 2wk2, 1wk1. Orders approved and given.

## 2021-07-23 ENCOUNTER — Other Ambulatory Visit (HOSPITAL_COMMUNITY): Payer: Self-pay

## 2021-07-23 ENCOUNTER — Telehealth (HOSPITAL_COMMUNITY): Payer: Self-pay

## 2021-07-23 NOTE — Telephone Encounter (Signed)
Pharmacy Transitions of Care Follow-up Telephone Call  Date of discharge: 07/12/21  Discharge Diagnosis: CVA  How have you been since you were released from the hospital?    Medication changes made at discharge:  - START: Eliquis  - STOPPED:   - CHANGED:   Medication changes verified by the patient? Yes    Medication Accessibility:  Home Pharmacy:    Was the patient provided with refills on discharged medications? no   Have all prescriptions been transferred from [facility] to home pharmacy? no   Is the patient able to afford medications? Yes Notable copays:  Eligible patient assistance:     Medication Review:    APIXABAN (ELIQUIS)  Apixaban 10 mg BID initiated on . Will switch to apixaban 5 mg BID after 7 days (DATE).  - Discussed importance of taking medication around the same time everyday  - Reviewed potential DDIs with patient  - Advised patient of medications to avoid (NSAIDs, ASA)  - Educated that Tylenol (acetaminophen) will be the preferred analgesic to prevent risk of bleeding  - Emphasized importance of monitoring for signs and symptoms of bleeding (abnormal bruising, prolonged bleeding, nose bleeds, bleeding from gums, discolored urine, black tarry stools)  - Advised patient to alert all providers of anticoagulation therapy prior to starting a new medication or having a procedure   Follow-up Appointments:  PCP Hospital f/u appt confirmed? Friday 07/26/21 [facility]   If their condition worsens, is the pt aware to call PCP or go to the Emergency Dept.? yes  Final Patient Assessment: I spoke with the patient's grandaughter who was filling in for her mother to get some rest after caretaking overnight. The patient's daughter is administering medications and taking the patient to appts. The patient is only taking Tylenol for pain at this time-no ibu or advil or aleve. I advised to check with the PCP on Friday about the duration of therapy with Eliquis since there are  no refills.

## 2021-07-24 ENCOUNTER — Other Ambulatory Visit (HOSPITAL_COMMUNITY): Payer: Self-pay

## 2021-07-31 ENCOUNTER — Other Ambulatory Visit (HOSPITAL_COMMUNITY): Payer: Self-pay | Admitting: Interventional Radiology

## 2021-07-31 ENCOUNTER — Telehealth (HOSPITAL_COMMUNITY): Payer: Self-pay

## 2021-07-31 DIAGNOSIS — I671 Cerebral aneurysm, nonruptured: Secondary | ICD-10-CM

## 2021-07-31 NOTE — Telephone Encounter (Signed)
Called to schedule consult, no answer, left vm. AW  

## 2021-08-14 ENCOUNTER — Ambulatory Visit (HOSPITAL_COMMUNITY)
Admission: RE | Admit: 2021-08-14 | Discharge: 2021-08-14 | Disposition: A | Discharge status: Home / Self Care | Payer: Medicare Other | Source: Ambulatory Visit | Attending: Interventional Radiology | Admitting: Interventional Radiology

## 2021-08-14 ENCOUNTER — Other Ambulatory Visit: Payer: Self-pay

## 2021-08-14 DIAGNOSIS — I671 Cerebral aneurysm, nonruptured: Secondary | ICD-10-CM

## 2021-08-15 HISTORY — PX: IR RADIOLOGIST EVAL & MGMT: IMG5224

## 2021-08-29 ENCOUNTER — Encounter: Payer: Medicare Other | Admitting: Physical Medicine & Rehabilitation

## 2021-09-16 ENCOUNTER — Ambulatory Visit (INDEPENDENT_AMBULATORY_CARE_PROVIDER_SITE_OTHER): Payer: Medicare Other | Admitting: Adult Health

## 2021-09-16 ENCOUNTER — Encounter: Payer: Self-pay | Admitting: Adult Health

## 2021-09-16 ENCOUNTER — Telehealth: Payer: Self-pay | Admitting: Adult Health

## 2021-09-16 ENCOUNTER — Other Ambulatory Visit: Payer: Self-pay

## 2021-09-16 VITALS — BP 162/94 | HR 86 | Wt 143.0 lb

## 2021-09-16 DIAGNOSIS — M545 Low back pain, unspecified: Secondary | ICD-10-CM | POA: Diagnosis not present

## 2021-09-16 DIAGNOSIS — G8929 Other chronic pain: Secondary | ICD-10-CM

## 2021-09-16 DIAGNOSIS — I619 Nontraumatic intracerebral hemorrhage, unspecified: Secondary | ICD-10-CM | POA: Diagnosis not present

## 2021-09-16 DIAGNOSIS — I69320 Aphasia following cerebral infarction: Secondary | ICD-10-CM

## 2021-09-16 DIAGNOSIS — Z09 Encounter for follow-up examination after completed treatment for conditions other than malignant neoplasm: Secondary | ICD-10-CM

## 2021-09-16 NOTE — Progress Notes (Signed)
?Shawna Hopkins ?[address] ?[place]. [place] [address] ?[phone] ? ?     HOSPITAL FOLLOW UP NOTE ? ?Shawna Hopkins ?Date of Birth:  February 22, 1928 ?Medical Record Number:  [id number]  ? ?Reason for Referral:  hospital stroke follow up ? ? ? ?SUBJECTIVE: ? ? ?CHIEF COMPLAINT:  ?Chief Complaint  ?Patient presents with  ? Follow-up  ?  RM 3 with grandson [place] and son  Trilby Drummer  ?Pt is well and stable, just having some aphasia due to self discontinuing speech therapy   ? ? ?HPI:  ? ?[name] is a 88 y.o. female with history of HTN and GERD who presented on 06/29/2021 with aphasia and right sided weakness.  Personally reviewed hospitalization pertinent progress notes, lab work and imaging.  Evaluated by Dr. Erlinda Hong for left MCA infarct with hemorrhagic conversion, embolic pattern likely due to new diagnosis of A-fib/a flutter.  Placed on aspirin 81 mg daily due to HT but will need to transition to [place] once hemorrhage resolves.  CTA head/neck no significant stenosis in neck and occlusion of distal left M2 and proximal M3 MCA vessel as well as 6x69mm aneurysm of distal cavernous/paraclinoid left ICA and recommended follow-up outpatient with Dr. Estanislado Pandy.  In addition to acute infarcts, MRI also showed chronic lacunar BG infarcts and small chronic infarcts in cerebellar hemispheres.  EF 60 to 65%.  Repeat CTH no significant change to left MCA hemorrhage.  LDL 69.  A1c 6.0.  Discharged to CIR on 07/01/2021 per therapy recommendations. During CIR admission, repeat CTH showed resolution of HT and initiated Eliquis prior to discharge. ? ? ?Today, 09/16/2021, patient being seen for initial hospital follow-up accompanied by her son, Trilby Drummer, and grandson, [place].  Family provides history.  Overall doing well since discharge.  Recovered well physically returning to baseline physical functioning. Shawna Hopkins does mention over the past week she has had a flare up of chronic back pain requiring use of cane.  Initially speech improved after discharge but has since plateaued.  She completed HH PT/OT and declined additional SLP visits as she was getting frustrated with therapy sessions. She lives in her own home with very supportive family who lives close by and assist as needed.  Able to maintain ADLs independently and some simple IADLs. Son unsure if still having confusion as she tried to tell him something recently and words did not make any sense. Per grandson, believes she understands majority of what is being said to her.  She reads the newspaper daily. Denies new stroke/TIA symptoms.  Compliant on Eliquis 2.5 mg twice daily without side effects.  Blood pressure today elevated with some improvement on recheck. Not routinely monitored at home. Has since seen PCP. Cancelled f/u visit with PMR due to driving distance.  No further concerns at this time. ? ? ? ? ?PERTINENT IMAGING/LABS ? ?Per hospitalization 06/29/2021 ?Code Stroke CT head Acute left MCA territory cortical/subcortical infarct with no evidence of hemorrhagic cnversion. Small vessel disease. Atrophy. ASPECTS 7 ?CTA head & neck No significant stenosis of vessels in the neck, Occlusion of distal M2, proximal M3 left MCA vessel ?CT perfusion 10 mL region of hypoperfused parenchyma with no core infarct identified ?MRI  Multiple acute cortical/subcortical infarcts in left MCA territory with hemorrhagic conversion, chornic lacunar basal ganglia infarcts, small chronic infarcts in cerebellar hemispheres  ?2D Echo EF 60 to 65% ?Repeat [facility] 06/29/2021 no significant change in left middle MCA hemorrhage ?Repeat [facility] 07/11/2021 resolution of previously noted petechial hemorrhage ?  LDL 69 ?HgbA1c 6.0 ? ? ? ? ? ?ROS:   ?N/A d/t language barrier ? ?PMH:  ?Past Medical History:  ?Diagnosis Date  ? Hypertension   ? ? ?PSH:  ?Past Surgical History:  ?Procedure Laterality Date  ? ABDOMINAL SURGERY    ? Galbladder  ? IR RADIOLOGIST EVAL & MGMT  08/15/2021  ? ? ?Social History:   ?Social History  ? ?Socioeconomic History  ? Marital status: Widowed  ?  Spouse name: Not on file  ? Number of children: Not on file  ? Years of education: Not on file  ? Highest education level: Not on file  ?Occupational History  ? Not on file  ?Tobacco Use  ? Smoking status: Never  ? Smokeless tobacco: Never  ?Substance and Sexual Activity  ? Alcohol use: Not Currently  ? Drug use: Never  ? Sexual activity: Not on file  ?Other Topics Concern  ? Not on file  ?Social History Narrative  ? Not on file  ? ?Social Determinants of Health  ? ?Financial Resource Strain: Not on file  ?Food Insecurity: Not on file  ?Transportation Needs: Not on file  ?Physical Activity: Not on file  ?Stress: Not on file  ?Social Connections: Not on file  ?Intimate Partner Violence: Not on file  ? ? ?Family History: History reviewed. No pertinent family history. ? ?Medications:   ?Current Outpatient Medications on File Prior to Visit  ?Medication Sig Dispense Refill  ? apixaban (ELIQUIS) 2.5 MG TABS tablet Take 1 tablet (2.5 mg total) by mouth 2 (two) times daily. 60 tablet 0  ? B Complex-C (B-COMPLEX WITH VITAMIN C) tablet Take 1 tablet by mouth daily. 30 tablet 0  ? lisinopril (ZESTRIL) 10 MG tablet Take 1 tablet (10 mg total) by mouth daily. 30 tablet 0  ? metoprolol tartrate (LOPRESSOR) 50 MG tablet Take 1 tablet (50 mg total) by mouth 2 (two) times daily. 60 tablet 0  ? pantoprazole (PROTONIX) 40 MG tablet Take 1 tablet (40 mg total) by mouth daily. 30 tablet 0  ? QUEtiapine (SEROQUEL) 25 MG tablet Take 1 tablet (25 mg total) by mouth at bedtime. 30 tablet 0  ? saccharomyces boulardii (FLORASTOR) 250 MG capsule Take 1 capsule (250 mg total) by mouth 2 (two) times daily. 60 capsule 0  ? senna-docusate (SENOKOT-S) 8.6-50 MG tablet Take 1 tablet by mouth 2 (two) times daily. 60 tablet 0  ? ?No current facility-administered medications on file prior to visit.  ? ? ?Allergies:  No Known Allergies ? ? ? ?OBJECTIVE: ? ?Physical  Exam ? ?Vitals:  ? 09/16/21 1014 09/16/21 1103  ?BP: (!) 191/109 (!) 162/94  ?Pulse: 86   ?Weight: 143 lb (64.9 kg)   ? ?Body mass index is 23.8 kg/m?Marland Kitchen ?No results found. ? ? ?General: well developed, well nourished, very pleasant elderly Caucasian female, seated, in no evident distress ?Head: head normocephalic and atraumatic.   ?Neck: supple with no carotid or supraclavicular bruits ?Cardiovascular: regular rate and rhythm, no murmurs ?Musculoskeletal: no deformity ?Skin:  no rash/petichiae ?Vascular:  Normal pulses all extremities ?  ?Neurologic Exam ?Mental Status: Awake and fully alert. Moderate to severe expressive aphasia and mild receptive aphasia. Able to repeat words but difficulty naming objects. Use of repetitive words - able to repeat her name but once asked her DOB, she said her name again. Unable to assess cognition.  Mood and affect appropriate.  ?Cranial Nerves: Fundoscopic exam reveals sharp disc margins. Pupils equal, briskly reactive to light. Extraocular  movements full without nystagmus. Visual fields full to confrontation. Hearing intact. Facial sensation intact. Face, tongue, palate moves normally and symmetrically.  ?Motor: Normal bulk and tone. Normal strength in all tested extremity muscles ?Sensory.: intact to touch , pinprick , position and vibratory sensation.  ?Coordination: Rapid alternating movements normal in all extremities. Finger-to-nose and heel-to-shin performed accurately bilaterally. ?Gait and Station: Arises from chair with mild difficulty. Stance is slightly hunched. Wide based gait with mild imbalance and use of cane.  Tandem walking heel toe not attempted.   ?Reflexes: 1+ and symmetric. Toes downgoing.  ? ? ? ? ? ?NIHSS  4 ?Modified Rankin  3 ? ? ? ? ? ?ASSESSMENT: KERI DUEL is a 88 y.o. year old female with left MCA infarct with hemorrhagic conversion on [id number], embolic pattern likely due to new diagnosis of A-fib/a flutter. Vascular risk factors include new  diagnosis of A-fib/a flutter, advanced age, prior strokes on imaging and L ICA cerebral aneurysm.  ? ? ? ? ?PLAN: ? ?Left MCA stroke with HT:  ?Residual deficit: Expressive> receptive aphasia. Referral placed to restart [facility]

## 2021-09-16 NOTE — Patient Instructions (Addendum)
Referral placed to restart home health therapies  ? ?Continue  Eliquis 2.5mg  twice daily   for secondary stroke prevention ? ?Continue to follow with Dr. Estanislado Pandy for monitoring of cerebral aneurysm ? ?Continue to follow up with PCP regarding blood pressure management  ?Maintain strict control of hypertension with blood pressure goal below 130/90 ? ?Signs of a Stroke? Follow the BEFAST method:  ?Balance Watch for a sudden loss of balance, trouble with coordination or vertigo ?Eyes Is there a sudden loss of vision in one or both eyes? Or double vision?  ?Face: Ask the person to smile. Does one side of the face droop or is it numb?  ?Arms: Ask the person to raise both arms. Does one arm drift downward? Is there weakness or numbness of a leg? ?Speech: Ask the person to repeat a simple phrase. Does the speech sound slurred/strange? Is the person confused ? ?Time: If you observe any of these signs, call 911. ? ? ?Recovered very well physically recovered very pleasant elderly Caucasian female, moderate to severe aphasia mild and family ? ? ? ? ?Thank you for coming to see [place] at [facility]. I hope we have been able to provide you high quality care today. ? ?You may receive a patient satisfaction survey over the next few weeks. We would appreciate your feedback and comments so that we may continue to improve ourselves and the health of our patients. ? ? ? ?Stroke Prevention ?Some medical conditions and lifestyle choices can lead to a higher risk for a stroke. You can help to prevent a stroke by eating healthy foods and exercising. It also helps to not smoke and to manage any health problems you may have. ?How can this condition affect me? ?A stroke is an emergency. It should be treated right away. A stroke can lead to brain damage or threaten your life. There is a better chance of surviving and getting better after a stroke if you get medical help right away. ?What can increase my risk? ?The following  medical conditions may increase your risk of a stroke: ?Diseases of the heart and blood vessels (cardiovascular disease). ?High blood pressure (hypertension). ?Diabetes. ?High cholesterol. ?Sickle cell disease. ?Problems with blood clotting. ?Being very overweight. ?Sleeping problems (obstructivesleep apnea). ?Other risk factors include: ?Being older than age 64. ?A history of blood clots, stroke, or mini-stroke (TIA). ?Race, ethnic background, or a family history of stroke. ?Smoking or using tobacco products. ?Taking birth control pills, especially if you smoke. ?Heavy alcohol and drug use. ?Not being active. ?What actions can I take to prevent this? ?Manage your health conditions ?High cholesterol. ?Eat a healthy diet. If this is not enough to manage your cholesterol, you may need to take medicines. ?Take medicines as told by your doctor. ?High blood pressure. ?Try to keep your blood pressure below 130/80. ?If your blood pressure cannot be managed through a healthy diet and regular exercise, you may need to take medicines. ?Take medicines as told by your doctor. ?Ask your doctor if you should check your blood pressure at home. ?Have your blood pressure checked every year. ?Diabetes. ?Eat a healthy diet and get regular exercise. If your blood sugar (glucose) cannot be managed through diet and exercise, you may need to take medicines. ?Take medicines as told by your doctor. ?Talk to your doctor about getting checked for sleeping problems. Signs of a problem can include: ?Snoring a lot. ?Feeling very tired. ?Make sure that you manage any other conditions you have. ?Nutrition ? ?  Follow instructions from your doctor about what to eat or drink. You may be told to: ?Eat and drink fewer calories each day. ?Limit how much salt (sodium) you use to 1,500 milligrams (mg) each day. ?Use only healthy fats for cooking, such as olive oil, canola oil, and sunflower oil. ?Eat healthy foods. To do this: ?Choose foods that are high  in fiber. These include whole grains, and fresh fruits and vegetables. ?Eat at least 5 servings of fruits and vegetables a day. Try to fill one-half of your plate with fruits and vegetables at each meal. ?Choose low-fat (lean) proteins. These include low-fat cuts of meat, chicken without skin, fish, tofu, beans, and nuts. ?Eat low-fat dairy products. ?Avoid foods that: ?Are high in salt. ?Have saturated fat. ?Have trans fat. ?Have cholesterol. ?Are processed or pre-made. ?Count how many carbohydrates you eat and drink each day. ?Lifestyle ?If you drink alcohol: ?Limit how much you have to: ?0-1 drink a day for women who are not pregnant. ?0-2 drinks a day for men. ?Know how much alcohol is in your drink. In the [place] one drink equals one 12 oz bottle of beer (362mL), one 5 oz glass of wine (144mL), or one 1? oz glass of hard liquor (58mL). ?Do not smoke or use any products that have nicotine or tobacco. If you need help quitting, ask your doctor. ?Avoid secondhand smoke. ?Do not use drugs. ?Activity ? ?Try to stay at a healthy weight. ?Get at least 30 minutes of exercise on most days, such as: ?Fast walking. ?Biking. ?Swimming. ?Medicines ?Take over-the-counter and prescription medicines only as told by your doctor. ?Avoid taking birth control pills. Talk to your doctor about the risks of taking birth control pills if: ?You are over 16 years old. ?You smoke. ?You get very bad headaches. ?You have had a blood clot. ?Where to find more information ?American Stroke Association: www.strokeassociation.org ?Get help right away if: ?You or a loved one has any signs of a stroke. "BE FAST" is an easy way to remember the warning signs: ?B - Balance. Dizziness, sudden trouble walking, or loss of balance. ?E - Eyes. Trouble seeing or a change in how you see. ?F - Face. Sudden weakness or loss of feeling of the face. The face or eyelid may droop on one side. ?A - Arms. Weakness or loss of feeling in an arm. This happens all of a  sudden and most often on one side of the body. ?S - Speech. Sudden trouble speaking, slurred speech, or trouble understanding what people say. ?T - Time. Time to call emergency services. Write down what time symptoms started. ?You or a loved one has other signs of a stroke, such as: ?A sudden, very bad headache with no known cause. ?Feeling like you may vomit (nausea). ?Vomiting. ?A seizure. ?These symptoms may be an emergency. Get help right away. Call your local emergency services (911 in the [place] ?Do not wait to see if the symptoms will go away. ?Do not drive yourself to the hospital. ?Summary ?You can help to prevent a stroke by eating healthy, exercising, and not smoking. It also helps to manage any health problems you have. ?Do not smoke or use any products that contain nicotine or tobacco. ?Get help right away if you or a loved one has any signs of a stroke. ?This information is not intended to replace advice given to you by your health care provider. Make sure you discuss any questions you have with your health care provider. ?  Document Revised: 01/22/2020 Document Reviewed: 01/22/2020 ?Elsevier Patient Education ? [place]. ? ?

## 2021-09-16 NOTE — Telephone Encounter (Signed)
Sent a message to Brittany with [facility] to see if she would be able to take the patient.  °

## 2021-09-17 NOTE — Telephone Encounter (Signed)
[place] me and informed me she can take the patient.  ?

## 2021-09-25 NOTE — Progress Notes (Signed)
I agree with the above plan 

## 2021-10-09 ENCOUNTER — Other Ambulatory Visit: Payer: Self-pay

## 2021-10-09 ENCOUNTER — Emergency Department
Admission: EM | Admit: 2021-10-09 | Discharge: 2021-10-09 | Disposition: A | Discharge status: Home / Self Care | Payer: Medicare HMO | Attending: Emergency Medicine | Admitting: Emergency Medicine

## 2021-10-09 DIAGNOSIS — R112 Nausea with vomiting, unspecified: Secondary | ICD-10-CM | POA: Diagnosis present

## 2021-10-09 DIAGNOSIS — R109 Unspecified abdominal pain: Secondary | ICD-10-CM | POA: Diagnosis not present

## 2021-10-09 LAB — COMPREHENSIVE METABOLIC PANEL
ALT: 23 U/L (ref 0–44)
AST: 28 U/L (ref 15–41)
Albumin: 3.8 g/dL (ref 3.5–5.0)
Alkaline Phosphatase: 54 U/L (ref 38–126)
Anion gap: 10 (ref 5–15)
BUN: 29 mg/dL — ABNORMAL HIGH (ref 8–23)
CO2: 26 mmol/L (ref 22–32)
Calcium: 8.7 mg/dL — ABNORMAL LOW (ref 8.9–10.3)
Chloride: 103 mmol/L (ref 98–111)
Creatinine, Ser: 1.09 mg/dL — ABNORMAL HIGH (ref 0.44–1.00)
GFR, Estimated: 48 mL/min — ABNORMAL LOW (ref >60)
Glucose, Bld: 119 mg/dL — ABNORMAL HIGH (ref 70–99)
Potassium: 3.6 mmol/L (ref 3.5–5.1)
Sodium: 139 mmol/L (ref 135–145)
Total Bilirubin: 0.7 mg/dL (ref 0.3–1.2)
Total Protein: 6.7 g/dL (ref 6.5–8.1)

## 2021-10-09 LAB — CBC WITH DIFFERENTIAL/PLATELET
Abs Immature Granulocytes: 0.02 10*3/uL (ref 0.00–0.07)
Basophils Absolute: 0 10*3/uL (ref 0.0–0.1)
Basophils Relative: 1 %
Eosinophils Absolute: 0 10*3/uL (ref 0.0–0.5)
Eosinophils Relative: 0 %
HCT: 35.6 % — ABNORMAL LOW (ref 36.0–46.0)
Hemoglobin: 11.2 g/dL — ABNORMAL LOW (ref 12.0–15.0)
Immature Granulocytes: 0 %
Lymphocytes Relative: 15 %
Lymphs Abs: 0.9 10*3/uL (ref 0.7–4.0)
MCH: 28.8 pg (ref 26.0–34.0)
MCHC: 31.5 g/dL (ref 30.0–36.0)
MCV: 91.5 fL (ref 80.0–100.0)
Monocytes Absolute: 0.3 10*3/uL (ref 0.1–1.0)
Monocytes Relative: 5 %
Neutro Abs: 4.7 10*3/uL (ref 1.7–7.7)
Neutrophils Relative %: 79 %
Platelets: 243 10*3/uL (ref 150–400)
RBC: 3.89 MIL/uL (ref 3.87–5.11)
RDW: 13.3 % (ref 11.5–15.5)
WBC: 5.9 10*3/uL (ref 4.0–10.5)
nRBC: 0 % (ref 0.0–0.2)

## 2021-10-09 LAB — TROPONIN I (HIGH SENSITIVITY): Troponin I (High Sensitivity): 17 ng/L (ref <18)

## 2021-10-09 LAB — URINALYSIS, ROUTINE W REFLEX MICROSCOPIC
Bilirubin Urine: NEGATIVE
Glucose, UA: NEGATIVE mg/dL
Hgb urine dipstick: NEGATIVE
Ketones, ur: 5 mg/dL — AB
Leukocytes,Ua: NEGATIVE
Nitrite: NEGATIVE
Protein, ur: NEGATIVE mg/dL
Specific Gravity, Urine: 1.015 (ref 1.005–1.030)
pH: 6 (ref 5.0–8.0)

## 2021-10-09 NOTE — ED Provider Notes (Signed)
? ?[facility] ?Provider Note ? ? ? Event Date/Time  ? First MD Initiated Contact with Patient 10/09/21 1855   ?  (approximate) ? ? ?History  ? ?Nausea ? ? ?HPI ? ?Shawna Hopkins is a 88 y.o. female with a history of atrial flutter, and CVA who presents with nausea and vomiting which have now resolved.  The son states that the patient ate breakfast normally, then early in the afternoon she began having nausea and vomiting and complaining of abdominal pain.  Subsequently the vomiting stopped with the patient stated that she felt weak and appeared unwell.  The patient denies any acute complaints at this time.  She denies any nausea, abdominal pain, or other acute symptoms.  She has not had any diarrhea. ? ? ? ?Physical Exam  ? ?Triage Vital Signs: ?ED Triage Vitals  ?Enc Vitals Group  ?   BP 10/09/21 1848 (!) 190/94  ?   Pulse Rate 10/09/21 1848 87  ?   Resp 10/09/21 1848 15  ?   Temp 10/09/21 1848 98.1 ?F (36.7 ?C)  ?   Temp Source 10/09/21 1848 Oral  ?   SpO2 10/09/21 1848 96 %  ?   Weight --   ?   Height --   ?   Head Circumference --   ?   Peak Flow --   ?   Pain Score 10/09/21 1849 0  ?   Pain Loc --   ?   Pain Edu? --   ?   Excl. in [place]? --   ? ? ?Most recent vital signs: ?Vitals:  ? 10/09/21 2230 10/09/21 2300  ?BP: (!) 153/85   ?Pulse: 88 79  ?Resp: 16   ?Temp:    ?SpO2: 100% 96%  ? ? ? ?General: Alert, mildly confused, but well appearing for age, no distress. ?CV:  Good peripheral perfusion.  ?Resp:  Normal effort.  Lungs CTAB. ?Abd:  Soft and nontender. ?Other:  Moist mucous membranes.  No scleral icterus.  1+ edema to bilateral lower extremities. ? ? ?ED Results / Procedures / Treatments  ? ?Labs ?(all labs ordered are listed, but only abnormal results are displayed) ?Labs Reviewed  ?COMPREHENSIVE METABOLIC PANEL - Abnormal; Notable for the following components:  ?    Result Value  ? Glucose, Bld 119 (*)   ? BUN 29 (*)   ? Creatinine, Ser 1.09 (*)   ? Calcium 8.7 (*)   ? GFR,  Estimated 48 (*)   ? All other components within normal limits  ?CBC WITH DIFFERENTIAL/PLATELET - Abnormal; Notable for the following components:  ? Hemoglobin 11.2 (*)   ? HCT 35.6 (*)   ? All other components within normal limits  ?URINALYSIS, ROUTINE W REFLEX MICROSCOPIC - Abnormal; Notable for the following components:  ? Color, Urine YELLOW (*)   ? APPearance CLEAR (*)   ? Ketones, ur 5 (*)   ? All other components within normal limits  ?TROPONIN I (HIGH SENSITIVITY)  ? ? ? ?EKG ? ?ED ECG REPORT ?IDionne Bucy, the attending physician, personally viewed and interpreted this ECG. ? ?Date: 10/09/2021 ?EKG Time: 1847 ?Rate: 80 ?Rhythm: Atrial fibrillation ?QRS Axis: Right axis ?Intervals: normal ?ST/T Wave abnormalities: normal ?Narrative Interpretation: no evidence of acute ischemia ? ? ? ?RADIOLOGY ? ? ? ?PROCEDURES: ? ?Critical Care performed: No ? ?Procedures ? ? ?MEDICATIONS ORDERED IN ED: ?Medications - No data to display ? ? ?IMPRESSION / MDM / ASSESSMENT AND PLAN /  ED COURSE  ?I reviewed the triage vital signs and the nursing notes. ? ?88 year old female with PMH as noted above presents with nausea, vomiting, generalized weakness earlier today which have now resolved.  The patient is without acute complaints at this time. ? ?I reviewed the past medical records; the patient was most recently admitted in January of this year after presenting with right-sided weakness and global aphasia.  The patient was found to have multiple acute left MCA infarcts and was found to be in atrial flutter. ? ?On exam the patient is overall well-appearing.  She is hypertensive with otherwise normal vital signs.  The abdomen is soft and nontender. ? ?Differential diagnosis includes, but is not limited to, gastroenteritis, foodborne illness, gastritis, gastroparesis, UTI or other infection, or less likely cardiac cause. ? ?We will obtain basic and cardiac labs and reassess.  There is no indication for imaging at this  time. ? ?The patient is on the cardiac monitor to evaluate for evidence of arrhythmia and/or significant heart rate changes. ? ?----------------------------------------- ?11:12 PM on 10/09/2021 ?----------------------------------------- ? ?Lab work-up is reassuring.  Troponin is minimally elevated but consistent with prior troponin levels.  Given the timing of the symptoms, there is no indication for a repeat. ? ?BMP is within normal limits for the patient.  There is no elevated WBC count.  Urinalysis is negative. ? ?The patient has remained stable for her entire 4-hour ED stay.  She has had no recurrence of symptoms.  At this time, she is stable for discharge home.  Return precautions given, and the son demonstrates understanding. ? ? ?FINAL CLINICAL IMPRESSION(S) / ED DIAGNOSES  ? ?Final diagnoses:  ?Nausea and vomiting, unspecified vomiting type  ? ? ? ?Rx / DC Orders  ? ?ED Discharge Orders   ? ? None  ? ?  ? ? ? ?Note:  This document was prepared using Dragon voice recognition software and may include unintentional dictation errors.  ?  Dionne Bucy, MD ?10/09/21 2313 ? ?

## 2021-10-09 NOTE — Discharge Instructions (Signed)
Return to the ER for new, worsening, or persistent severe nausea or vomiting, abdominal pain, weakness, fever, or any other new or worsening symptoms that concern you. ?

## 2021-10-09 NOTE — ED Triage Notes (Signed)
Pt presents to ED via EMS with c/o of family wanting pt to be checked out due to 1 episode of vomiting and nausea. ? ?EMS states pt has HX of dementia and states pt took off for about 4 hours yesterday and EMS states pt was found about 1/2 mile down the road. PD found pt safely with no harm or injury. Pt denies N/V and any complaints to this RN at this time.  ?

## 2022-03-03 ENCOUNTER — Telehealth (HOSPITAL_COMMUNITY): Payer: Self-pay

## 2022-03-03 NOTE — Telephone Encounter (Signed)
Called to schedule f/u mra. Spoke to pt's grandson, he will relay the message to the family and they will call back if they would like to schedule. AW

## 2022-05-03 ENCOUNTER — Encounter: Payer: Self-pay | Admitting: Emergency Medicine

## 2022-05-03 ENCOUNTER — Emergency Department
Admission: EM | Admit: 2022-05-03 | Discharge: 2022-05-03 | Disposition: A | Discharge status: Home / Self Care | Payer: Medicare HMO | Attending: Student in an Organized Health Care Education/Training Program | Admitting: Student in an Organized Health Care Education/Training Program

## 2022-05-03 ENCOUNTER — Other Ambulatory Visit: Payer: Self-pay

## 2022-05-03 ENCOUNTER — Emergency Department: Payer: Medicare HMO

## 2022-05-03 DIAGNOSIS — R0789 Other chest pain: Secondary | ICD-10-CM | POA: Diagnosis present

## 2022-05-03 DIAGNOSIS — F0390 Unspecified dementia without behavioral disturbance: Secondary | ICD-10-CM | POA: Insufficient documentation

## 2022-05-03 DIAGNOSIS — Z7901 Long term (current) use of anticoagulants: Secondary | ICD-10-CM | POA: Insufficient documentation

## 2022-05-03 DIAGNOSIS — R079 Chest pain, unspecified: Secondary | ICD-10-CM

## 2022-05-03 DIAGNOSIS — R531 Weakness: Secondary | ICD-10-CM | POA: Diagnosis not present

## 2022-05-03 DIAGNOSIS — R410 Disorientation, unspecified: Secondary | ICD-10-CM | POA: Diagnosis not present

## 2022-05-03 LAB — HEPATIC FUNCTION PANEL
ALT: 11 U/L (ref 0–44)
AST: 18 U/L (ref 15–41)
Albumin: 3.6 g/dL (ref 3.5–5.0)
Alkaline Phosphatase: 67 U/L (ref 38–126)
Bilirubin, Direct: 0.1 mg/dL (ref 0.0–0.2)
Indirect Bilirubin: 0.2 mg/dL — ABNORMAL LOW (ref 0.3–0.9)
Total Bilirubin: 0.3 mg/dL (ref 0.3–1.2)
Total Protein: 7.1 g/dL (ref 6.5–8.1)

## 2022-05-03 LAB — URINALYSIS, ROUTINE W REFLEX MICROSCOPIC
Bilirubin Urine: NEGATIVE
Glucose, UA: NEGATIVE mg/dL
Hgb urine dipstick: NEGATIVE
Ketones, ur: NEGATIVE mg/dL
Leukocytes,Ua: NEGATIVE
Nitrite: NEGATIVE
Protein, ur: 30 mg/dL — AB
Specific Gravity, Urine: 1.02 (ref 1.005–1.030)
pH: 5 (ref 5.0–8.0)

## 2022-05-03 LAB — BASIC METABOLIC PANEL
Anion gap: 8 (ref 5–15)
BUN: 31 mg/dL — ABNORMAL HIGH (ref 8–23)
CO2: 27 mmol/L (ref 22–32)
Calcium: 9 mg/dL (ref 8.9–10.3)
Chloride: 108 mmol/L (ref 98–111)
Creatinine, Ser: 0.99 mg/dL (ref 0.44–1.00)
GFR, Estimated: 53 mL/min — ABNORMAL LOW (ref >60)
Glucose, Bld: 116 mg/dL — ABNORMAL HIGH (ref 70–99)
Potassium: 3.6 mmol/L (ref 3.5–5.1)
Sodium: 143 mmol/L (ref 135–145)

## 2022-05-03 LAB — LIPASE, BLOOD: Lipase: 27 U/L (ref 11–51)

## 2022-05-03 LAB — CBC
HCT: 37.2 % (ref 36.0–46.0)
Hemoglobin: 11.7 g/dL — ABNORMAL LOW (ref 12.0–15.0)
MCH: 27.5 pg (ref 26.0–34.0)
MCHC: 31.5 g/dL (ref 30.0–36.0)
MCV: 87.5 fL (ref 80.0–100.0)
Platelets: 220 10*3/uL (ref 150–400)
RBC: 4.25 MIL/uL (ref 3.87–5.11)
RDW: 13.4 % (ref 11.5–15.5)
WBC: 7.1 10*3/uL (ref 4.0–10.5)
nRBC: 0 % (ref 0.0–0.2)

## 2022-05-03 LAB — TROPONIN I (HIGH SENSITIVITY)
Troponin I (High Sensitivity): 19 ng/L — ABNORMAL HIGH (ref <18)
Troponin I (High Sensitivity): 20 ng/L — ABNORMAL HIGH (ref <18)

## 2022-05-03 MED ORDER — CEPHALEXIN 500 MG PO CAPS
500.0000 mg | ORAL_CAPSULE | Freq: Once | ORAL | Status: AC
  Administered 2022-05-03: 500 mg via ORAL
  Filled 2022-05-03: qty 1

## 2022-05-03 MED ORDER — CEPHALEXIN 500 MG PO CAPS: 500.0000 mg | ORAL_CAPSULE | Freq: Two times a day (BID) | ORAL | 0 refills | Status: AC

## 2022-05-03 MED ORDER — QUETIAPINE FUMARATE 25 MG PO TABS
25.0000 mg | ORAL_TABLET | Freq: Every day | ORAL | Status: DC
  Administered 2022-05-03: 25 mg via ORAL
  Filled 2022-05-03: qty 1

## 2022-05-03 NOTE — ED Provider Notes (Signed)
[facility] Provider Note    Event Date/Time   First MD Initiated Contact with Patient 05/03/22 1651     (approximate)   History   Weakness  Level V caveat:  non verbal at baseline per family - cva/dementia  HPI  Shawna Hopkins is a 88 y.o. female   presents to the ER for evaluation of several days of confusion as well as an episode of reported chest discomfort earlier today with a home health aide said that she grabbed her chest was bent over.  Looked like she was having trouble breathing.  EMS checked her out and her vitals and everything was reassuring.  No report of any pain at this time.  She has no specific complaints according to grandson.  Questioning possible UTI.      Physical Exam   Triage Vital Signs: ED Triage Vitals [05/03/22 1601]  Enc Vitals Group     BP (!) 157/93     Pulse Rate 65     Resp 18     Temp 97.7 F (36.5 C)     Temp Source Oral     SpO2 97 %     Weight      Height      Head Circumference      Peak Flow      Pain Score 0     Pain Loc      Pain Edu?      Excl. in [place]?     Most recent vital signs: Vitals:   05/03/22 1815 05/03/22 2100  BP: (!) 150/88 (!) 152/82  Pulse: 84 73  Resp: (!) 26 17  Temp:    SpO2: 97% 96%     Constitutional: Alert  Eyes: Conjunctivae are normal.  Head: Atraumatic. Nose: No congestion/rhinnorhea. Mouth/Throat: Mucous membranes are moist.   Neck: Painless ROM.  Cardiovascular:   Good peripheral circulation. Respiratory: Normal respiratory effort.  No retractions.  Gastrointestinal: Soft and nontender.  Musculoskeletal:  no deformity Neurologic:  MAE spontaneously. No gross focal neurologic deficits are appreciated.  Skin:  Skin is warm, dry and intact. No rash noted. Psychiatric: Mood and affect are normal. Speech and behavior are normal.    ED Results / Procedures / Treatments   Labs (all labs ordered are listed, but only abnormal results are displayed) Labs  Reviewed  URINALYSIS, ROUTINE W REFLEX MICROSCOPIC - Abnormal; Notable for the following components:      Result Value   Color, Urine YELLOW (*)    APPearance HAZY (*)    Protein, ur 30 (*)    Bacteria, UA RARE (*)    All other components within normal limits  BASIC METABOLIC PANEL - Abnormal; Notable for the following components:   Glucose, Bld 116 (*)    BUN 31 (*)    GFR, Estimated 53 (*)    All other components within normal limits  CBC - Abnormal; Notable for the following components:   Hemoglobin 11.7 (*)    All other components within normal limits  HEPATIC FUNCTION PANEL - Abnormal; Notable for the following components:   Indirect Bilirubin 0.2 (*)    All other components within normal limits  TROPONIN I (HIGH SENSITIVITY) - Abnormal; Notable for the following components:   Troponin I (High Sensitivity) 19 (*)    All other components within normal limits  TROPONIN I (HIGH SENSITIVITY) - Abnormal; Notable for the following components:   Troponin I (High Sensitivity) 20 (*)    All  other components within normal limits  URINE CULTURE  LIPASE, BLOOD  CBG MONITORING, ED     EKG  ED ECG REPORT I, Willy Eddy, the attending physician, personally viewed and interpreted this ECG.   Date: 05/03/2022  EKG Time: 21:12  Rate: 80  Rhythm: afib  Axis: normal  Intervals: normal  ST&T Change: nonspecific st abn, no stemi    RADIOLOGY Please see ED Course for my review and interpretation.  I personally reviewed all radiographic images ordered to evaluate for the above acute complaints and reviewed radiology reports and findings.  These findings were personally discussed with the patient.  Please see medical record for radiology report.    PROCEDURES:  Critical Care performed: No  Procedures   MEDICATIONS ORDERED IN ED: Medications  QUEtiapine (SEROQUEL) tablet 25 mg (has no administration in time range)  cephALEXin (KEFLEX) capsule 500 mg (has no  administration in time range)     IMPRESSION / MDM / ASSESSMENT AND PLAN / ED COURSE  I reviewed the triage vital signs and the nursing notes.                              Differential diagnosis includes, but is not limited to, Dehydration, sepsis, pna, uti, hypoglycemia, cva, drug effect, withdrawal, encephalitis  Patient presenting to the ER for evaluation of symptoms as described above.  Based on symptoms, risk factors and considered above differential, this presenting complaint could reflect a potentially life-threatening illness therefore the patient will be placed on continuous pulse oximetry and telemetry for monitoring.  Laboratory evaluation will be sent to evaluate for the above complaints.  Imaging will be ordered for the above differential.  Remote history of CVA as well as A-fib on Eliquis also with worsening dementia.  Will check blood work.  She denies any complaints at this time.   Clinical Course as of 05/03/22 2122  Wynelle Link May 03, 2022  1720 Chest x-ray my review and interpretation does not show any evidence of consolidation or infiltrate.  Will await formal radiology report. [PR]  2106 Reassessed.  She is asking to be discharged home.  Denies any discomfort.  Her work-up here is grossly reassuring.  Does have some rare bacteria in her urine family states that she had similar presentation in the past that got better when she was treated for UTI.  Will send urine culture.  Think is reasonable to try a course of antibiotics for her.  We discussed return precautions.  Patient family agreeable to plan. [PR]    Clinical Course User Index [PR] Willy Eddy, MD     FINAL CLINICAL IMPRESSION(S) / ED DIAGNOSES   Final diagnoses:  Weakness  Chest pain, unspecified type     Rx / DC Orders   ED Discharge Orders          Ordered    cephALEXin (KEFLEX) 500 MG capsule  2 times daily        05/03/22 2105             Note:  This document was prepared using Dragon  voice recognition software and may include unintentional dictation errors.    Willy Eddy, MD 05/03/22 2122

## 2022-05-03 NOTE — ED Triage Notes (Signed)
Pt in via [facility] from home with c/o weakness for last few days. Pt with dementia. Pt grabbed chest this am but denied pain this am with family. 154/81, HR 83, 98% RA, CBG 113

## 2022-05-03 NOTE — ED Triage Notes (Signed)
Pt from home via EMS with complaint of weakness x a few days.  Pt has dementia and expresses no complaints of pain at time of triage.  Awaiting family arrival.  Pt reportedly grabbed her chest this morning but denied pain to her family.

## 2022-05-03 NOTE — ED Notes (Signed)
Unable to obtain labs and EKG on patient in triage  Pt is unable to cooperate well d/t dementia

## 2022-05-05 LAB — URINE CULTURE

## 2023-01-04 DEATH — deceased

## 2023-04-04 IMAGING — MR MR HEAD W/O CM
13 series · 48 of 48 positions shown · non-contrast
Comparison: Noncontrast head CT and CT angiogram head/neck
06/28/2021.

CLINICAL DATA: Neuro deficit, acute, stroke suspected.

EXAM:
MRI HEAD WITHOUT CONTRAST
TECHNIQUE: Multiplanar, multiecho pulse sequences of the brain and surrounding
structures were obtained without intravenous contrast.

[Series 5: ax dwi_tracew · axial · 3.0mm · 0.71mm/px · z∈[-78,+86]mm · 3 of 56 slices shown]
[im 1/56]
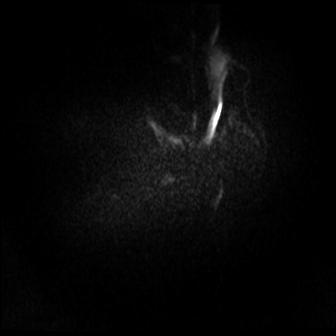
[im 28/56]
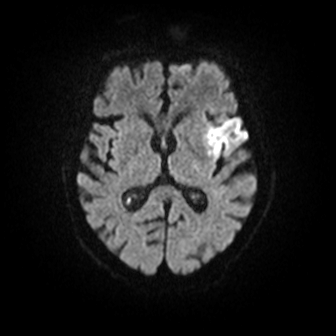
[im 56/56]
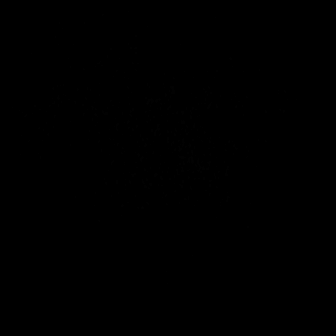

[Series 6: ax dwi_adc · axial · 3.0mm · 0.71mm/px · z∈[-78,+77]mm · 3 of 53 slices shown]
[im 1/53]
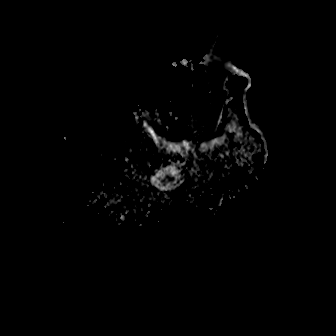
[im 27/53]
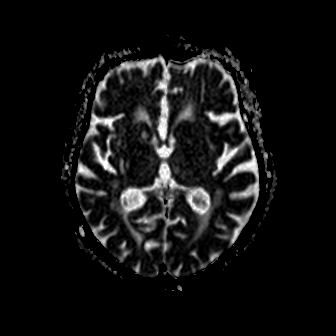
[im 53/53]
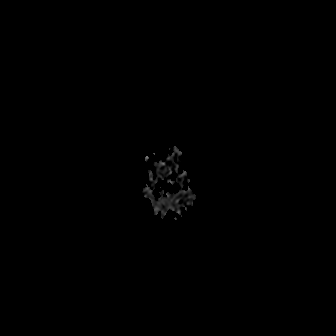

[Series 7: cor dwi_tracew · coronal · 5.0mm · 0.68mm/px · 2 of 40 slices shown]
[im 1/40]
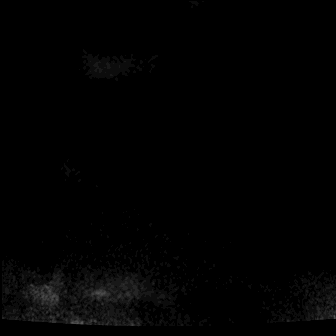
[im 40/40]
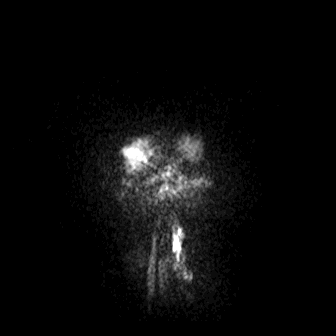

[Series 8: cor dwi_adc · coronal · 5.0mm · 0.68mm/px · 3 of 38 slices shown]
[im 1/38]
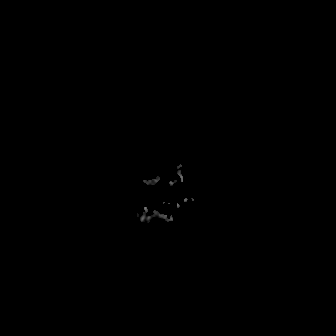
[im 19/38]
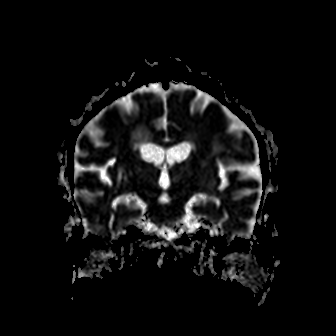
[im 38/38]
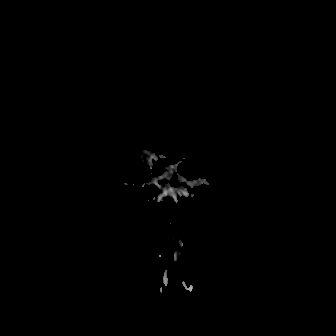

[Series 9: T1 · sagittal · 5.0mm · 0.47mm/px · 2 of 24 slices shown (1 of 2)]
[im 1/24]
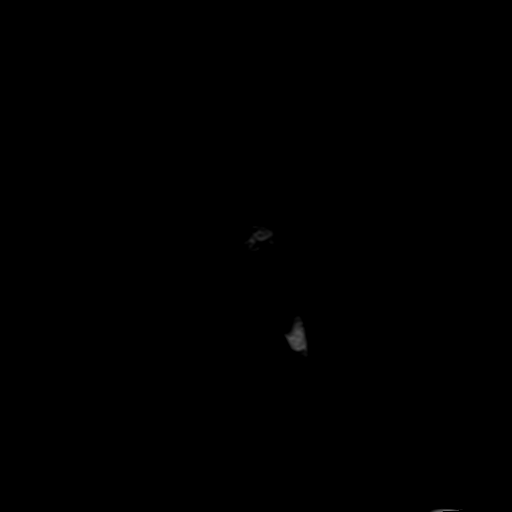
[im 24/24]
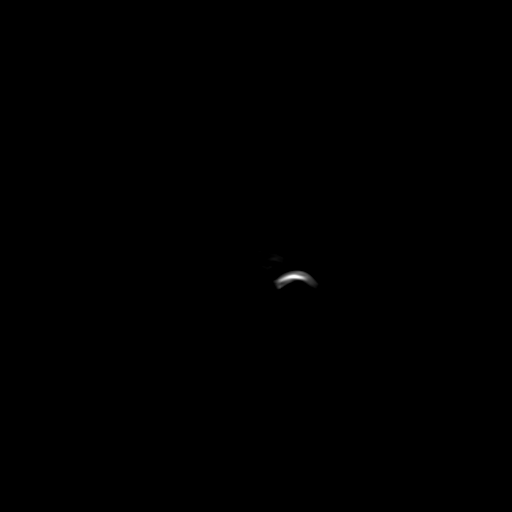

[Series 10: T2 · axial · 5.0mm · 0.86mm/px · z∈[-69,+75]mm · 2 of 25 slices shown (1 of 2)]
[im 1/25]
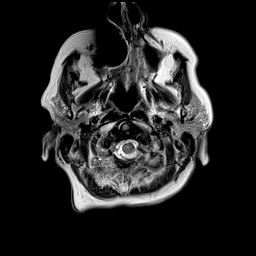
[im 25/25]
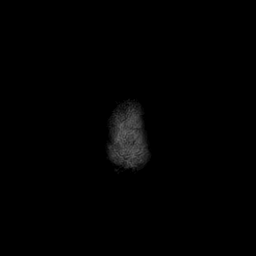

[Series 11: mag_images · axial · 3.0mm · 0.90mm/px · z∈[-73,+80]mm · 4 of 52 slices shown]
[im 1/52]
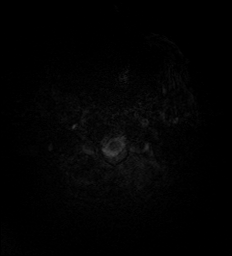
[im 18/52]
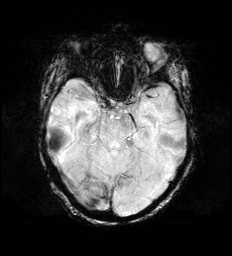
[im 35/52]
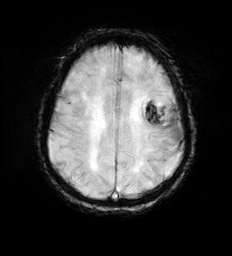
[im 52/52]
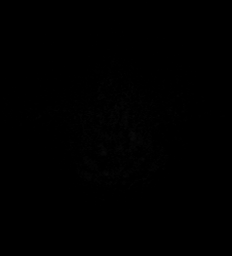

[Series 12: pha_images · axial · 3.0mm · 0.90mm/px · z∈[-73,+80]mm · 4 of 52 slices shown]
[im 1/52]
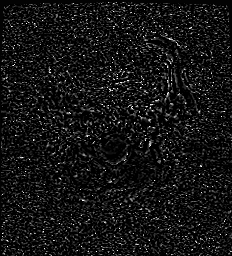
[im 18/52]
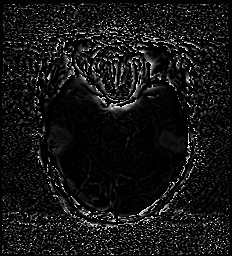
[im 35/52]
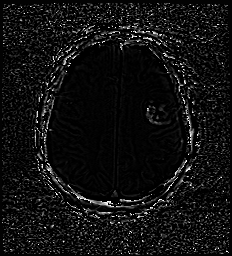
[im 52/52]
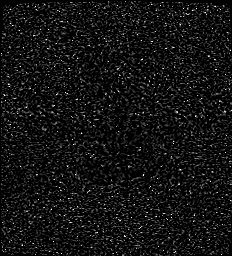

[Series 13: swi_images · axial · 3.0mm · 0.90mm/px · z∈[-73,+80]mm · 4 of 52 slices shown]
[im 1/52]
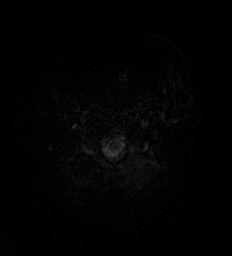
[im 18/52]
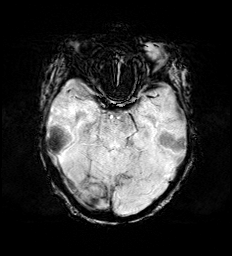
[im 35/52]
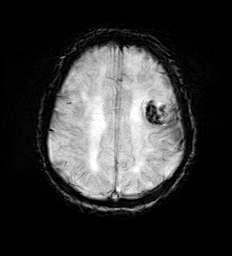
[im 52/52]
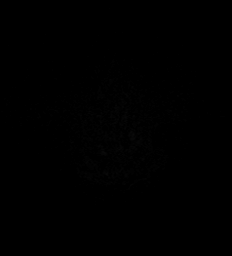

[Series 14: mip_images(sw) · axial · 24.0mm · 0.90mm/px · z∈[-62,+69]mm · 3 of 45 slices shown]
[im 1/45]
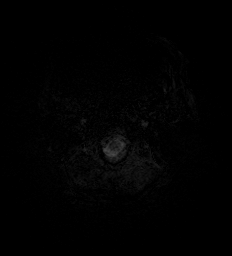
[im 23/45]
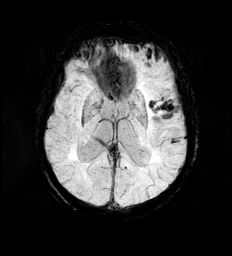
[im 45/45]
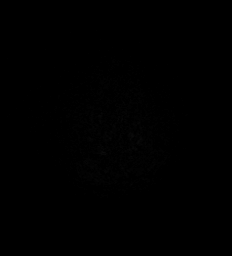

[Series 15: FLAIR · axial · 3.0mm · 0.69mm/px · z∈[-78,+84]mm · 4 of 55 slices shown]
[im 1/55]
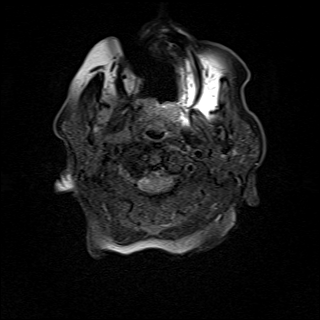
[im 19/55]
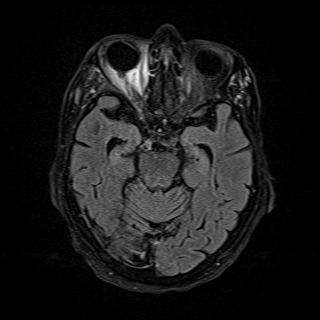
[im 37/55]
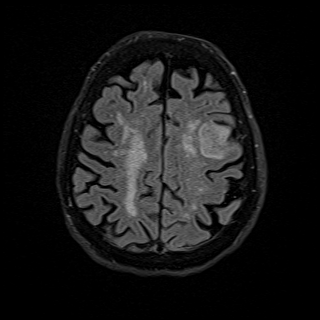
[im 55/55]
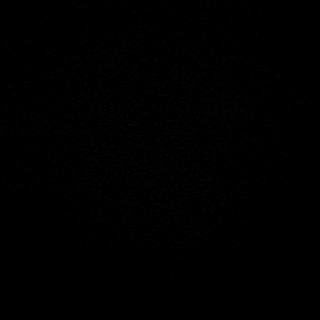

[Series 16: T1 · axial · 1.0mm · 0.98mm/px · z∈[-83,+92]mm · 12 of 176 slices shown (2 of 2)]
[im 1/176]
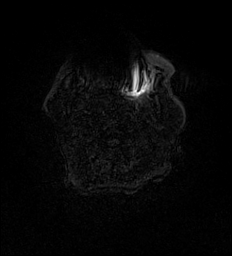
[im 16/176]
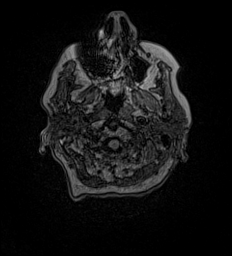
[im 32/176]
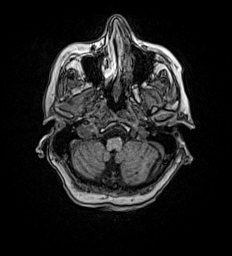
[im 48/176]
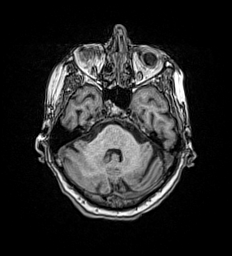
[im 64/176]
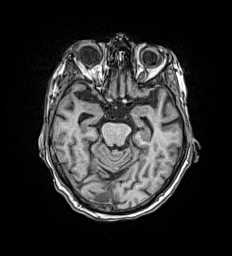
[im 80/176]
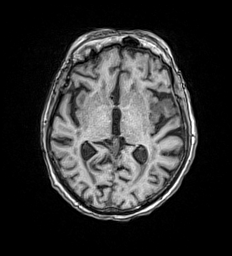
[im 96/176]
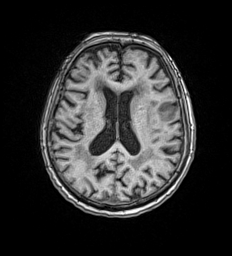
[im 112/176]
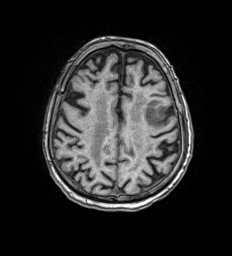
[im 128/176]
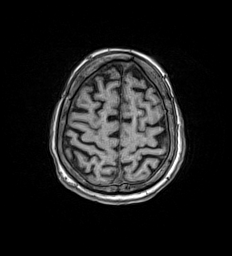
[im 144/176]
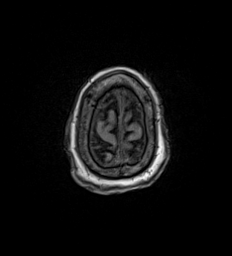
[im 160/176]
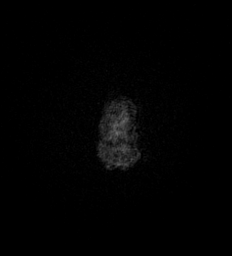
[im 176/176]
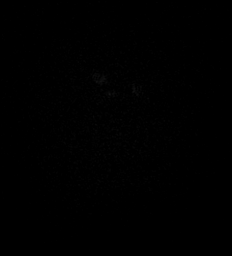

[Series 17: T2 · coronal · 5.0mm · 0.86mm/px · 2 of 30 slices shown (2 of 2)]
[im 1/30]
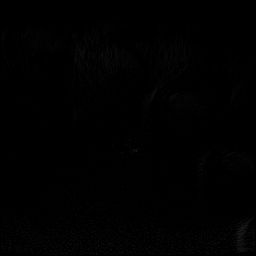
[im 30/30]
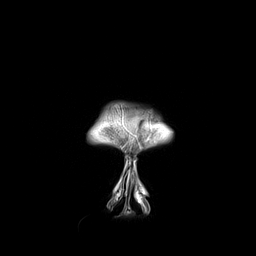

[48 of 48 positions shown; findings below may reference images not displayed]

FINDINGS: Brain:

Mild generalized cerebral and cerebellar atrophy.

Multiple acute cortical/subcortical infarcts within the
mid-to-posterior left frontal lobe, left frontal operculum and left
insula. The largest infarct measures 2.8 x 2.6 cm x 4.4 cm. There is
gyriform SWI signal loss associated with this largest infarct
compatible with petechial hemorrhage/hemorrhagic conversion. There
is only mild local mass effect. A few small acute cortically-based
infarcts are also present within the left parietal and temporal
lobes.

Background advanced patchy and confluent T2 FLAIR hyperintense
signal abnormality within the cerebral white matter, nonspecific but
compatible with chronic small vessel ischemic disease. Minimal
chronic small-vessel ischemic changes are also present within the
pons.

Chronic infarcts within the bilateral basal ganglia.

Punctate chronic microhemorrhages within the medial left
parietooccipital lobes and left thalamus.

Small chronic infarcts within the bilateral cerebellar hemispheres.

No evidence of an intracranial mass.

No extra-axial fluid collection.

No midline shift.

Vascular: Curvilinear SWI signal loss at the posterior aspect of the
left sylvian fissure, likely reflecting endoluminal thrombus within
a distal M2/proximal M3 left MCA vessel, as described on the CTA
performed earlier today. Flow voids otherwise preserved within the
proximal large arterial vessels.

Skull and upper cervical spine: No focal suspicious marrow lesion.
Incompletely assessed cervical spondylosis.

Sinuses/Orbits: Visualized orbits show no acute finding. Right lens
replacement. Mild mucosal thickening within the bilateral ethmoid
sinuses.

Impression #1 was called by telephone at the time of interpretation
on 06/28/2021 at [DATE] to provider Dr. Yomi, who verbally
acknowledged these results.
IMPRESSION: Multiple acute cortical/subcortical left MCA territory infarcts
within the left frontal, parietal and temporal lobes. The largest
infarct within the mid-to-posterior left frontal lobe, left frontal
operculum and left insula measures 2.8 x 2.6 x 4.4 cm. SWI signal
loss is present at site of this dominant infarct, compatible with
petechial hemorrhage/hemorrhagic conversion. There is only mild
local mass effect.

Background advanced chronic small vessel changes within the cerebral
white matter. Mild chronic small vessel ischemic changes are also
present within the pons.

Chronic lacunar infarcts within the bilateral basal ganglia.

Small chronic infarcts within the bilateral cerebellar hemispheres.

Mild generalized parenchymal atrophy.

## 2023-04-05 IMAGING — CT CT HEAD W/O CM
4 series · 17 of 47 positions shown, 19 images · non-contrast
Comparison: 06/28/2021

CLINICAL DATA: Stroke follow-up

EXAM:
CT HEAD WITHOUT CONTRAST
TECHNIQUE: Contiguous axial images were obtained from the base of the skull
through the vertex without intravenous contrast.

[Series 3: head wo · axial · 0.40mm/px · z∈[+1078,+1203]mm · 7 of 35 slices shown, 9 images]
[im 5/35  brain]
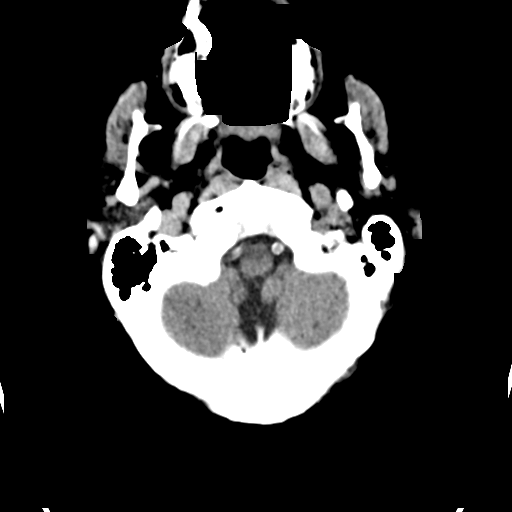
[im 5/35  bone]
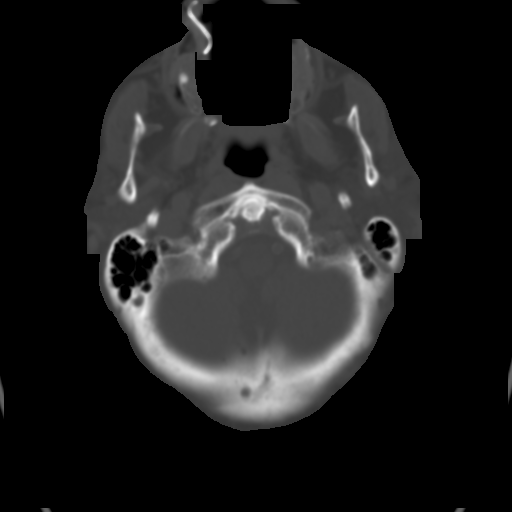
[im 9/35  brain]
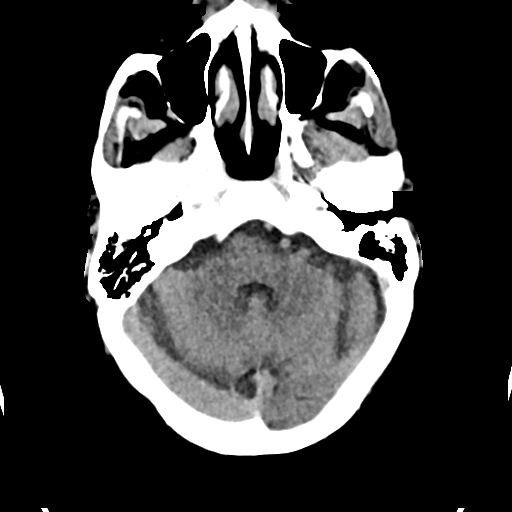
[im 13/35  brain]
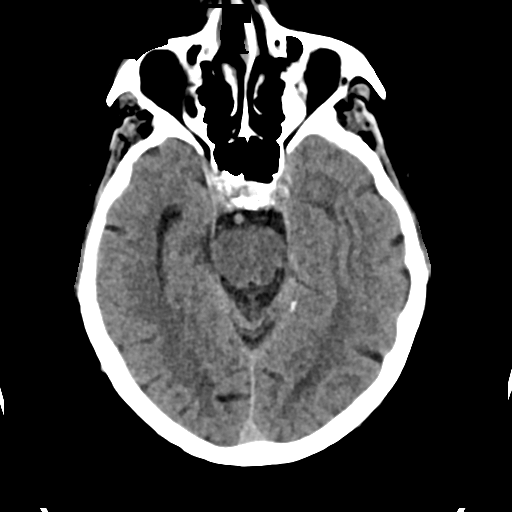
[im 18/35  brain]
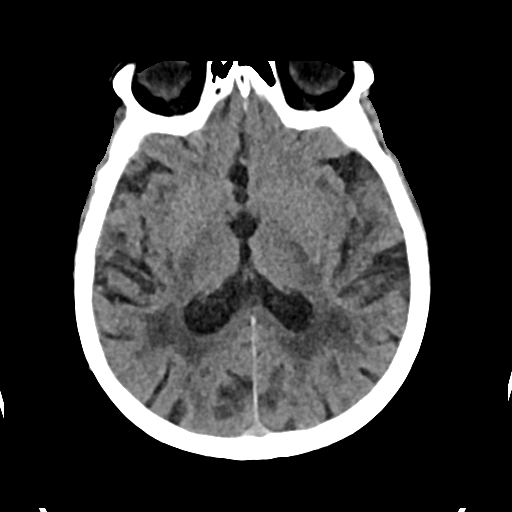
[im 22/35  brain]
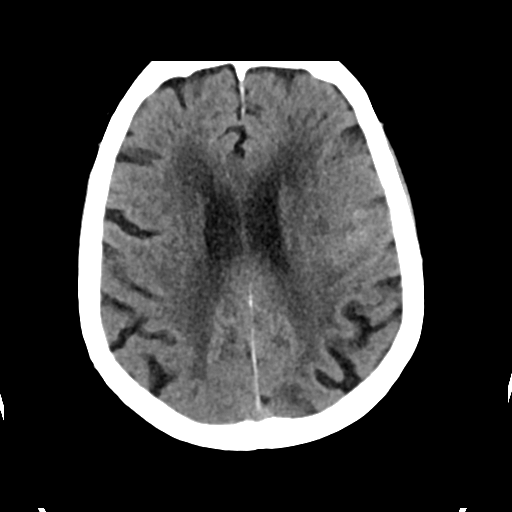
[im 22/35  bone]
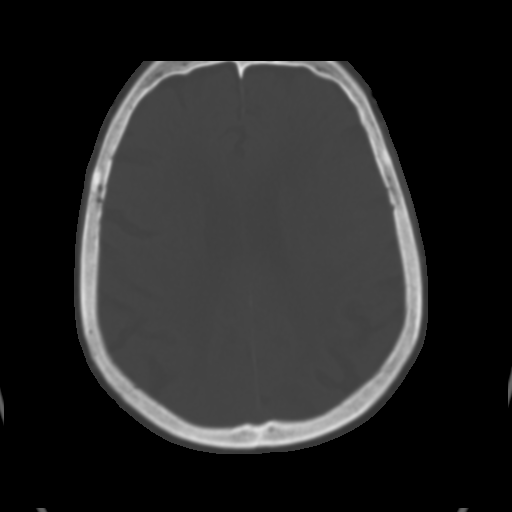
[im 26/35  brain]
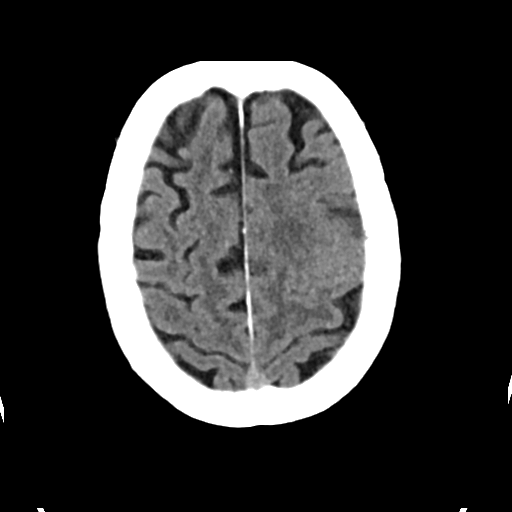
[im 30/35  brain]
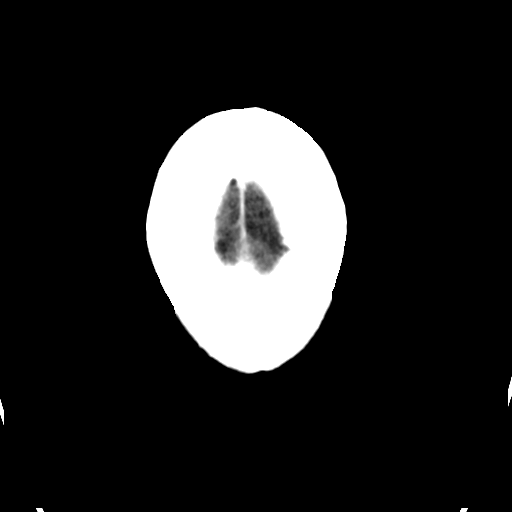

[Series 4: head bone · axial · 0.40mm/px · z∈[+1074,+1134]mm · 4 of 86 slices shown]
[im 9/86  bone]
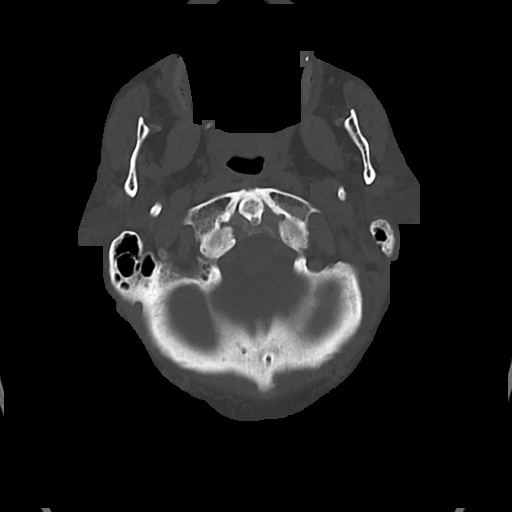
[im 18/86  bone]
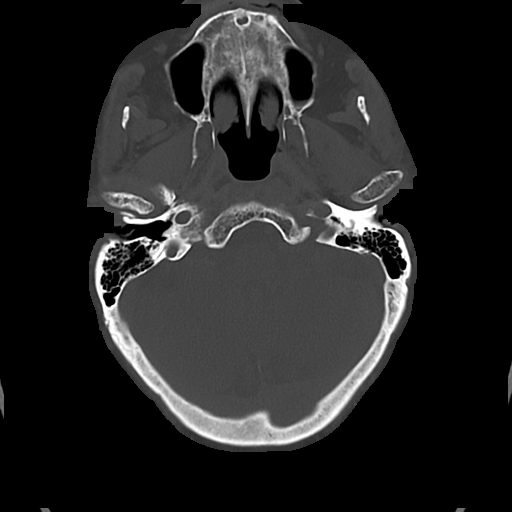
[im 26/86  bone]
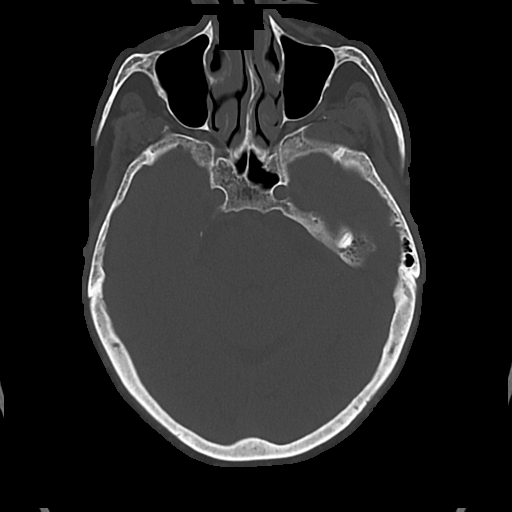
[im 39/86  bone]
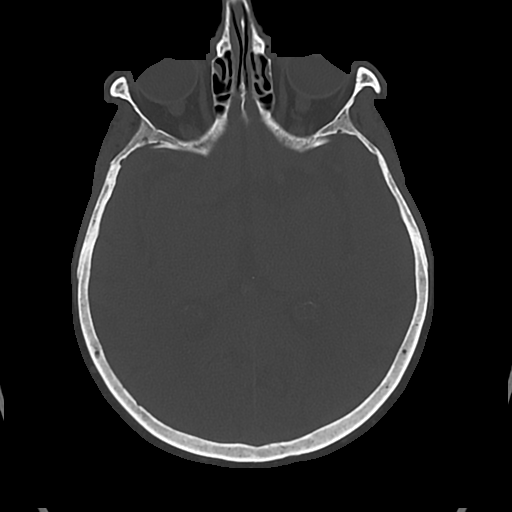

[Series 5: cor soft · coronal · 0.31mm/px · 3 of 67 slices shown]
[im 23/67  brain]
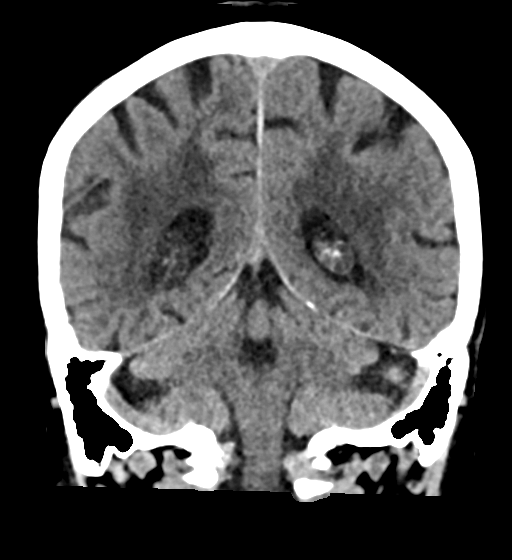
[im 30/67  brain]
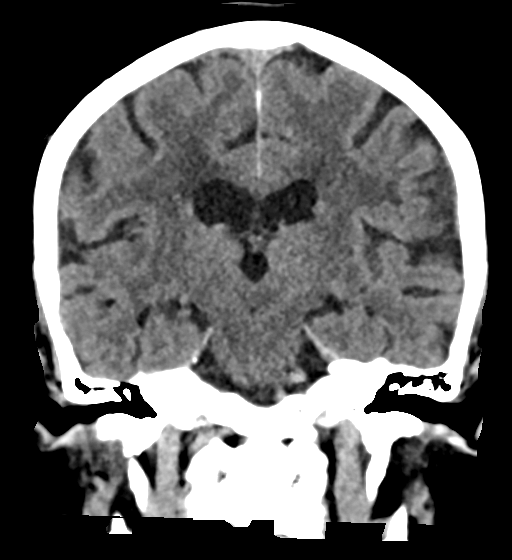
[im 37/67  brain]
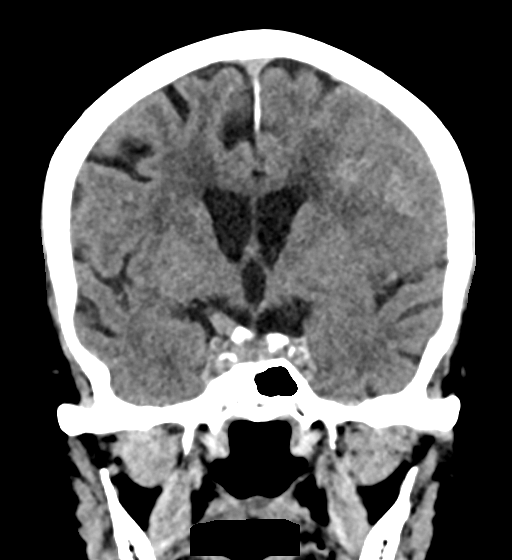

[Series 6: sag soft · sagittal · 0.34mm/px · 3 of 54 slices shown]
[im 18/54  brain]
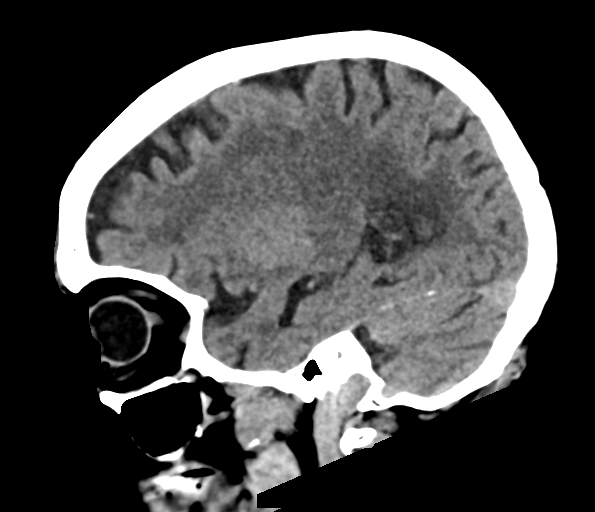
[im 27/54  brain]
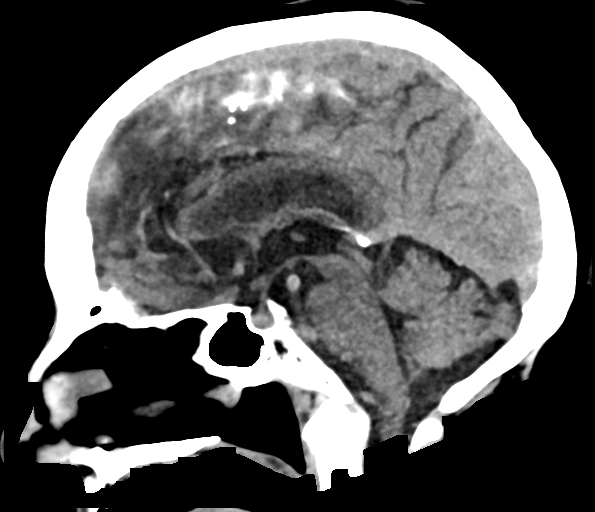
[im 36/54  brain]
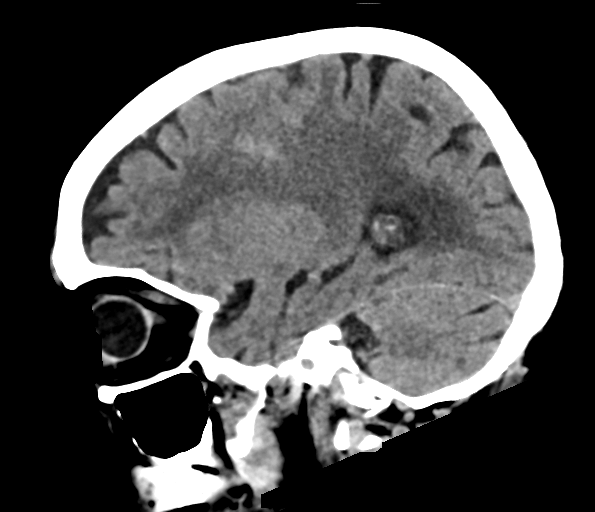

[17 of 47 positions shown; findings below may reference images not displayed]

FINDINGS: Brain: There is small volume petechial hemorrhage of the left
frontal lobe. No mass effect. There is generalized atrophy without
lobar predilection. Hypodensity of the white matter is most commonly
associated with chronic microvascular disease.

Vascular: No abnormal hyperdensity of the major intracranial
arteries or dural venous sinuses. No intracranial atherosclerosis.

Skull: The visualized skull base, calvarium and extracranial soft
tissues are normal.

Sinuses/Orbits: No fluid levels or advanced mucosal thickening of
the visualized paranasal sinuses. No mastoid or middle ear effusion.
The orbits are normal.
IMPRESSION: 1. Small volume petechial hemorrhage of the left frontal lobe
without mass effect.
2. Hypodensity of the white matter is most commonly associated with
chronic microvascular disease.

## 2023-04-05 IMAGING — CT CT HEAD W/O CM
4 series · 16 of 47 positions shown, 18 images · non-contrast
Comparison: Brain MRI, head CT 8964 hours today. CTA head and neck
06/28/2021.

CLINICAL DATA: [AGE] female with left MCA M2/proximal M3
occlusion and MCA territory infarct. Left ICA 6 mm aneurysm.

EXAM:
CT HEAD WITHOUT CONTRAST
TECHNIQUE: Contiguous axial images were obtained from the base of the skull
through the vertex without intravenous contrast.

[Series 2: head wo · axial · 0.39mm/px · z∈[-128,-24]mm · 7 of 29 slices shown, 9 images]
[im 4/29  brain]
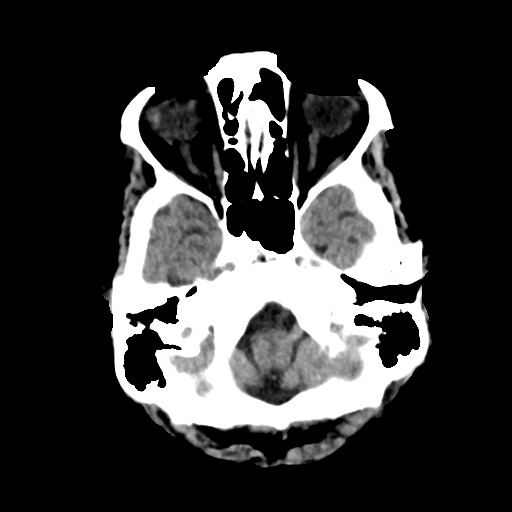
[im 4/29  bone]
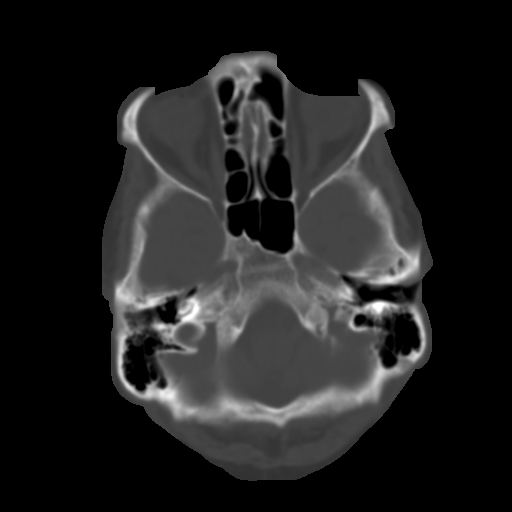
[im 8/29  brain]
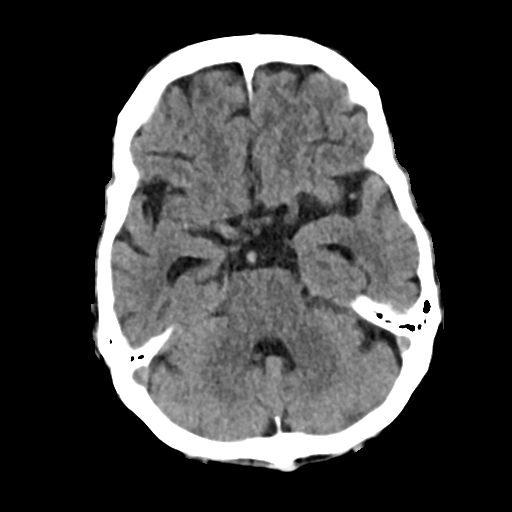
[im 11/29  brain]
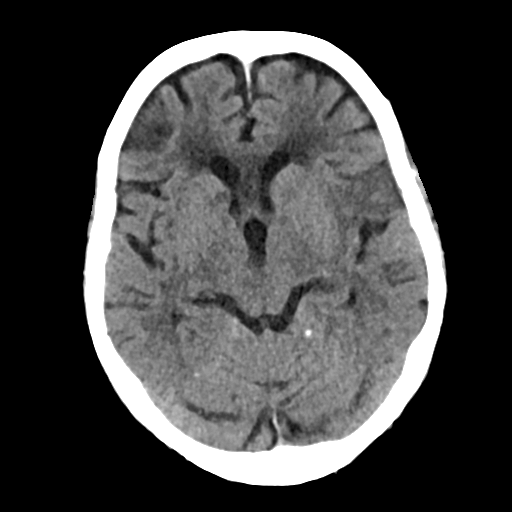
[im 15/29  brain]
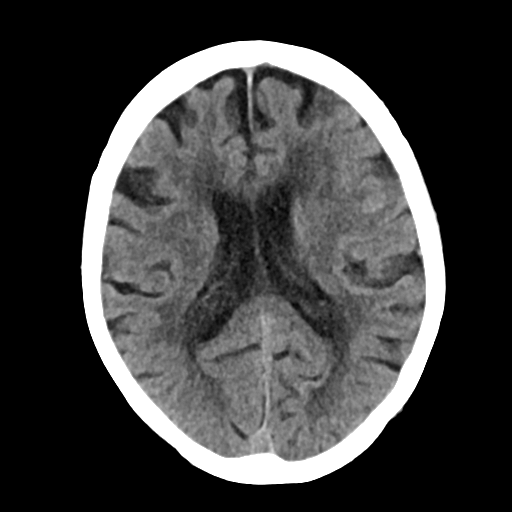
[im 18/29  brain]
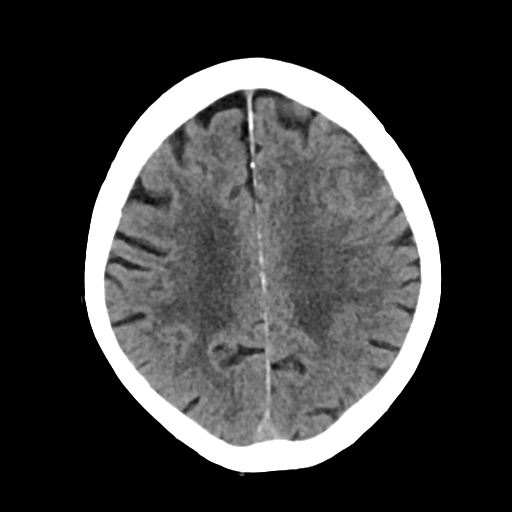
[im 18/29  bone]
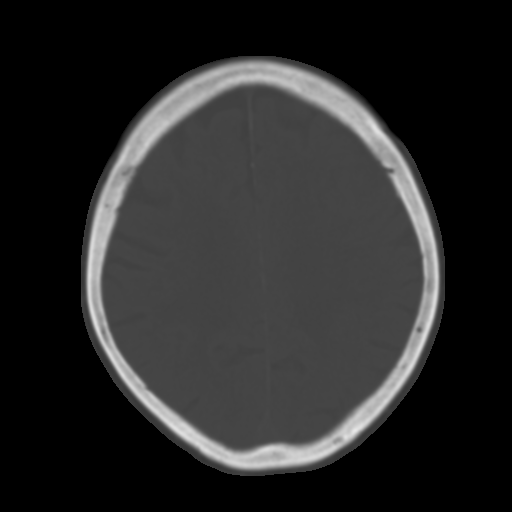
[im 22/29  brain]
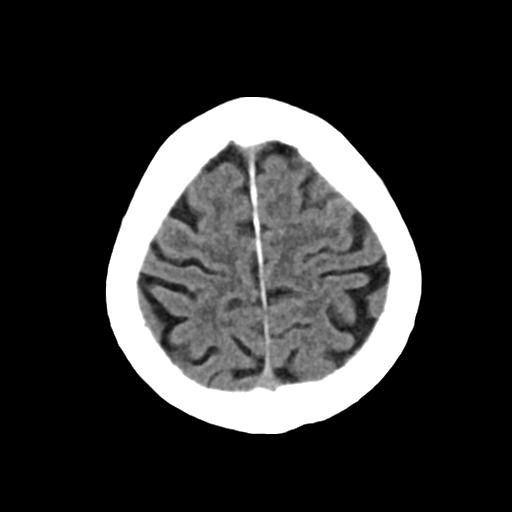
[im 25/29  brain]
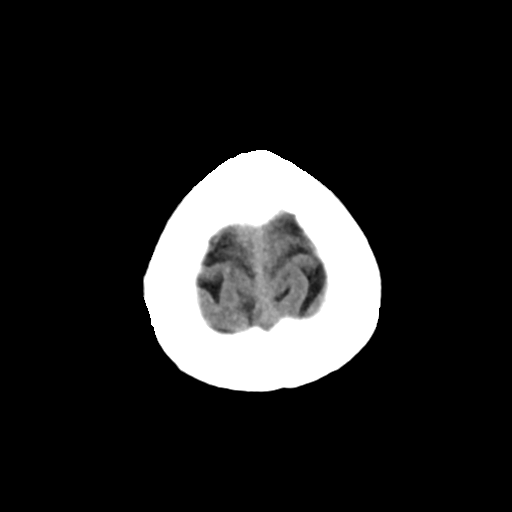

[Series 3: head bone · axial · 0.39mm/px · z∈[-130,-102]mm · 3 of 72 slices shown]
[im 8/72  bone]
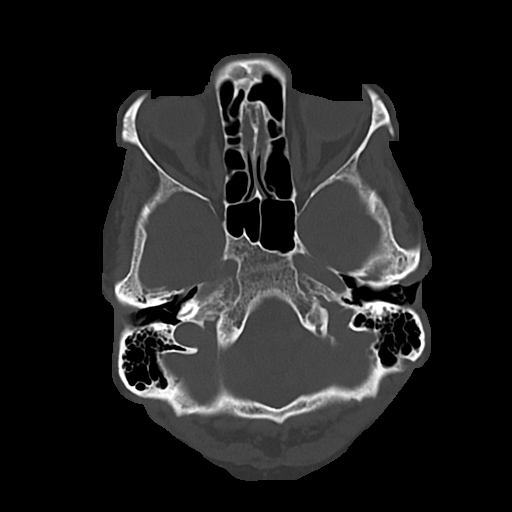
[im 15/72  bone]
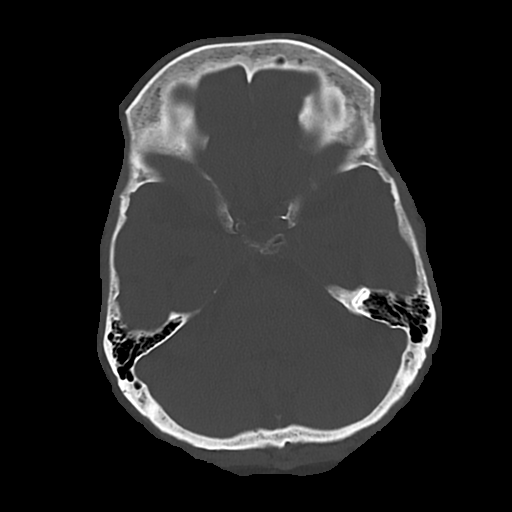
[im 22/72  bone]
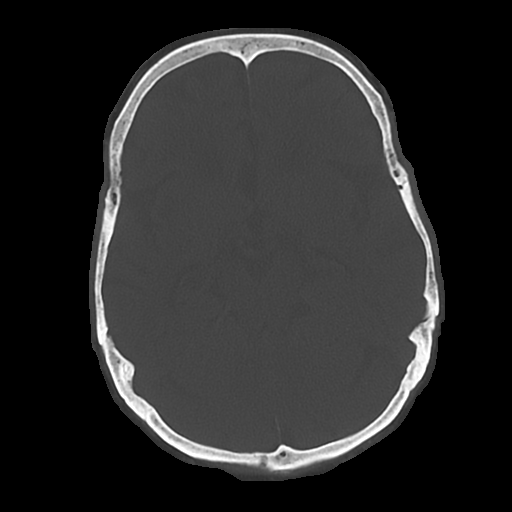

[Series 4: cor soft · coronal · 0.29mm/px · 3 of 63 slices shown]
[im 21/63  brain]
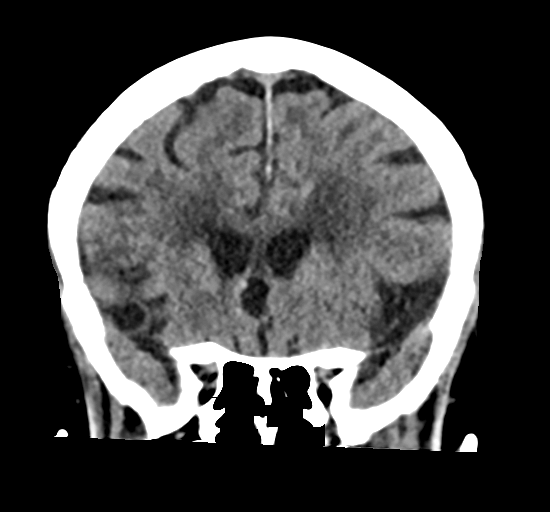
[im 28/63  brain]
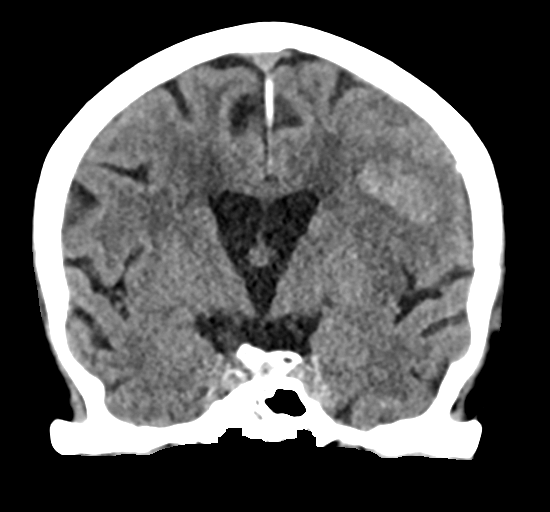
[im 35/63  brain]
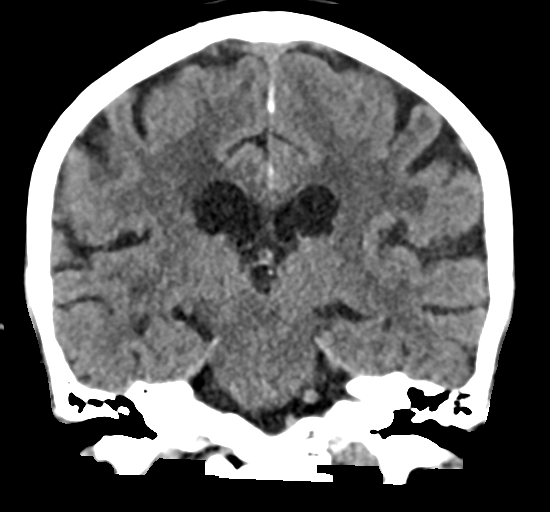

[Series 5: sag soft · sagittal · 0.29mm/px · 3 of 54 slices shown]
[im 18/54  brain]
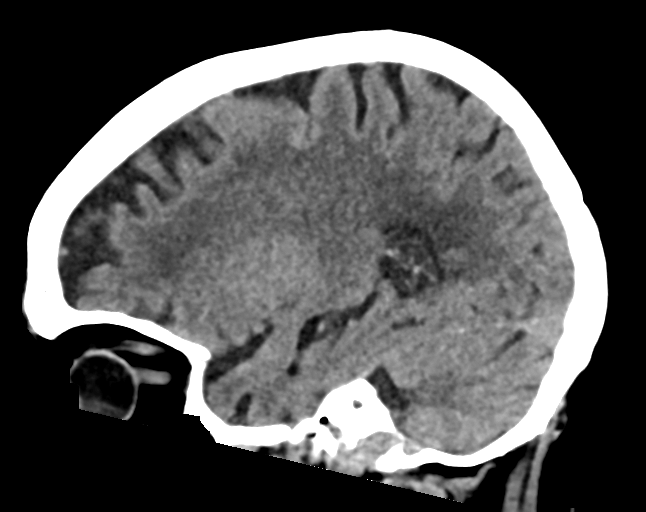
[im 27/54  brain]
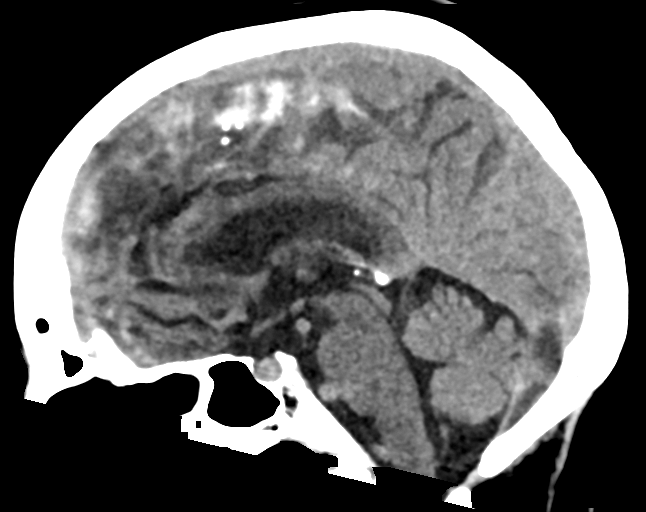
[im 36/54  brain]
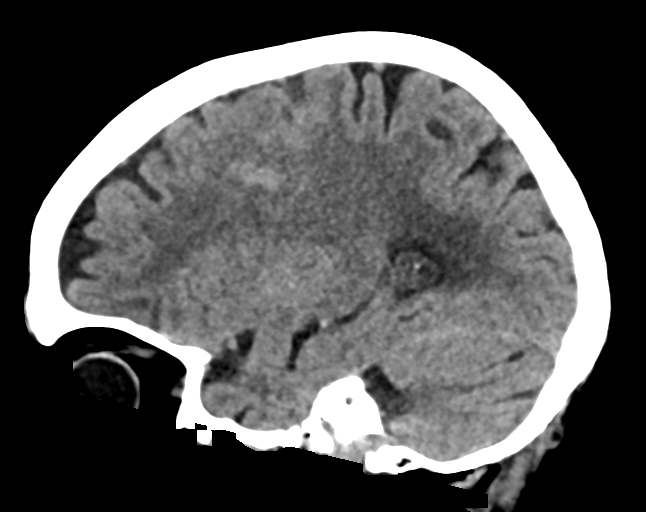

[16 of 47 positions shown; findings below may reference images not displayed]

FINDINGS: Brain: No significant change an petechial hemorrhage in the left
middle MCA territory, now most conspicuous on series 2, image 17.
Regional cytotoxic edema corresponding to abnormal diffusion.

No significant midline shift or intracranial mass effect. Stable
gray-white matter differentiation elsewhere. No extra-axial
hemorrhage.

Vascular: Calcified atherosclerosis at the skull base. Distal left
ICA 6 mm aneurysm occult by plain CT.

Skull: No acute osseous abnormality identified.

Sinuses/Orbits: Visualized paranasal sinuses and mastoids are clear.

Other: No acute orbit or scalp soft tissue finding.
IMPRESSION: 1. No significant change since 8964 hours in the left middle MCA
territory petechial hemorrhage and cytotoxic edema. No significant
mass effect.
2. No new intracranial abnormality. Distal left ICA 6 mm aneurysm
occult by plain CT.

## 2023-04-06 IMAGING — DX DG CHEST 1V PORT
1 series · 1 of 1 positions shown · non-contrast
Comparison: 06/28/2021

CLINICAL DATA: Fever, stroke

EXAM:
PORTABLE CHEST 1 VIEW

[chest ap]
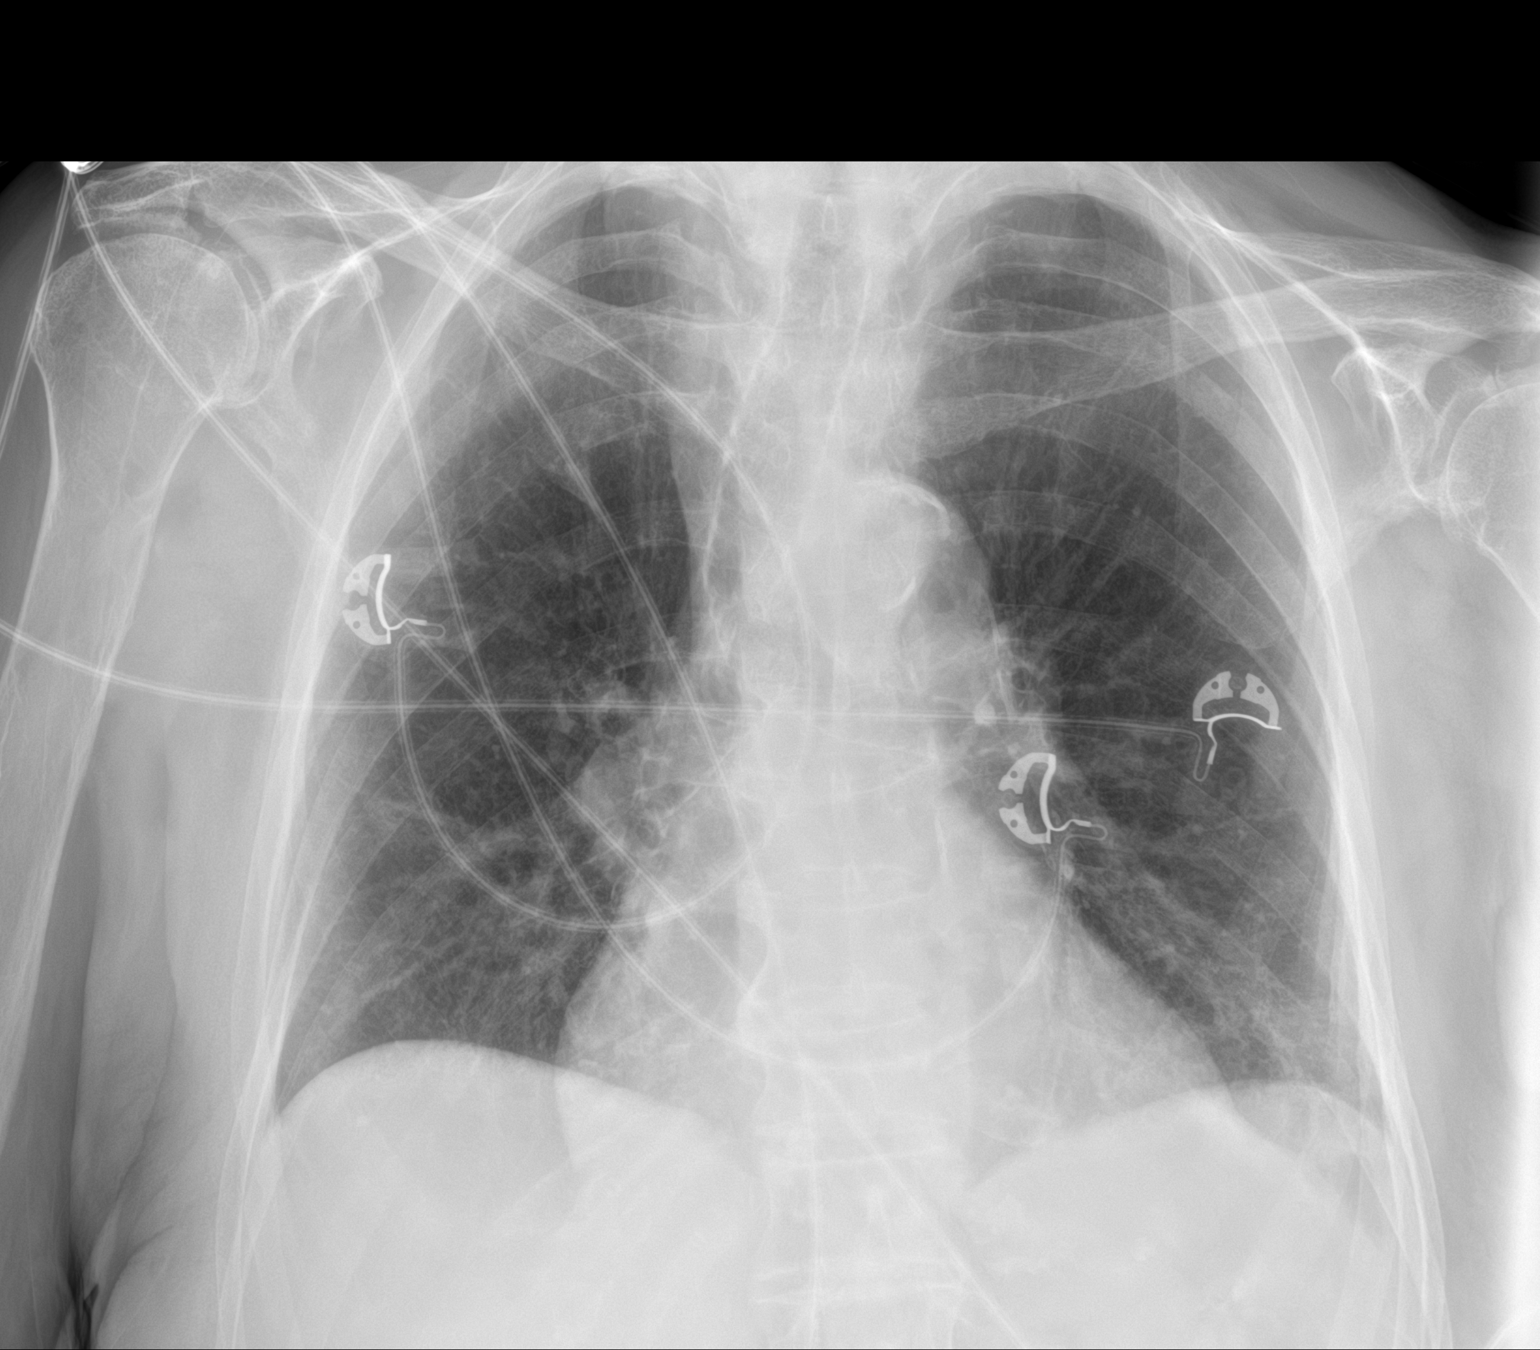

[1 of 1 positions shown; findings below may reference images not displayed]

FINDINGS: The heart size and mediastinal contours are within normal limits.
Both lungs are clear. The visualized skeletal structures are
unremarkable.
IMPRESSION: No acute abnormality of the lungs in AP portable projection.
